# Patient Record
Sex: Male | Born: 1975 | Race: White | Hispanic: No | Marital: Married
Health system: Southern US, Academic
[De-identification: ages and names within clinical notes are randomized; demographics above are authoritative.]

## PROBLEM LIST (undated history)

## (undated) ENCOUNTER — Encounter

## (undated) ENCOUNTER — Ambulatory Visit

## (undated) ENCOUNTER — Telehealth

## (undated) ENCOUNTER — Encounter: Attending: Nephrology | Primary: Nephrology

## (undated) ENCOUNTER — Ambulatory Visit
Payer: PRIVATE HEALTH INSURANCE | Attending: Student in an Organized Health Care Education/Training Program | Primary: Student in an Organized Health Care Education/Training Program

## (undated) ENCOUNTER — Ambulatory Visit: Payer: PRIVATE HEALTH INSURANCE | Attending: Nephrology | Primary: Nephrology

## (undated) ENCOUNTER — Ambulatory Visit: Payer: PRIVATE HEALTH INSURANCE

## (undated) ENCOUNTER — Encounter: Payer: BLUE CROSS/BLUE SHIELD | Attending: Surgery | Primary: Surgery

## (undated) ENCOUNTER — Encounter: Attending: Surgery | Primary: Surgery

## (undated) ENCOUNTER — Encounter: Payer: BLUE CROSS/BLUE SHIELD | Attending: Nephrology | Primary: Nephrology

## (undated) ENCOUNTER — Ambulatory Visit: Payer: PRIVATE HEALTH INSURANCE | Attending: Dermatology | Primary: Dermatology

## (undated) ENCOUNTER — Ambulatory Visit: Attending: Nephrology | Primary: Nephrology

## (undated) ENCOUNTER — Ambulatory Visit
Payer: BLUE CROSS/BLUE SHIELD | Attending: Student in an Organized Health Care Education/Training Program | Primary: Student in an Organized Health Care Education/Training Program

## (undated) ENCOUNTER — Ambulatory Visit: Payer: BLUE CROSS/BLUE SHIELD

## (undated) ENCOUNTER — Ambulatory Visit: Attending: Surgery | Primary: Surgery

## (undated) ENCOUNTER — Ambulatory Visit: Payer: PRIVATE HEALTH INSURANCE | Attending: Physician Assistant | Primary: Physician Assistant

## (undated) ENCOUNTER — Ambulatory Visit
Attending: Student in an Organized Health Care Education/Training Program | Primary: Student in an Organized Health Care Education/Training Program

## (undated) ENCOUNTER — Non-Acute Institutional Stay: Payer: PRIVATE HEALTH INSURANCE

## (undated) ENCOUNTER — Encounter: Attending: Family | Primary: Family

## (undated) ENCOUNTER — Ambulatory Visit: Payer: BLUE CROSS/BLUE SHIELD | Attending: Nephrology | Primary: Nephrology

## (undated) ENCOUNTER — Encounter: Payer: BLUE CROSS/BLUE SHIELD | Attending: Urology | Primary: Urology

## (undated) ENCOUNTER — Ambulatory Visit: Payer: BLUE CROSS/BLUE SHIELD | Attending: Surgery | Primary: Surgery

## (undated) ENCOUNTER — Telehealth: Attending: Family Medicine | Primary: Family Medicine

## (undated) ENCOUNTER — Ambulatory Visit: Payer: BLUE CROSS/BLUE SHIELD | Attending: Dermatology | Primary: Dermatology

## (undated) ENCOUNTER — Encounter
Attending: Student in an Organized Health Care Education/Training Program | Primary: Student in an Organized Health Care Education/Training Program

## (undated) ENCOUNTER — Ambulatory Visit: Payer: BLUE CROSS/BLUE SHIELD | Attending: Urology | Primary: Urology

## (undated) DIAGNOSIS — Z72820 Sleep deprivation: Secondary | ICD-10-CM

## (undated) DIAGNOSIS — U071 COVID-19: Secondary | ICD-10-CM

## (undated) DIAGNOSIS — Q612 Polycystic kidney, adult type: Secondary | ICD-10-CM

## (undated) DIAGNOSIS — I10 Essential (primary) hypertension: Secondary | ICD-10-CM

## (undated) DIAGNOSIS — N529 Male erectile dysfunction, unspecified: Secondary | ICD-10-CM

## (undated) DIAGNOSIS — N185 Chronic kidney disease, stage 5: Secondary | ICD-10-CM

## (undated) DIAGNOSIS — D649 Anemia, unspecified: Secondary | ICD-10-CM

## (undated) DIAGNOSIS — F419 Anxiety disorder, unspecified: Secondary | ICD-10-CM

## (undated) DIAGNOSIS — Z87442 Personal history of urinary calculi: Secondary | ICD-10-CM

## (undated) DIAGNOSIS — Q613 Polycystic kidney, unspecified: Secondary | ICD-10-CM

## (undated) DIAGNOSIS — K219 Gastro-esophageal reflux disease without esophagitis: Secondary | ICD-10-CM

## (undated) DIAGNOSIS — D849 Immunodeficiency, unspecified: Secondary | ICD-10-CM

## (undated) HISTORY — PX: AV FISTULA PLACEMENT, BRACHIOCEPHALIC: SHX1207

## (undated) HISTORY — DX: Polycystic kidney, unspecified: Q61.3

## (undated) HISTORY — PX: NEPHRECTOMY LIVING DONOR: SUR877

## (undated) HISTORY — DX: Essential (primary) hypertension: I10

## (undated) HISTORY — PX: SMALL INTESTINE SURGERY: SHX150

## (undated) HISTORY — PX: APPENDECTOMY: SHX54

---
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
## 1996-01-24 HISTORY — PX: SMALL INTESTINE SURGERY: SHX150

## 2003-11-05 ENCOUNTER — Ambulatory Visit: Payer: Self-pay | Admitting: Family Medicine

## 2004-03-01 ENCOUNTER — Ambulatory Visit: Payer: Self-pay | Admitting: Urology

## 2004-03-07 ENCOUNTER — Ambulatory Visit: Payer: Self-pay | Admitting: Specialist

## 2006-11-14 ENCOUNTER — Ambulatory Visit: Payer: Self-pay | Admitting: Urology

## 2012-07-23 ENCOUNTER — Ambulatory Visit: Payer: Self-pay | Admitting: Nephrology

## 2012-11-12 ENCOUNTER — Ambulatory Visit: Payer: Self-pay | Admitting: Nephrology

## 2012-11-22 DIAGNOSIS — N200 Calculus of kidney: Secondary | ICD-10-CM | POA: Insufficient documentation

## 2012-11-28 ENCOUNTER — Ambulatory Visit: Payer: Self-pay | Admitting: Nephrology

## 2013-02-21 DIAGNOSIS — K4090 Unilateral inguinal hernia, without obstruction or gangrene, not specified as recurrent: Secondary | ICD-10-CM | POA: Insufficient documentation

## 2015-01-04 DIAGNOSIS — Q612 Polycystic kidney, adult type: Secondary | ICD-10-CM | POA: Insufficient documentation

## 2015-01-04 DIAGNOSIS — N184 Chronic kidney disease, stage 4 (severe): Secondary | ICD-10-CM

## 2015-01-04 DIAGNOSIS — I10 Essential (primary) hypertension: Secondary | ICD-10-CM

## 2015-01-04 DIAGNOSIS — I1311 Hypertensive heart and chronic kidney disease without heart failure, with stage 5 chronic kidney disease, or end stage renal disease: Secondary | ICD-10-CM | POA: Insufficient documentation

## 2015-01-04 DIAGNOSIS — Q613 Polycystic kidney, unspecified: Secondary | ICD-10-CM

## 2015-01-04 DIAGNOSIS — N185 Chronic kidney disease, stage 5: Secondary | ICD-10-CM | POA: Insufficient documentation

## 2015-01-04 DIAGNOSIS — N189 Chronic kidney disease, unspecified: Secondary | ICD-10-CM

## 2015-01-04 HISTORY — DX: Chronic kidney disease, unspecified: N18.9

## 2015-01-05 ENCOUNTER — Ambulatory Visit (INDEPENDENT_AMBULATORY_CARE_PROVIDER_SITE_OTHER): Payer: Managed Care, Other (non HMO) | Admitting: Unknown Physician Specialty

## 2015-01-05 ENCOUNTER — Encounter: Payer: Self-pay | Admitting: Unknown Physician Specialty

## 2015-01-05 VITALS — BP 123/83 | HR 65 | Temp 98.2°F | Ht 72.0 in | Wt 242.2 lb

## 2015-01-05 DIAGNOSIS — Q613 Polycystic kidney, unspecified: Secondary | ICD-10-CM

## 2015-01-05 DIAGNOSIS — Z0000 Encounter for general adult medical examination without abnormal findings: Secondary | ICD-10-CM | POA: Diagnosis not present

## 2015-01-05 DIAGNOSIS — I10 Essential (primary) hypertension: Secondary | ICD-10-CM | POA: Diagnosis not present

## 2015-01-05 DIAGNOSIS — N184 Chronic kidney disease, stage 4 (severe): Secondary | ICD-10-CM | POA: Diagnosis not present

## 2015-01-05 NOTE — Assessment & Plan Note (Signed)
Per nephrology 

## 2015-01-05 NOTE — Assessment & Plan Note (Signed)
Stable, continue present medications.   

## 2015-01-05 NOTE — Progress Notes (Signed)
   BP 123/83 mmHg  Pulse 65  Temp(Src) 98.2 F (36.8 C)  Ht 6' (1.829 m)  Wt 242 lb 3.2 oz (109.861 kg)  BMI 32.84 kg/m2  SpO2 98%   Subjective:    Patient ID: Scott Hickman, male    DOB: [birth year], 39 y.o.   MRN: [id number]  HPI: Scott Hickman is a 39 y.o. male  Chief Complaint  Patient presents with  . Annual Exam   Needs a mandatory physical for work.    Relevant past medical, surgical, family and social history reviewed and updated as indicated. Interim medical history since our last visit reviewed. Allergies and medications reviewed and updated.  Review of Systems  Constitutional: Negative.   HENT: Negative.   Eyes: Negative.   Respiratory: Negative.   Cardiovascular: Negative.   Gastrointestinal: Negative.   Endocrine: Negative.   Genitourinary: Negative.   Skin: Negative.   Allergic/Immunologic: Negative.   Neurological: Negative.   Hematological: Negative.   Psychiatric/Behavioral: Negative.     Per HPI unless specifically indicated above     Objective:    BP 123/83 mmHg  Pulse 65  Temp(Src) 98.2 F (36.8 C)  Ht 6' (1.829 m)  Wt 242 lb 3.2 oz (109.861 kg)  BMI 32.84 kg/m2  SpO2 98%  Wt Readings from Last 3 Encounters:  01/05/15 242 lb 3.2 oz (109.861 kg)  12/04/13 235 lb (106.595 kg)    Physical Exam  Constitutional: He is oriented to person, place, and time. He appears well-developed and well-nourished.  HENT:  Head: Normocephalic.  Right Ear: Tympanic membrane, external ear and ear canal normal.  Left Ear: Tympanic membrane, external ear and ear canal normal.  Mouth/Throat: Uvula is midline, oropharynx is clear and moist and mucous membranes are normal.  Eyes: Pupils are equal, round, and reactive to light.  Cardiovascular: Normal rate, regular rhythm and normal heart sounds.  Exam reveals no gallop and no friction rub.   No murmur heard. Pulmonary/Chest: Effort normal and breath sounds normal. No respiratory distress.  Abdominal: Soft.  Bowel sounds are normal. He exhibits no distension. There is no tenderness.  Musculoskeletal: Normal range of motion.  Neurological: He is alert and oriented to person, place, and time. He has normal reflexes.  Skin: Skin is warm and dry.  Psychiatric: He has a normal mood and affect. His behavior is normal. Judgment and thought content normal.    No results found for this or any previous visit.    Assessment & Plan:   Problem List Items Addressed This Visit      Unprioritized   Polycystic kidney    Per nephrology      Hypertension    Stable, continue present medications.        Chronic kidney disease (CKD), stage IV (severe) [facility])    Per nephrology       Other Visit Diagnoses    Annual physical exam    -  Primary    Relevant Orders    TSH    Lipid Panel w/o Chol/HDL Ratio    CBC       Nephrologist following BP and kidney function  Follow up plan: Return in about 1 year (around 01/05/2016).

## 2015-01-06 ENCOUNTER — Encounter: Payer: Self-pay | Admitting: Unknown Physician Specialty

## 2015-01-06 LAB — CBC
HEMATOCRIT: 40 % (ref 37.5–51.0)
Hemoglobin: 13.3 g/dL (ref 12.6–17.7)
MCH: 26.9 pg (ref 26.6–33.0)
MCHC: 33.3 g/dL (ref 31.5–35.7)
MCV: 81 fL (ref 79–97)
PLATELETS: 245 10*3/uL (ref 150–379)
RBC: 4.94 x10E6/uL (ref 4.14–5.80)
RDW: 14.2 % (ref 12.3–15.4)
WBC: 8.4 10*3/uL (ref 3.4–10.8)

## 2015-01-06 LAB — LIPID PANEL W/O CHOL/HDL RATIO
Cholesterol, Total: 156 mg/dL (ref 100–199)
HDL: 36 mg/dL — ABNORMAL LOW (ref >39)
LDL CALC: 93 mg/dL (ref 0–99)
Triglycerides: 136 mg/dL (ref 0–149)
VLDL Cholesterol Cal: 27 mg/dL (ref 5–40)

## 2015-01-06 LAB — TSH: TSH: 1.19 u[IU]/mL (ref 0.450–4.500)

## 2015-03-02 ENCOUNTER — Ambulatory Visit: Payer: Managed Care, Other (non HMO) | Admitting: Unknown Physician Specialty

## 2016-01-06 ENCOUNTER — Encounter: Payer: Self-pay | Admitting: Family Medicine

## 2016-01-06 ENCOUNTER — Ambulatory Visit (INDEPENDENT_AMBULATORY_CARE_PROVIDER_SITE_OTHER): Payer: Managed Care, Other (non HMO) | Admitting: Family Medicine

## 2016-01-06 VITALS — BP 145/91 | HR 85 | Temp 98.1°F | Ht 71.5 in | Wt 247.0 lb

## 2016-01-06 DIAGNOSIS — Z0000 Encounter for general adult medical examination without abnormal findings: Secondary | ICD-10-CM

## 2016-01-06 DIAGNOSIS — N184 Chronic kidney disease, stage 4 (severe): Secondary | ICD-10-CM | POA: Diagnosis not present

## 2016-01-06 DIAGNOSIS — I10 Essential (primary) hypertension: Secondary | ICD-10-CM | POA: Diagnosis not present

## 2016-01-06 DIAGNOSIS — Z0001 Encounter for general adult medical examination with abnormal findings: Secondary | ICD-10-CM | POA: Diagnosis not present

## 2016-01-06 NOTE — Assessment & Plan Note (Signed)
Followed by [facility]

## 2016-01-06 NOTE — Assessment & Plan Note (Signed)
Blood pressures elevated here again patient will check blood pressure at home as has appointments follow-up at [facility] in early next year.

## 2016-01-06 NOTE — Progress Notes (Addendum)
BP (!) 145/91 (BP Location: Left Arm, Patient Position: Sitting, Cuff Size: Normal)   Pulse 85   Temp 98.1 F (36.7 C)   Ht 5' 11.5" (1.816 m)   Wt 247 lb (112 kg)   SpO2 99%   BMI 33.97 kg/m    Subjective:    Patient ID: Scott Hickman, male    DOB: Feb 17, 1975, 40 y.o.   MRN: [id number]  HPI: Scott Hickman is a 40 y.o. male  Chief Complaint  Patient presents with  . Annual Exam  Patient follow-up [facility] probably will be on transplant list on blood pressure has been up and down was very low and stopped metoprolol earlier this summer blood pressures been okay on home monitoring but hasn't checked lately he has been good.  Relevant past medical, surgical, family and social history reviewed and updated as indicated. Interim medical history since our last visit reviewed. Allergies and medications reviewed and updated.  Review of Systems  Constitutional: Negative.   HENT: Negative.   Eyes: Negative.   Respiratory: Negative.   Cardiovascular: Negative.   Gastrointestinal: Negative.   Endocrine: Negative.   Genitourinary: Negative.   Musculoskeletal: Negative.   Skin: Negative.   Allergic/Immunologic: Negative.   Neurological: Negative.   Hematological: Negative.   Psychiatric/Behavioral: Negative.     Per HPI unless specifically indicated above     Objective:    BP (!) 145/91 (BP Location: Left Arm, Patient Position: Sitting, Cuff Size: Normal)   Pulse 85   Temp 98.1 F (36.7 C)   Ht 5' 11.5" (1.816 m)   Wt 247 lb (112 kg)   SpO2 99%   BMI 33.97 kg/m   Wt Readings from Last 3 Encounters:  01/06/16 247 lb (112 kg)  01/05/15 242 lb 3.2 oz (109.9 kg)  12/04/13 235 lb (106.6 kg)    Physical Exam  Constitutional: He is oriented to person, place, and time. He appears well-developed and well-nourished.  HENT:  Head: Normocephalic and atraumatic.  Right Ear: External ear normal.  Left Ear: External ear normal.  Eyes: Conjunctivae and EOM are normal. Pupils are  equal, round, and reactive to light.  Neck: Normal range of motion. Neck supple.  Cardiovascular: Normal rate, regular rhythm, normal heart sounds and intact distal pulses.   Pulmonary/Chest: Effort normal and breath sounds normal.  Abdominal: Soft. Bowel sounds are normal. There is no splenomegaly or hepatomegaly.  Genitourinary: Rectum normal, prostate normal and penis normal.  Musculoskeletal: Normal range of motion.  Neurological: He is alert and oriented to person, place, and time. He has normal reflexes.  Skin: No rash noted. No erythema.  Psychiatric: He has a normal mood and affect. His behavior is normal. Judgment and thought content normal.    Results for orders placed or performed in visit on 01/05/15  TSH  Result Value Ref Range   TSH 1.190 0.450 - 4.500 uIU/mL  Lipid Panel w/o Chol/HDL Ratio  Result Value Ref Range   Cholesterol, Total 156 100 - 199 mg/dL   Triglycerides 136 0 - 149 mg/dL   HDL 36 (L) >39 mg/dL   VLDL Cholesterol Cal 27 5 - 40 mg/dL   LDL Calculated 93 0 - 99 mg/dL  CBC  Result Value Ref Range   WBC 8.4 3.4 - 10.8 x10E3/uL   RBC 4.94 4.14 - 5.80 x10E6/uL   Hemoglobin 13.3 12.6 - 17.7 g/dL   Hematocrit 40.0 37.5 - 51.0 %   MCV 81 79 - 97 fL   MCH  26.9 26.6 - 33.0 pg   MCHC 33.3 31.5 - 35.7 g/dL   RDW 14.2 12.3 - 15.4 %   Platelets 245 150 - 379 x10E3/uL      Assessment & Plan:   Problem List Items Addressed This Visit      Cardiovascular and Mediastinum   Hypertension    Blood pressures elevated here again patient will check blood pressure at home as has appointments follow-up at [facility] in early next year.        Genitourinary   Chronic kidney disease (CKD), stage IV (severe) ([place])    Followed by [facility]       Other Visit Diagnoses    PE (physical exam), annual    -  Primary   Relevant Orders   Comprehensive metabolic panel   Lipid panel   CBC with Differential/Platelet   TSH       Follow up plan: Return in about 1 year (around  01/05/2017) for Physical Exam.

## 2016-01-07 LAB — COMPREHENSIVE METABOLIC PANEL
A/G RATIO: 1.6 (ref 1.2–2.2)
ALK PHOS: 86 IU/L (ref 39–117)
ALT: 23 IU/L (ref 0–44)
AST: 14 IU/L (ref 0–40)
Albumin: 4.5 g/dL (ref 3.5–5.5)
BILIRUBIN TOTAL: 0.2 mg/dL (ref 0.0–1.2)
BUN/Creatinine Ratio: 10 (ref 9–20)
BUN: 37 mg/dL — ABNORMAL HIGH (ref 6–24)
CALCIUM: 9 mg/dL (ref 8.7–10.2)
CHLORIDE: 107 mmol/L — AB (ref 96–106)
CO2: 20 mmol/L (ref 18–29)
Creatinine, Ser: 3.66 mg/dL — ABNORMAL HIGH (ref 0.76–1.27)
GFR calc Af Amer: 23 mL/min/{1.73_m2} — ABNORMAL LOW (ref >59)
GFR calc non Af Amer: 20 mL/min/{1.73_m2} — ABNORMAL LOW (ref >59)
Globulin, Total: 2.8 g/dL (ref 1.5–4.5)
Glucose: 101 mg/dL — ABNORMAL HIGH (ref 65–99)
POTASSIUM: 4.9 mmol/L (ref 3.5–5.2)
SODIUM: 143 mmol/L (ref 134–144)
Total Protein: 7.3 g/dL (ref 6.0–8.5)

## 2016-01-07 LAB — CBC WITH DIFFERENTIAL/PLATELET
Basophils Absolute: 0 10*3/uL (ref 0.0–0.2)
Basos: 0 %
EOS (ABSOLUTE): 0.2 10*3/uL (ref 0.0–0.4)
EOS: 3 %
HEMATOCRIT: 38.4 % (ref 37.5–51.0)
HEMOGLOBIN: 12.3 g/dL — AB (ref 13.0–17.7)
IMMATURE GRANS (ABS): 0 10*3/uL (ref 0.0–0.1)
Immature Granulocytes: 0 %
LYMPHS ABS: 2.2 10*3/uL (ref 0.7–3.1)
LYMPHS: 27 %
MCH: 26.7 pg (ref 26.6–33.0)
MCHC: 32 g/dL (ref 31.5–35.7)
MCV: 84 fL (ref 79–97)
MONOCYTES: 6 %
Monocytes Absolute: 0.5 10*3/uL (ref 0.1–0.9)
NEUTROS ABS: 5.1 10*3/uL (ref 1.4–7.0)
Neutrophils: 64 %
Platelets: 245 10*3/uL (ref 150–379)
RBC: 4.6 x10E6/uL (ref 4.14–5.80)
RDW: 14.3 % (ref 12.3–15.4)
WBC: 8 10*3/uL (ref 3.4–10.8)

## 2016-01-07 LAB — LIPID PANEL
CHOLESTEROL TOTAL: 168 mg/dL (ref 100–199)
Chol/HDL Ratio: 3.7 ratio units (ref 0.0–5.0)
HDL: 45 mg/dL (ref >39)
LDL Calculated: 103 mg/dL — ABNORMAL HIGH (ref 0–99)
TRIGLYCERIDES: 98 mg/dL (ref 0–149)
VLDL Cholesterol Cal: 20 mg/dL (ref 5–40)

## 2016-01-07 LAB — TSH: TSH: 2.27 u[IU]/mL (ref 0.450–4.500)

## 2016-01-10 ENCOUNTER — Encounter: Payer: Self-pay | Admitting: Family Medicine

## 2016-02-04 ENCOUNTER — Telehealth: Payer: Self-pay | Admitting: Family Medicine

## 2016-02-04 DIAGNOSIS — N185 Chronic kidney disease, stage 5: Secondary | ICD-10-CM

## 2016-02-04 NOTE — Telephone Encounter (Signed)
Patient's wife Claiborne Billings) states that her husband Scott Hickman needs a TB skin test in order to be added to a kidney transplant list. Patient's wife would like to know if this requires an office visit or if you can just put in an order for the patient to have the lab test?  Please call to advise.

## 2016-02-07 NOTE — Telephone Encounter (Signed)
Pt had annual exam done on 01/06/16. Please advise.

## 2016-02-07 NOTE — Telephone Encounter (Signed)
New appointment needed orders placed patient just needs to come in for labs.

## 2016-02-07 NOTE — Telephone Encounter (Signed)
Wife aware. Lab appointment made.

## 2016-02-08 ENCOUNTER — Other Ambulatory Visit: Payer: Managed Care, Other (non HMO)

## 2016-02-08 ENCOUNTER — Encounter: Payer: Self-pay | Admitting: Family Medicine

## 2016-02-08 DIAGNOSIS — N185 Chronic kidney disease, stage 5: Secondary | ICD-10-CM

## 2016-02-11 ENCOUNTER — Encounter: Payer: Self-pay | Admitting: Family Medicine

## 2016-02-11 LAB — QUANTIFERON IN TUBE
QFT TB AG MINUS NIL VALUE: <0 IU/mL
QUANTIFERON MITOGEN VALUE: 7.73 [IU]/mL
QUANTIFERON NIL VALUE: 0.04 [IU]/mL
QUANTIFERON TB AG VALUE: 0.03 [IU]/mL
QUANTIFERON TB GOLD: NEGATIVE

## 2016-02-11 LAB — QUANTIFERON TB GOLD ASSAY (BLOOD)

## 2016-07-24 ENCOUNTER — Ambulatory Visit
Admission: RE | Admit: 2016-07-24 | Discharge: 2016-07-24 | Disposition: A | Discharge status: Home / Self Care | Payer: PRIVATE HEALTH INSURANCE | Attending: Nephrology | Admitting: Nephrology

## 2016-07-24 DIAGNOSIS — E872 Acidosis: Secondary | ICD-10-CM

## 2016-07-24 DIAGNOSIS — N185 Chronic kidney disease, stage 5: Secondary | ICD-10-CM

## 2016-07-24 DIAGNOSIS — N2581 Secondary hyperparathyroidism of renal origin: Secondary | ICD-10-CM

## 2016-07-24 DIAGNOSIS — Q612 Polycystic kidney, adult type: Principal | ICD-10-CM

## 2016-07-24 DIAGNOSIS — I10 Essential (primary) hypertension: Secondary | ICD-10-CM

## 2016-09-21 ENCOUNTER — Ambulatory Visit
Admission: RE | Admit: 2016-09-21 | Discharge: 2016-09-21 | Disposition: A | Discharge status: Home / Self Care | Payer: Commercial Managed Care - PPO

## 2016-09-21 DIAGNOSIS — N2581 Secondary hyperparathyroidism of renal origin: Secondary | ICD-10-CM

## 2016-09-21 DIAGNOSIS — Z7682 Awaiting organ transplant status: Secondary | ICD-10-CM

## 2016-09-21 DIAGNOSIS — N185 Chronic kidney disease, stage 5: Principal | ICD-10-CM

## 2016-09-27 ENCOUNTER — Ambulatory Visit
Admission: RE | Admit: 2016-09-27 | Discharge: 2016-09-27 | Disposition: A | Discharge status: Home / Self Care | Payer: Commercial Managed Care - PPO

## 2016-09-27 DIAGNOSIS — N185 Chronic kidney disease, stage 5: Principal | ICD-10-CM

## 2016-11-17 ENCOUNTER — Ambulatory Visit
Admission: RE | Admit: 2016-11-17 | Discharge: 2016-11-17 | Disposition: A | Discharge status: Home / Self Care | Payer: Commercial Managed Care - PPO

## 2016-11-17 DIAGNOSIS — I10 Essential (primary) hypertension: Secondary | ICD-10-CM

## 2016-11-17 DIAGNOSIS — Q612 Polycystic kidney, adult type: Secondary | ICD-10-CM

## 2016-11-17 DIAGNOSIS — N185 Chronic kidney disease, stage 5: Principal | ICD-10-CM

## 2016-11-20 ENCOUNTER — Ambulatory Visit
Admission: RE | Admit: 2016-11-20 | Discharge: 2016-11-20 | Payer: Commercial Managed Care - PPO | Attending: Nephrology | Admitting: Nephrology

## 2016-11-20 DIAGNOSIS — I10 Essential (primary) hypertension: Principal | ICD-10-CM

## 2016-11-20 DIAGNOSIS — Q612 Polycystic kidney, adult type: Secondary | ICD-10-CM

## 2016-11-20 DIAGNOSIS — E872 Acidosis: Secondary | ICD-10-CM

## 2016-11-20 DIAGNOSIS — N185 Chronic kidney disease, stage 5: Secondary | ICD-10-CM

## 2016-11-20 DIAGNOSIS — N2581 Secondary hyperparathyroidism of renal origin: Secondary | ICD-10-CM

## 2016-11-20 MED ORDER — AMLODIPINE 10 MG TABLET
ORAL_TABLET | Freq: Every day | ORAL | 4 refills | 0.00000 days | Status: CP
Start: 2016-11-20 — End: 2018-02-12

## 2016-11-20 MED ORDER — CARVEDILOL 3.125 MG TABLET
ORAL_TABLET | Freq: Two times a day (BID) | ORAL | 3 refills | 0 days | Status: CP
Start: 2016-11-20 — End: 2017-06-14

## 2016-11-20 MED ORDER — CALCITRIOL 0.25 MCG CAPSULE
ORAL_CAPSULE | ORAL | 11 refills | 0 days | Status: CP
Start: 2016-11-20 — End: 2017-03-22

## 2016-11-20 MED ORDER — SODIUM BICARBONATE 650 MG TABLET
ORAL_TABLET | Freq: Two times a day (BID) | ORAL | 3 refills | 0.00000 days | Status: CP
Start: 2016-11-20 — End: 2017-12-05

## 2016-12-21 ENCOUNTER — Ambulatory Visit
Admission: RE | Admit: 2016-12-21 | Discharge: 2016-12-21 | Disposition: A | Discharge status: Home / Self Care | Payer: Commercial Managed Care - PPO

## 2016-12-21 DIAGNOSIS — N185 Chronic kidney disease, stage 5: Principal | ICD-10-CM

## 2016-12-28 ENCOUNTER — Ambulatory Visit
Admission: RE | Admit: 2016-12-28 | Discharge: 2016-12-28 | Payer: Commercial Managed Care - PPO | Attending: Nephrology | Admitting: Nephrology

## 2016-12-28 DIAGNOSIS — D631 Anemia in chronic kidney disease: Secondary | ICD-10-CM

## 2016-12-28 DIAGNOSIS — Q612 Polycystic kidney, adult type: Secondary | ICD-10-CM

## 2016-12-28 DIAGNOSIS — I10 Essential (primary) hypertension: Principal | ICD-10-CM

## 2016-12-28 DIAGNOSIS — E872 Acidosis: Secondary | ICD-10-CM

## 2016-12-28 DIAGNOSIS — N185 Chronic kidney disease, stage 5: Secondary | ICD-10-CM

## 2016-12-28 DIAGNOSIS — N189 Chronic kidney disease, unspecified: Secondary | ICD-10-CM

## 2017-01-11 ENCOUNTER — Encounter: Payer: Managed Care, Other (non HMO) | Admitting: Family Medicine

## 2017-02-09 ENCOUNTER — Encounter: Admit: 2017-02-09 | Discharge: 2017-02-10 | Payer: BLUE CROSS/BLUE SHIELD

## 2017-02-09 DIAGNOSIS — N185 Chronic kidney disease, stage 5: Principal | ICD-10-CM

## 2017-02-14 ENCOUNTER — Encounter: Admit: 2017-02-14 | Discharge: 2017-02-14 | Payer: BLUE CROSS/BLUE SHIELD | Attending: Nephrology | Primary: Nephrology

## 2017-02-14 DIAGNOSIS — Z01818 Encounter for other preprocedural examination: Principal | ICD-10-CM

## 2017-02-15 ENCOUNTER — Encounter: Admit: 2017-02-15 | Discharge: 2017-02-16 | Payer: BLUE CROSS/BLUE SHIELD | Attending: Nephrology | Primary: Nephrology

## 2017-02-15 DIAGNOSIS — N185 Chronic kidney disease, stage 5: Principal | ICD-10-CM

## 2017-03-02 MED ORDER — ISOSORBIDE MONONITRATE ER 30 MG TABLET,EXTENDED RELEASE 24 HR
ORAL_TABLET | Freq: Every day | ORAL | 11 refills | 0 days | Status: CP
Start: 2017-03-02 — End: 2017-03-22

## 2017-03-15 ENCOUNTER — Encounter: Admit: 2017-03-15 | Discharge: 2017-03-16 | Payer: BLUE CROSS/BLUE SHIELD

## 2017-03-15 DIAGNOSIS — N185 Chronic kidney disease, stage 5: Principal | ICD-10-CM

## 2017-03-22 ENCOUNTER — Encounter: Admit: 2017-03-22 | Discharge: 2017-03-23 | Payer: BLUE CROSS/BLUE SHIELD | Attending: Nephrology | Primary: Nephrology

## 2017-03-22 DIAGNOSIS — I150 Renovascular hypertension: Secondary | ICD-10-CM

## 2017-03-22 DIAGNOSIS — E872 Acidosis: Secondary | ICD-10-CM

## 2017-03-22 DIAGNOSIS — N185 Chronic kidney disease, stage 5: Secondary | ICD-10-CM

## 2017-03-22 DIAGNOSIS — N2581 Secondary hyperparathyroidism of renal origin: Principal | ICD-10-CM

## 2017-03-22 DIAGNOSIS — I10 Essential (primary) hypertension: Secondary | ICD-10-CM

## 2017-03-22 MED ORDER — HYDRALAZINE 25 MG TABLET
ORAL_TABLET | Freq: Three times a day (TID) | ORAL | 11 refills | 0 days | Status: CP
Start: 2017-03-22 — End: 2017-06-14

## 2017-03-22 MED ORDER — CALCITRIOL 0.25 MCG CAPSULE
ORAL_CAPSULE | Freq: Every day | ORAL | 11 refills | 0 days | Status: CP
Start: 2017-03-22 — End: 2017-10-18

## 2017-04-05 ENCOUNTER — Encounter: Admit: 2017-04-05 | Discharge: 2017-04-06 | Payer: BLUE CROSS/BLUE SHIELD | Attending: Surgery | Primary: Surgery

## 2017-04-05 DIAGNOSIS — N186 End stage renal disease: Secondary | ICD-10-CM

## 2017-04-05 DIAGNOSIS — Z7289 Other problems related to lifestyle: Secondary | ICD-10-CM

## 2017-04-05 DIAGNOSIS — Z125 Encounter for screening for malignant neoplasm of prostate: Secondary | ICD-10-CM

## 2017-04-05 DIAGNOSIS — Z01810 Encounter for preprocedural cardiovascular examination: Secondary | ICD-10-CM

## 2017-04-05 DIAGNOSIS — Z01818 Encounter for other preprocedural examination: Principal | ICD-10-CM

## 2017-04-16 ENCOUNTER — Ambulatory Visit (INDEPENDENT_AMBULATORY_CARE_PROVIDER_SITE_OTHER): Payer: PRIVATE HEALTH INSURANCE

## 2017-04-16 ENCOUNTER — Ambulatory Visit
Admission: EM | Admit: 2017-04-16 | Discharge: 2017-04-16 | Disposition: A | Discharge status: Home / Self Care | Payer: PRIVATE HEALTH INSURANCE | Attending: Family Medicine | Admitting: Family Medicine

## 2017-04-16 ENCOUNTER — Encounter: Payer: Self-pay | Admitting: Emergency Medicine

## 2017-04-16 ENCOUNTER — Other Ambulatory Visit: Payer: Self-pay

## 2017-04-16 DIAGNOSIS — M7021 Olecranon bursitis, right elbow: Secondary | ICD-10-CM

## 2017-04-16 MED ORDER — PREDNISONE 10 MG (21) PO TBPK: ORAL_TABLET | Freq: Every day | ORAL | 0 refills | Status: DC

## 2017-04-16 NOTE — Discharge Instructions (Signed)
Prednisone as prescribed. ° °Take care ° °Dr. Damel Querry  °

## 2017-04-16 NOTE — ED Triage Notes (Signed)
Patient c/o right elbow pain and swelling for the past 2 weeks.

## 2017-04-16 NOTE — ED Provider Notes (Signed)
[facility]    CSN: [id number] Arrival date & time: 04/16/17  4098  History   Chief Complaint Chief Complaint  Patient presents with  . Elbow Pain   HPI  42 year old male presents with right elbow pain and swelling.  Patient reports a 2-week history of right elbow pain and swelling.  Pain is minimal currently.  He is primarily bothered by the swelling.  She was doing some work recently and felt a pop.  He does not recall any trauma.  Continues to have swelling.  No redness.  No fever.  No other associated symptoms.  Patient has not taken any medication as he has polycystic kidney disease.  He cannot take anti-inflammatories.  No other associated symptoms.  No other complaints or concerns at this time.  Past Medical History:  Diagnosis Date  . Hypertension   . Polycystic kidney     Patient Active Problem List   Diagnosis Date Noted  . Polycystic kidney 01/04/2015  . Hypertension 01/04/2015  . Chronic kidney disease (CKD), stage IV (severe) ([place]) 01/04/2015    Past Surgical History:  Procedure Laterality Date  . APPENDECTOMY    . SMALL INTESTINE SURGERY      Home Medications    Prior to Admission medications   Medication Sig Start Date End Date Taking? Authorizing Provider  amLODipine (NORVASC) 10 MG tablet Take 10 mg by mouth daily.   Yes [provider]  carvedilol (COREG) 3.125 MG tablet Take 1 tablet by mouth 2 (two) times daily. 02/21/17  Yes [provider]  carvedilol (COREG) 3.125 MG tablet Take 3.125 mg by mouth. 11/20/16 11/20/17 Yes [provider]  citalopram (CELEXA) 20 MG tablet Take 20 mg by mouth daily. 05/15/16 05/15/17 Yes [provider]  doxycycline (VIBRAMYCIN) 100 MG capsule Take 1 capsule by mouth 2 (two) times daily with a meal. 02/01/17  Yes [provider]  hydrALAZINE (APRESOLINE) 25 MG tablet Take 1 tablet by mouth 3 (three) times daily. 03/22/17  Yes [provider]  predniSONE  (STERAPRED UNI-PAK 21 TAB) 10 MG (21) TBPK tablet Take by mouth daily. Take 6 tabs by mouth daily  for 2 days, then 5 tabs for 2 days, then 4 tabs for 2 days, then 3 tabs for 2 days, 2 tabs for 2 days, then 1 tab by mouth daily for 2 days 04/16/17   Thersa Salt G, DO  sodium bicarbonate 325 MG tablet Take 325 mg by mouth 2 (two) times daily.    [provider]    Family History Family History  Problem Relation Age of Onset  . Kidney disease Father     Social History Social History   Tobacco Use  . Smoking status: Former Research scientist (life sciences)  . Smokeless tobacco: Former Network engineer Use Topics  . Alcohol use: No    Alcohol/week: 0.0 oz  . Drug use: No     Allergies   Benazepril; Biaxin [clarithromycin]; and Penicillins   Review of Systems Review of Systems  Constitutional: Negative.   Musculoskeletal:       Right elbow swelling, pain.  Skin: Negative.    Physical Exam Triage Vital Signs ED Triage Vitals  Enc Vitals Group     BP 04/16/17 1017 (!) 145/100     Pulse Rate 04/16/17 1017 68     Resp 04/16/17 1017 16     Temp 04/16/17 1017 98.2 F (36.8 C)     Temp Source 04/16/17 1017 Oral     SpO2  04/16/17 1017 100 %     Weight 04/16/17 1012 240 lb (108.9 kg)     Height 04/16/17 1012 6' (1.829 m)     Head Circumference --      Peak Flow --      Pain Score 04/16/17 1012 0     Pain Loc --      Pain Edu? --      Excl. in [place]? --    Updated Vital Signs BP (!) 145/100 (BP Location: Left Arm)   Pulse 68   Temp 98.2 F (36.8 C) (Oral)   Resp 16   Ht 6' (1.829 m)   Wt 240 lb (108.9 kg)   SpO2 100%   BMI 32.55 kg/m   Physical Exam  Constitutional: He is oriented to person, place, and time. He appears well-developed. No distress.  Cardiovascular: Normal rate and regular rhythm.  Pulmonary/Chest: Effort normal and breath sounds normal.  Musculoskeletal:  Right elbow -  swelling noted at the olecranon.  No surrounding erythema.  Slightly tender to palpation.    Neurological: He is alert and oriented to person, place, and time.  Skin: Skin is warm. No rash noted. No erythema.  Psychiatric: He has a normal mood and affect. His behavior is normal.  Nursing note and vitals reviewed.  UC Treatments / Results  Labs (all labs ordered are listed, but only abnormal results are displayed) Labs Reviewed - No data to display  EKG None Radiology Dg Elbow Complete Right  Result Date: 04/16/2017 CLINICAL DATA:  Pain and swelling of the right elbow for 2 weeks. EXAM: RIGHT ELBOW - COMPLETE 3+ VIEW COMPARISON:  None. FINDINGS: The joint spaces are maintained. No degenerative changes or osteochondral abnormality. No elbow joint effusion. There is a small olecranon spur and some surrounding soft tissue swelling/edema. Possible olecranon bursitis. IMPRESSION: No acute bony findings, degenerative changes or joint effusion. Small olecranon spur and possible olecranon bursitis. Electronically Signed   By: Marijo Sanes M.D.   On: 04/16/2017 11:06    Procedures Join Aspiration/Injection Date/Time: 04/16/2017 11:26 AM Performed by: Coral Spikes, DO Authorized by: Coral Spikes, DO   Consent:    Consent obtained:  Verbal   Consent given by:  Patient Location:    Location:  Elbow Anesthesia (see MAR for exact dosages):    Anesthesia method:  Local infiltration   Local anesthetic:  Lidocaine 1% w/o epi Procedure details:    Preparation: Patient was prepped and draped in usual sterile fashion     Needle gauge:  18 G   Aspirate characteristics:  Bloody   Steroid injected: no   Post-procedure details:    Dressing: Pressure dressing.   Patient tolerance of procedure:  Tolerated well, no immediate complications   (including critical care time)  Medications Ordered in UC Medications - No data to display   Initial Impression / Assessment and Plan / UC Course  I have reviewed the triage vital signs and the nursing notes.  Pertinent labs & imaging results  that were available during my care of the patient were reviewed by me and considered in my medical decision making (see chart for details).     42 year old male presents with olecranon bursitis.  X-ray was obtained and revealed findings consistent with a ligament bursitis.  Aspiration performed today per patient request.  We did talk about recurrence.  Treating with prednisone.  Advised continued compression.  Final Clinical Impressions(s) / UC Diagnoses   Final diagnoses:  Olecranon bursitis of  right elbow    ED Discharge Orders        Ordered    predniSONE (STERAPRED UNI-PAK 21 TAB) 10 MG (21) TBPK tablet  Daily     04/16/17 1112     Controlled Substance Prescriptions [place] Controlled Substance Registry consulted? Not Applicable   Coral Spikes, DO 04/16/17 1128

## 2017-04-20 ENCOUNTER — Ambulatory Visit: Payer: PRIVATE HEALTH INSURANCE | Admitting: Unknown Physician Specialty

## 2017-04-20 ENCOUNTER — Encounter: Payer: Self-pay | Admitting: Unknown Physician Specialty

## 2017-04-20 VITALS — BP 144/87 | HR 65 | Temp 98.3°F | Ht 71.5 in | Wt 245.3 lb

## 2017-04-20 DIAGNOSIS — M7021 Olecranon bursitis, right elbow: Secondary | ICD-10-CM | POA: Diagnosis not present

## 2017-04-20 MED ORDER — TRIAMCINOLONE ACETONIDE 40 MG/ML IJ SUSP
40.0000 mg | Freq: Once | INTRAMUSCULAR | Status: AC
  Administered 2017-04-20: 40 mg via INTRAMUSCULAR

## 2017-04-20 NOTE — Patient Instructions (Signed)
Elbow Bursitis Elbow bursitis is inflammation of the fluid-filled sac (bursa) between the tip of your elbow bone (olecranon) and your skin. Elbow bursitis may also be called olecranon bursitis. Normally, the olecranon bursa has only a small amount of fluid in it to cushion and protect your elbow bone. Elbow bursitis causes fluid to build up inside the bursa. Over time, this swelling and inflammation can cause pain when you bend or lean on your elbow. What are the causes? Elbow bursitis may be caused by:  Elbow injury (acute trauma).  Leaning on hard surfaces for long periods of time.  Infection from an injury that breaks the skin near your elbow.  A bone growth (spur) that forms at the tip of your elbow.  A medical condition that causes inflammation in your body, such as gout or rheumatoid arthritis.  The cause may also be unknown. What are the signs or symptoms? The first sign of elbow bursitis is usually swelling over the tip of your elbow. This can grow to be the size of a golf ball. This may start suddenly or develop gradually. You may also have:  Pain when bending or leaning on your elbow.  Restricted movement of your elbow.  If your bursitis is caused by an infection, symptoms may also include:  Redness, warmth, and tenderness of the elbow.  Drainage of pus from the swollen area over your elbow, if the skin breaks open.  How is this diagnosed? Your health care provider may be able to diagnose elbow bursitis based on your signs and symptoms, especially if you have recently been injured. Your health care provider will also do a physical exam. This may include:  X-rays to look for a bone spur or a bone fracture.  Draining fluid from the bursa to test it for infection.  Blood tests to rule out gout or rheumatoid arthritis.  How is this treated? Treatment for elbow bursitis depends on the cause. Treatment may include:  Medicines. These may include: ? Over-the-counter  medicines to relieve pain and inflammation. ? Antibiotic medicines to fight infection. ? Injections of anti-inflammatory medicines (steroids).  Wrapping your elbow with a bandage.  Draining fluid from the bursa.  Wearing elbow pads.  If your bursitis does not get better with treatment, surgery may be needed to remove the bursa. Follow these instructions at home:  Take medicines only as directed by your health care provider.  If you were prescribed an antibiotic medicine, finish all of it even if you start to feel better.  If your bursitis is caused by an injury, rest your elbow and wear your bandage as directed by your health care provider. You may alsoapply ice to the injured area as directed by your health care provider: ? Put ice in a plastic bag. ? Place a towel between your skin and the bag. ? Leave the ice on for 20 minutes, 2-3 times per day.  Avoid any activities that cause elbow pain.  Use elbow pads or elbow wraps to cushion your elbow. Contact a health care provider if:  You have a fever.  Your symptoms do not get better with treatment.  Your pain or swelling gets worse.  Your elbow pain or swelling goes away and then returns.  You have drainage of pus from the swollen area over your elbow. This information is not intended to replace advice given to you by your health care provider. Make sure you discuss any questions you have with your health care provider. Document Released:   02/08/2006 Document Revised: 06/17/2015 Document Reviewed: 09/17/2013 Elsevier Interactive Patient Education  2018 [employer].  Elbow Bursitis Rehab Ask your health care provider which exercises are safe for you. Do exercises exactly as told by your health care provider and adjust them as directed. It is normal to feel mild stretching, pulling, tightness, or discomfort as you do these exercises, but you should stop right away if you feel sudden pain or your pain gets worse. Do not begin  these exercises until told by your health care provider. Stretching and range of motion exercises These exercises warm up your muscles and joints and improve the movement and flexibility of your elbow. These exercises also help to relieve pain and swelling. Exercise A: Passive elbow flexion  1. Lie on your back. 2. Extend your left / right arm up into the air, bracing it with your other hand. Allow your left / right arm to relax. 3. Let your left / right elbow bend, allowing your hand to fall slowly toward your chest. You should feel a gentle stretch along the back of your upper arm and elbow. 4. If told by your health care provider, hold a __________ hand weight to increase the intensity of this stretch. 5. Hold this position for __________ seconds. 6. Slowly return your left / right arm to the upright position. Repeat __________ times. Complete this exercise __________ times a day. Exercise B: Passive elbow extension  1. Lie on your back on a firm bed. Make sure that you are in a comfortable position that allows you relax your arm muscles. 2. Place a folded towel under your left / right upper arm so that your elbow and shoulder are at the same height. 3. Extend your left / right arm so your elbow and hand do not rest on the bed or towel. 4. Let the weight of your hand straighten your elbow. Keep your arm and chest muscles relaxed. You should feel a stretch on the inside of your elbow. 5. If told by your health care provider, increase the intensity of your stretch by adding a small wrist weight or hand weight. 6. Hold this position for __________ seconds. 7. Slowly return to the starting position. Repeat __________ times. Complete this exercise __________ times a day. Strengthening exercises These exercises build strength and endurance in your elbow. Endurance is the ability to use your muscles for a long time, even after they get tired. Exercise C: Elbow extensors, isometric  1. Stand or  sit upright on a firm surface. 2. Place your left / right arm so your palm faces your abdomen, at the height of your waist. 3. Place your other hand on the underside of your forearm. Slowly push your left / right arm down, like you were going to straighten your elbow. Resist with your other hand. Push as hard as you can without causing any pain or movement at your left / right elbow. 4. Hold this position for __________ seconds. 5. Slowly release the tension in both arms. 6. Let your muscles relax completely before repeating. Repeat __________ times. Complete this exercise __________ times a day. Exercise D: Elbow flexors, isometric 1. Stand or sit upright on a firm surface. 2. Place your left / right arm so your hand is palm-up, at the height of your waist. 3. Place your other hand on top of your forearm. Gently push up with your left / right arm, like you were going to bend your elbow. Resist this motion with your other hand.  Push as hard as you can without causing any pain or movement at your left / right elbow. 4. Hold this position for __________ seconds. 5. Slowly release the tension in both arms. 6. Let your muscles relax completely before repeating. Repeat __________ times. Complete this exercise __________ times a day. This information is not intended to replace advice given to you by your health care provider. Make sure you discuss any questions you have with your health care provider. Document Released: 01/09/2005 Document Revised: 09/14/2015 Document Reviewed: 10/08/2014 Elsevier Interactive Patient Education  [employer].

## 2017-04-20 NOTE — Progress Notes (Signed)
BP (!) 144/87   Pulse 65   Temp 98.3 F (36.8 C) (Oral)   Ht 5' 11.5" (1.816 m)   Wt 245 lb 4.8 oz (111.3 kg)   SpO2 98%   BMI 33.74 kg/m    Subjective:    Patient ID: Scott Hickman, male    DOB: Nov 24, 1975, 42 y.o.   MRN: [id number]  HPI: Scott Hickman is a 42 y.o. male  Chief Complaint  Patient presents with  . Cyst    pt states he has a cyst on his elbos that came up about 3 weeks ago, states he went to Urgent Care a week ago and had it drained but it has come back    Bursitis of right elbow Pt went to urgent care on 3/25.  Drained but recurred.  X-rays were done.  No pain.  Started after changing the water filter.  Polycystic kidney disease and takes no NSAIDs.  On kidney transplant list.  No fever or erythema.  Pt just wants it "gone."  Reviewed Urgent Care notes.  4 cc's drawn from elbow.  No steroid placed.  Compression bandage placed.    Relevant past medical, surgical, family and social history reviewed and updated as indicated. Interim medical history since our last visit reviewed. Allergies and medications reviewed and updated.  Review of Systems  Constitutional: Negative.   Respiratory: Negative.   Cardiovascular: Negative.   Gastrointestinal: Negative.   Psychiatric/Behavioral: Negative.     Per HPI unless specifically indicated above     Objective:    BP (!) 144/87   Pulse 65   Temp 98.3 F (36.8 C) (Oral)   Ht 5' 11.5" (1.816 m)   Wt 245 lb 4.8 oz (111.3 kg)   SpO2 98%   BMI 33.74 kg/m   Wt Readings from Last 3 Encounters:  04/20/17 245 lb 4.8 oz (111.3 kg)  04/16/17 240 lb (108.9 kg)  01/06/16 247 lb (112 kg)    Physical Exam  Constitutional: He is oriented to person, place, and time. He appears well-developed and well-nourished. No distress.  HENT:  Head: Normocephalic and atraumatic.  Eyes: Conjunctivae and lids are normal. Right eye exhibits no discharge. Left eye exhibits no discharge. No scleral icterus.  Neck: Normal range of motion.  Neck supple. No JVD present. Carotid bruit is not present.  Pulmonary/Chest: No respiratory distress.  Abdominal: Normal appearance. There is no splenomegaly or hepatomegaly.  Musculoskeletal: Normal range of motion.  Swelling back of right elbow.  No erythema  Neurological: He is alert and oriented to person, place, and time.  Skin: Skin is warm, dry and intact. No rash noted. No pallor.  Psychiatric: He has a normal mood and affect. His behavior is normal. Judgment and thought content normal.   Elbow bursitis Risks discussed.  Numbed with Lidocaine.  Using 18 gauge needle removed 9 cc's from right elbow.  Fluid serosanguinous.  Placed 1cc of K40 injected following drainage.    Results for orders placed or performed in visit on 02/08/16  Quantiferon tb gold assay (blood)  Result Value Ref Range   QUANTIFERON INCUBATION Comment   QuantiFERON In Tube  Result Value Ref Range   QUANTIFERON TB GOLD Negative Negative   QUANTIFERON CRITERIA Comment    QUANTIFERON TB AG VALUE 0.03 IU/mL   Quantiferon Nil Value 0.04 IU/mL   QUANTIFERON MITOGEN VALUE 7.73 IU/mL   QFT TB AG MINUS NIL VALUE <0.00 IU/mL   Interpretation: Comment       Assessment &  Plan:   Problem List Items Addressed This Visit    None    Visit Diagnoses    Olecranon bursitis of right elbow    -  Primary   Pt ed on care.  use compression bandage.  Send fluid for C&S.  Refer to Orthopedics in case of   Relevant Medications   triamcinolone acetonide (KENALOG-40) injection 40 mg (Completed)   Other Relevant Orders   Ambulatory referral to Orthopedic Surgery   Body Fluid Culture([facility])       Follow up plan: Return if symptoms worsen or fail to improve.

## 2017-04-23 LAB — BODY FLUID CULTURE

## 2017-05-01 ENCOUNTER — Institutional Professional Consult (permissible substitution): Admit: 2017-05-01 | Discharge: 2017-05-02 | Payer: BLUE CROSS/BLUE SHIELD

## 2017-05-01 DIAGNOSIS — Z7289 Other problems related to lifestyle: Secondary | ICD-10-CM

## 2017-05-01 DIAGNOSIS — N185 Chronic kidney disease, stage 5: Secondary | ICD-10-CM

## 2017-05-01 DIAGNOSIS — N186 End stage renal disease: Secondary | ICD-10-CM

## 2017-05-01 DIAGNOSIS — Z01810 Encounter for preprocedural cardiovascular examination: Secondary | ICD-10-CM

## 2017-05-01 DIAGNOSIS — Z01818 Encounter for other preprocedural examination: Principal | ICD-10-CM

## 2017-05-03 ENCOUNTER — Ambulatory Visit: Admit: 2017-05-03 | Discharge: 2017-05-04 | Payer: BLUE CROSS/BLUE SHIELD | Attending: Nephrology | Primary: Nephrology

## 2017-05-03 DIAGNOSIS — D631 Anemia in chronic kidney disease: Secondary | ICD-10-CM

## 2017-05-03 DIAGNOSIS — N185 Chronic kidney disease, stage 5: Principal | ICD-10-CM

## 2017-05-03 DIAGNOSIS — N2581 Secondary hyperparathyroidism of renal origin: Secondary | ICD-10-CM

## 2017-05-03 DIAGNOSIS — N189 Chronic kidney disease, unspecified: Secondary | ICD-10-CM

## 2017-05-03 DIAGNOSIS — E872 Acidosis: Secondary | ICD-10-CM

## 2017-05-07 ENCOUNTER — Ambulatory Visit: Admit: 2017-05-07 | Discharge: 2017-05-08 | Payer: BLUE CROSS/BLUE SHIELD

## 2017-05-07 ENCOUNTER — Encounter: Admit: 2017-05-07 | Discharge: 2017-05-08 | Payer: BLUE CROSS/BLUE SHIELD

## 2017-05-07 DIAGNOSIS — N189 Chronic kidney disease, unspecified: Secondary | ICD-10-CM

## 2017-05-07 DIAGNOSIS — Z01810 Encounter for preprocedural cardiovascular examination: Secondary | ICD-10-CM

## 2017-05-07 DIAGNOSIS — Z7289 Other problems related to lifestyle: Secondary | ICD-10-CM

## 2017-05-07 DIAGNOSIS — Z01818 Encounter for other preprocedural examination: Principal | ICD-10-CM

## 2017-05-07 DIAGNOSIS — N186 End stage renal disease: Secondary | ICD-10-CM

## 2017-05-07 DIAGNOSIS — D631 Anemia in chronic kidney disease: Secondary | ICD-10-CM

## 2017-05-07 DIAGNOSIS — Z7682 Awaiting organ transplant status: Secondary | ICD-10-CM

## 2017-05-07 DIAGNOSIS — E872 Acidosis: Secondary | ICD-10-CM

## 2017-05-07 DIAGNOSIS — N185 Chronic kidney disease, stage 5: Secondary | ICD-10-CM

## 2017-05-07 DIAGNOSIS — N2581 Secondary hyperparathyroidism of renal origin: Secondary | ICD-10-CM

## 2017-05-07 DIAGNOSIS — R319 Hematuria, unspecified: Secondary | ICD-10-CM

## 2017-05-07 DIAGNOSIS — Z125 Encounter for screening for malignant neoplasm of prostate: Secondary | ICD-10-CM

## 2017-06-12 ENCOUNTER — Encounter: Admit: 2017-06-12 | Discharge: 2017-06-13 | Payer: BLUE CROSS/BLUE SHIELD

## 2017-06-12 DIAGNOSIS — N185 Chronic kidney disease, stage 5: Principal | ICD-10-CM

## 2017-06-14 ENCOUNTER — Encounter: Admit: 2017-06-14 | Discharge: 2017-06-15 | Payer: BLUE CROSS/BLUE SHIELD | Attending: Nephrology | Primary: Nephrology

## 2017-06-14 DIAGNOSIS — N2581 Secondary hyperparathyroidism of renal origin: Secondary | ICD-10-CM

## 2017-06-14 DIAGNOSIS — I10 Essential (primary) hypertension: Secondary | ICD-10-CM

## 2017-06-14 DIAGNOSIS — F419 Anxiety disorder, unspecified: Secondary | ICD-10-CM

## 2017-06-14 DIAGNOSIS — L709 Acne, unspecified: Secondary | ICD-10-CM

## 2017-06-14 DIAGNOSIS — E872 Acidosis: Principal | ICD-10-CM

## 2017-06-14 DIAGNOSIS — N185 Chronic kidney disease, stage 5: Secondary | ICD-10-CM

## 2017-06-14 DIAGNOSIS — Z01818 Encounter for other preprocedural examination: Secondary | ICD-10-CM

## 2017-06-14 MED ORDER — ESCITALOPRAM 10 MG TABLET
ORAL_TABLET | Freq: Every day | ORAL | 11 refills | 0.00000 days | Status: CP
Start: 2017-06-14 — End: 2017-10-18

## 2017-06-14 MED ORDER — CARVEDILOL 6.25 MG TABLET
ORAL_TABLET | Freq: Two times a day (BID) | ORAL | 3 refills | 0.00000 days | Status: CP
Start: 2017-06-14 — End: 2018-07-16

## 2017-06-14 MED ORDER — DOXYCYCLINE HYCLATE 100 MG CAPSULE
ORAL_CAPSULE | Freq: Two times a day (BID) | ORAL | 0 refills | 0.00000 days | Status: SS
Start: 2017-06-14 — End: 2018-06-26

## 2017-06-15 ENCOUNTER — Encounter: Admit: 2017-06-15 | Discharge: 2017-06-15 | Payer: BLUE CROSS/BLUE SHIELD | Attending: Surgery | Primary: Surgery

## 2017-06-26 ENCOUNTER — Encounter: Admit: 2017-06-26 | Discharge: 2017-06-26 | Payer: BLUE CROSS/BLUE SHIELD | Attending: Nephrology | Primary: Nephrology

## 2017-06-26 DIAGNOSIS — Z005 Encounter for examination of potential donor of organ and tissue: Principal | ICD-10-CM

## 2017-08-07 ENCOUNTER — Encounter: Admit: 2017-08-07 | Discharge: 2017-08-08 | Payer: BLUE CROSS/BLUE SHIELD

## 2017-08-07 DIAGNOSIS — Z01810 Encounter for preprocedural cardiovascular examination: Secondary | ICD-10-CM

## 2017-08-07 DIAGNOSIS — N186 End stage renal disease: Secondary | ICD-10-CM

## 2017-08-07 DIAGNOSIS — Z7289 Other problems related to lifestyle: Secondary | ICD-10-CM

## 2017-08-07 DIAGNOSIS — Z01818 Encounter for other preprocedural examination: Principal | ICD-10-CM

## 2017-08-09 ENCOUNTER — Encounter: Admit: 2017-08-09 | Discharge: 2017-08-10 | Payer: BLUE CROSS/BLUE SHIELD

## 2017-08-09 DIAGNOSIS — I10 Essential (primary) hypertension: Secondary | ICD-10-CM

## 2017-08-09 DIAGNOSIS — E872 Acidosis: Secondary | ICD-10-CM

## 2017-08-09 DIAGNOSIS — N185 Chronic kidney disease, stage 5: Principal | ICD-10-CM

## 2017-08-09 DIAGNOSIS — N2581 Secondary hyperparathyroidism of renal origin: Secondary | ICD-10-CM

## 2017-08-30 ENCOUNTER — Encounter: Admit: 2017-08-30 | Discharge: 2017-08-31 | Payer: BLUE CROSS/BLUE SHIELD | Attending: Surgery | Primary: Surgery

## 2017-08-30 DIAGNOSIS — N186 End stage renal disease: Principal | ICD-10-CM

## 2017-09-18 ENCOUNTER — Encounter: Admit: 2017-09-18 | Discharge: 2017-09-18 | Payer: BLUE CROSS/BLUE SHIELD

## 2017-09-18 DIAGNOSIS — Q612 Polycystic kidney, adult type: Principal | ICD-10-CM

## 2017-09-20 ENCOUNTER — Ambulatory Visit: Admit: 2017-09-20 | Discharge: 2017-09-21 | Payer: BLUE CROSS/BLUE SHIELD

## 2017-09-20 DIAGNOSIS — N185 Chronic kidney disease, stage 5: Principal | ICD-10-CM

## 2017-09-20 DIAGNOSIS — E872 Acidosis: Secondary | ICD-10-CM

## 2017-09-20 DIAGNOSIS — Q612 Polycystic kidney, adult type: Secondary | ICD-10-CM

## 2017-09-20 DIAGNOSIS — I10 Essential (primary) hypertension: Secondary | ICD-10-CM

## 2017-09-20 DIAGNOSIS — N2581 Secondary hyperparathyroidism of renal origin: Secondary | ICD-10-CM

## 2017-10-02 ENCOUNTER — Institutional Professional Consult (permissible substitution): Admit: 2017-10-02 | Discharge: 2017-10-03 | Payer: BLUE CROSS/BLUE SHIELD

## 2017-10-02 DIAGNOSIS — N185 Chronic kidney disease, stage 5: Principal | ICD-10-CM

## 2017-10-16 ENCOUNTER — Encounter: Admit: 2017-10-16 | Discharge: 2017-10-17 | Payer: BLUE CROSS/BLUE SHIELD

## 2017-10-16 DIAGNOSIS — N185 Chronic kidney disease, stage 5: Principal | ICD-10-CM

## 2017-10-18 ENCOUNTER — Encounter: Admit: 2017-10-18 | Discharge: 2017-10-19 | Payer: BLUE CROSS/BLUE SHIELD

## 2017-10-18 DIAGNOSIS — E872 Acidosis: Secondary | ICD-10-CM

## 2017-10-18 DIAGNOSIS — F419 Anxiety disorder, unspecified: Secondary | ICD-10-CM

## 2017-10-18 DIAGNOSIS — N185 Chronic kidney disease, stage 5: Principal | ICD-10-CM

## 2017-10-18 DIAGNOSIS — I10 Essential (primary) hypertension: Secondary | ICD-10-CM

## 2017-10-18 DIAGNOSIS — N2581 Secondary hyperparathyroidism of renal origin: Secondary | ICD-10-CM

## 2017-10-18 MED ORDER — CALCITRIOL 0.25 MCG CAPSULE
ORAL_CAPSULE | Freq: Every day | ORAL | 3 refills | 0.00000 days | Status: CP
Start: 2017-10-18 — End: 2018-06-28

## 2017-10-18 MED ORDER — ESCITALOPRAM 10 MG TABLET
ORAL_TABLET | Freq: Every day | ORAL | 3 refills | 0.00000 days | Status: CP
Start: 2017-10-18 — End: 2018-08-28

## 2017-10-25 ENCOUNTER — Encounter: Admit: 2017-10-25 | Discharge: 2017-10-26 | Payer: BLUE CROSS/BLUE SHIELD

## 2017-10-25 DIAGNOSIS — N185 Chronic kidney disease, stage 5: Principal | ICD-10-CM

## 2017-11-14 ENCOUNTER — Encounter: Payer: Self-pay | Admitting: Nurse Practitioner

## 2017-11-14 DIAGNOSIS — N189 Chronic kidney disease, unspecified: Secondary | ICD-10-CM

## 2017-11-14 DIAGNOSIS — D631 Anemia in chronic kidney disease: Secondary | ICD-10-CM | POA: Insufficient documentation

## 2017-11-15 ENCOUNTER — Ambulatory Visit (INDEPENDENT_AMBULATORY_CARE_PROVIDER_SITE_OTHER): Payer: PRIVATE HEALTH INSURANCE | Admitting: Nurse Practitioner

## 2017-11-15 ENCOUNTER — Encounter: Payer: Self-pay | Admitting: Nurse Practitioner

## 2017-11-15 DIAGNOSIS — Q612 Polycystic kidney, adult type: Secondary | ICD-10-CM

## 2017-11-15 DIAGNOSIS — Z0000 Encounter for general adult medical examination without abnormal findings: Secondary | ICD-10-CM

## 2017-11-15 DIAGNOSIS — N185 Chronic kidney disease, stage 5: Secondary | ICD-10-CM | POA: Diagnosis not present

## 2017-11-15 DIAGNOSIS — I1311 Hypertensive heart and chronic kidney disease without heart failure, with stage 5 chronic kidney disease, or end stage renal disease: Secondary | ICD-10-CM

## 2017-11-15 NOTE — Assessment & Plan Note (Signed)
Chronic, ongoing.  Managed by [facility] providers.  Reports good management on current medications.  Amlodipine and Carvedilol presently.

## 2017-11-15 NOTE — Progress Notes (Signed)
BP (!) 138/98   Pulse 69   Ht 5' 11.65" (1.82 m)   Wt 248 lb (112.5 kg)   SpO2 98%   BMI 33.96 kg/m    Subjective:    Patient ID: Scott Hickman, male    DOB: [birth year], 42 y.o.   MRN: [id number]  HPI: Scott Hickman is a 42 y.o. male presents for annual physical.  No chief complaint on file.  ADPKD: He is followed by Dr. Rockwell Germany at [facility] for autosomal dominant polycystic kidney disease and is currently on transplant list, has been on list x 2 years.  All labs are obtained when at clinic visits. On review of records has one niece and a sister diagnosed with same disease process.  His sister was diagnosed during assessment to see if she could be donor to patient and subsequently her daughter was diagnosed.  Has AV fistula assessment 11/29/17 and discussion on dialysis, from this appointment they would schedule fistula placement and goal is to start in home dialysis. He has labs done monthly at [facility].  CKD V: As noted followed by [facility], Dr. Rockwell Germany and is currently working towards in home dialysis with assessment for AV fistula placement on 11/29/17.  He reports he continues to urinate well.  Does endorse some fatigue, which he has been told is to be expected.  Currently on sodium bicarbonate and calcitriol.    HTN: Chronic, ongoing secondary to ADPKD and CKD V.  Continues to be followed by [facility] and is currently on Amlodipine and Carvedilol.  Relevant past medical, surgical, family and social history reviewed and updated as indicated. Interim medical history since our last visit reviewed. Allergies and medications reviewed and updated.  Review of Systems  Constitutional: Negative for activity change, diaphoresis, fatigue and fever.  HENT: Negative for congestion, ear pain, rhinorrhea, sinus pressure, sinus pain and sore throat.   Eyes: Negative for pain, discharge and redness.  Respiratory: Negative for cough, chest tightness, shortness of breath and wheezing.   Cardiovascular: Negative for chest  pain, palpitations and leg swelling.  Gastrointestinal: Negative for abdominal distention and abdominal pain.  Endocrine: Negative for cold intolerance, heat intolerance, polydipsia, polyphagia and polyuria.  Genitourinary: Negative for decreased urine volume, difficulty urinating and dysuria.  Musculoskeletal: Negative for back pain, myalgias and neck pain.  Skin: Negative.   Allergic/Immunologic: Negative.   Neurological: Negative for dizziness, syncope, weakness, numbness and headaches.  Hematological: Negative.   Psychiatric/Behavioral: Negative for behavioral problems.    Per HPI unless specifically indicated above     Objective:    BP (!) 138/98   Pulse 69   Ht 5' 11.65" (1.82 m)   Wt 248 lb (112.5 kg)   SpO2 98%   BMI 33.96 kg/m   Wt Readings from Last 3 Encounters:  11/15/17 248 lb (112.5 kg)  04/20/17 245 lb 4.8 oz (111.3 kg)  04/16/17 240 lb (108.9 kg)    Physical Exam  Constitutional: He is oriented to person, place, and time. He appears well-developed and well-nourished.  HENT:  Head: Normocephalic and atraumatic.  Right Ear: External ear normal.  Left Ear: External ear normal.  Nose: Nose normal.  Mouth/Throat: Oropharynx is clear and moist.  Eyes: Pupils are equal, round, and reactive to light. Conjunctivae and EOM are normal.  Neck: Trachea normal and normal range of motion. Neck supple. No JVD present. Carotid bruit is not present.  Cardiovascular: Normal rate, regular rhythm, S1 normal, S2 normal, normal heart sounds and intact distal pulses.  Pulmonary/Chest:  Effort normal and breath sounds normal.  Abdominal: Soft. Bowel sounds are normal.  Genitourinary:  Genitourinary Comments: Deferred per patient request  Musculoskeletal: Normal range of motion.  Lymphadenopathy:    He has no cervical adenopathy.  Neurological: He is alert and oriented to person, place, and time. He has normal reflexes.  Reflex Scores:      Brachioradialis reflexes are 2+ on the  right side and 2+ on the left side.      Patellar reflexes are 2+ on the right side and 2+ on the left side. Skin: Skin is warm and dry.  Psychiatric: He has a normal mood and affect. His behavior is normal. Judgment and thought content normal.  Nursing note and vitals reviewed.     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  Managed by [facility] providers.  Reports good management on current medications.  Amlodipine and Carvedilol presently.        Genitourinary   Autosomal dominant polycystic kidney disease    Chronic, ongoing. Followed by [facility], Dr. Rockwell Germany.  On transplant list x 2 years. Current plan is to initiate dialysis.  He has labs drawn monthly at [facility].      Chronic kidney disease, stage V (HCC)    Chronic, ongoing.  Followed by [facility] with plan for dialysis.          Follow up plan: Return in about 1 year (around 11/16/2018), or if symptoms worsen or fail to improve, for annual physical.

## 2017-11-15 NOTE — Assessment & Plan Note (Signed)
Chronic, ongoing. Followed by [facility], Dr. Rockwell Germany.  On transplant list x 2 years. Current plan is to initiate dialysis.  He has labs drawn monthly at [facility].

## 2017-11-15 NOTE — Assessment & Plan Note (Signed)
Chronic, ongoing.  Followed by [facility] with plan for dialysis.

## 2017-11-26 ENCOUNTER — Encounter: Admit: 2017-11-26 | Discharge: 2017-11-27 | Payer: BLUE CROSS/BLUE SHIELD

## 2017-11-26 DIAGNOSIS — Z01818 Encounter for other preprocedural examination: Secondary | ICD-10-CM

## 2017-11-26 DIAGNOSIS — N186 End stage renal disease: Secondary | ICD-10-CM

## 2017-11-26 DIAGNOSIS — Z7289 Other problems related to lifestyle: Secondary | ICD-10-CM

## 2017-11-26 DIAGNOSIS — Z01810 Encounter for preprocedural cardiovascular examination: Secondary | ICD-10-CM

## 2017-11-28 ENCOUNTER — Encounter: Admit: 2017-11-28 | Discharge: 2017-11-28 | Payer: BLUE CROSS/BLUE SHIELD | Attending: Nephrology | Primary: Nephrology

## 2017-11-28 DIAGNOSIS — Z01818 Encounter for other preprocedural examination: Principal | ICD-10-CM

## 2017-12-04 ENCOUNTER — Encounter: Admit: 2017-12-04 | Discharge: 2017-12-05 | Payer: BLUE CROSS/BLUE SHIELD

## 2017-12-04 DIAGNOSIS — I120 Hypertensive chronic kidney disease with stage 5 chronic kidney disease or end stage renal disease: Principal | ICD-10-CM

## 2017-12-04 DIAGNOSIS — N185 Chronic kidney disease, stage 5: Principal | ICD-10-CM

## 2017-12-05 ENCOUNTER — Encounter: Admit: 2017-12-05 | Discharge: 2017-12-05 | Payer: BLUE CROSS/BLUE SHIELD

## 2017-12-05 ENCOUNTER — Ambulatory Visit: Admit: 2017-12-05 | Discharge: 2017-12-05 | Payer: BLUE CROSS/BLUE SHIELD

## 2017-12-05 DIAGNOSIS — I120 Hypertensive chronic kidney disease with stage 5 chronic kidney disease or end stage renal disease: Principal | ICD-10-CM

## 2017-12-05 MED ORDER — OXYCODONE 5 MG TABLET
ORAL_TABLET | ORAL | 0 refills | 0.00000 days | Status: CP | PRN
Start: 2017-12-05 — End: 2018-06-28

## 2017-12-06 ENCOUNTER — Encounter: Admit: 2017-12-06 | Discharge: 2017-12-07 | Payer: BLUE CROSS/BLUE SHIELD

## 2017-12-06 DIAGNOSIS — N185 Chronic kidney disease, stage 5: Principal | ICD-10-CM

## 2017-12-06 DIAGNOSIS — E872 Acidosis: Secondary | ICD-10-CM

## 2017-12-06 DIAGNOSIS — Q612 Polycystic kidney, adult type: Secondary | ICD-10-CM

## 2017-12-06 DIAGNOSIS — N2581 Secondary hyperparathyroidism of renal origin: Secondary | ICD-10-CM

## 2017-12-06 DIAGNOSIS — I10 Essential (primary) hypertension: Secondary | ICD-10-CM

## 2018-01-03 ENCOUNTER — Ambulatory Visit: Admit: 2018-01-03 | Discharge: 2018-01-04 | Payer: BLUE CROSS/BLUE SHIELD | Attending: Surgery | Primary: Surgery

## 2018-01-03 DIAGNOSIS — I770 Arteriovenous fistula, acquired: Secondary | ICD-10-CM

## 2018-01-03 DIAGNOSIS — N186 End stage renal disease: Principal | ICD-10-CM

## 2018-01-08 ENCOUNTER — Encounter: Admit: 2018-01-08 | Discharge: 2018-01-09 | Payer: BLUE CROSS/BLUE SHIELD

## 2018-01-08 DIAGNOSIS — N185 Chronic kidney disease, stage 5: Principal | ICD-10-CM

## 2018-01-10 ENCOUNTER — Encounter: Admit: 2018-01-10 | Discharge: 2018-01-11 | Payer: BLUE CROSS/BLUE SHIELD

## 2018-01-10 DIAGNOSIS — D631 Anemia in chronic kidney disease: Secondary | ICD-10-CM

## 2018-01-10 DIAGNOSIS — Q612 Polycystic kidney, adult type: Secondary | ICD-10-CM

## 2018-01-10 DIAGNOSIS — I10 Essential (primary) hypertension: Secondary | ICD-10-CM

## 2018-01-10 DIAGNOSIS — N185 Chronic kidney disease, stage 5: Principal | ICD-10-CM

## 2018-01-10 DIAGNOSIS — E872 Acidosis: Secondary | ICD-10-CM

## 2018-01-10 DIAGNOSIS — N189 Chronic kidney disease, unspecified: Secondary | ICD-10-CM

## 2018-01-14 ENCOUNTER — Telehealth: Payer: Self-pay | Admitting: Family Medicine

## 2018-01-14 NOTE — Telephone Encounter (Signed)
Copied from [place] [id number]. Topic: Quick Communication - See Telephone Encounter >> Jan 14, 2018 11:01 AM Ivar Drape wrote: CRM for notification. See Telephone encounter for: 01/14/18. Patient had a physical on 11/15/17 with Jolene but it was not coded as a physical.  It was coded as a regular office visit.  This has resulted in him getting a co-pay bill and also his employer is now charging him a high medical premium because it looks like he did not receive a physical this year.  This needs to be re-coded. Please have this straightened out ASAP.  He can be reached [phone].

## 2018-01-21 NOTE — Addendum Note (Signed)
Addended by: Marnee Guarneri T on: 01/21/2018 10:26 AM   Modules accepted: Level of Service

## 2018-01-21 NOTE — Telephone Encounter (Signed)
Henrine Screws,  Mr. Karger (patient) is requesting that the coding for DOS 11/15/17 be changed from an OV to a CPE.  Patient states that he came in for a CPE (as required by his employer) and will have to pay a higher insurance premium if the coding is not changed.  Please review and advise.  Thanks, Alwyn Ren

## 2018-01-21 NOTE — Telephone Encounter (Signed)
My first weeks here.  I fixed this charge in the chart.

## 2018-01-23 HISTORY — PX: AV FISTULA PLACEMENT: SHX1204

## 2018-02-04 ENCOUNTER — Ambulatory Visit: Payer: PRIVATE HEALTH INSURANCE | Admitting: Family Medicine

## 2018-02-04 ENCOUNTER — Encounter: Payer: Self-pay | Admitting: Family Medicine

## 2018-02-04 VITALS — BP 135/88 | HR 71 | Temp 98.6°F | Wt 272.0 lb

## 2018-02-04 DIAGNOSIS — M7021 Olecranon bursitis, right elbow: Secondary | ICD-10-CM

## 2018-02-04 MED ORDER — SODIUM BICARBONATE 650 MG TABLET
ORAL_TABLET | Freq: Two times a day (BID) | ORAL | 6 refills | 0.00000 days | Status: CP
Start: 2018-02-04 — End: 2018-03-21

## 2018-02-04 NOTE — Progress Notes (Signed)
BP 135/88   Pulse 71   Temp 98.6 F (37 C) (Oral)   Wt 272 lb (123.4 kg)   SpO2 98%   BMI 37.25 kg/m    Subjective:    Patient ID: Scott Hickman, male    DOB: 1975-05-30, 43 y.o.   MRN: [id number]  HPI: Scott Hickman is a 43 y.o. male  Chief Complaint  Patient presents with  . Cyst    pt states he has a knot on his elbow that has come back    LUMP Duration: 4-5 days Location: R elbow Onset: sudden Painful: yes Discomfort: yes Status:  bigger Trauma: no Redness: no Bruising: no Recent infection: no Swollen lymph nodes: no Requesting removal: yes History of cancer: no Family history of cancer: no History of the same: yes- had it drained about 9 months ago  Relevant past medical, surgical, family and social history reviewed and updated as indicated. Interim medical history since our last visit reviewed. Allergies and medications reviewed and updated.  Review of Systems  Constitutional: Negative.   Respiratory: Negative.   Cardiovascular: Negative.   Musculoskeletal: Positive for arthralgias and joint swelling. Negative for back pain, gait problem, myalgias, neck pain and neck stiffness.  Skin: Negative.   Neurological: Negative.   Psychiatric/Behavioral: Negative.     Per HPI unless specifically indicated above     Objective:    BP 135/88   Pulse 71   Temp 98.6 F (37 C) (Oral)   Wt 272 lb (123.4 kg)   SpO2 98%   BMI 37.25 kg/m   Wt Readings from Last 3 Encounters:  02/04/18 272 lb (123.4 kg)  11/15/17 248 lb (112.5 kg)  04/20/17 245 lb 4.8 oz (111.3 kg)    Physical Exam Vitals signs and nursing note reviewed.  Constitutional:      General: He is not in acute distress.    Appearance: Normal appearance. He is not ill-appearing, toxic-appearing or diaphoretic.  HENT:     Head: Normocephalic and atraumatic.     Right Ear: External ear normal.     Left Ear: External ear normal.     Nose: Nose normal.     Mouth/Throat:     Mouth: Mucous  membranes are moist.     Pharynx: Oropharynx is clear.  Eyes:     General: No scleral icterus.       Right eye: No discharge.        Left eye: No discharge.     Extraocular Movements: Extraocular movements intact.     Conjunctiva/sclera: Conjunctivae normal.     Pupils: Pupils are equal, round, and reactive to light.  Neck:     Musculoskeletal: Normal range of motion and neck supple.  Cardiovascular:     Rate and Rhythm: Normal rate and regular rhythm.     Pulses: Normal pulses.     Heart sounds: Normal heart sounds. No murmur. No friction rub. No gallop.   Pulmonary:     Effort: Pulmonary effort is normal. No respiratory distress.     Breath sounds: Normal breath sounds. No stridor. No wheezing, rhonchi or rales.  Chest:     Chest wall: No tenderness.  Musculoskeletal: Normal range of motion.        General: Swelling (swelling, non-tender, non-red olecronon bursa on R elbow) present.  Skin:    General: Skin is warm and dry.     Capillary Refill: Capillary refill takes less than 2 seconds.     Coloration: Skin is not  jaundiced or pale.     Findings: No bruising, erythema, lesion or rash.  Neurological:     General: No focal deficit present.     Mental Status: He is alert and oriented to person, place, and time. Mental status is at baseline.  Psychiatric:        Mood and Affect: Mood normal.        Behavior: Behavior normal.        Thought Content: Thought content normal.        Judgment: Judgment normal.     Results for orders placed or performed in visit on 04/20/17  Body Fluid Culture([facility])  Result Value Ref Range   Body Fluid Culture, Sterile Final report    Organism ID, Bacteria Comment       Assessment & Plan:   Problem List Items Addressed This Visit    None    Visit Diagnoses    Olecranon bursitis of right elbow    -  Primary   Attempted to drain bursa today- blood came out, but would not drain. Will get him into see ortho ASAP. Call with any  concerns.    Relevant Orders   Ambulatory referral to Orthopedic Surgery      Procedure: Right Olecranon bursitis drainage and steroid injection        Diagnosis:   ICD-10-CM   1. Olecranon bursitis of right elbow M70.21 Ambulatory referral to Orthopedic Surgery   Attempted to drain bursa today- blood came out, but would not drain. Will get him into see ortho ASAP. Call with any concerns.     Physician: MJ Consent:  Risks, benefits, and alternative treatments discussed and all questions were answered.  Patient elected to proceed and verbal consent obtained.  Description: Area prepped and draped using  semi-sterile technique.  18 gauge needle inserted into olecranon bursa after use of freezing spray, small amount of blood drawn back into the syringe with some tissue like material. Needle moved around in elbow with nothing coming out.  Complications: Unable to drain Post Procedure Instructions: Wound care instructions discussed and patient was instructed to keep area clean and dry.  Signs and symptoms of infection discussed, patient agrees to contact the office ASAP should they occur.   Follow up plan: Return if symptoms worsen or fail to improve.

## 2018-02-07 DIAGNOSIS — N186 End stage renal disease: Principal | ICD-10-CM

## 2018-02-08 ENCOUNTER — Encounter
Admit: 2018-02-08 | Discharge: 2018-02-08 | Payer: BLUE CROSS/BLUE SHIELD | Attending: Anesthesiology | Primary: Anesthesiology

## 2018-02-08 ENCOUNTER — Encounter: Admit: 2018-02-08 | Discharge: 2018-02-08 | Payer: BLUE CROSS/BLUE SHIELD

## 2018-02-08 DIAGNOSIS — N186 End stage renal disease: Principal | ICD-10-CM

## 2018-02-08 MED ORDER — OXYCODONE 5 MG TABLET
ORAL_TABLET | ORAL | 0 refills | 0.00000 days | Status: SS | PRN
Start: 2018-02-08 — End: 2018-06-26

## 2018-02-12 MED ORDER — AMLODIPINE 10 MG TABLET
ORAL_TABLET | 4 refills | 0 days | Status: SS
Start: 2018-02-12 — End: 2018-06-28

## 2018-03-07 ENCOUNTER — Ambulatory Visit: Admit: 2018-03-07 | Discharge: 2018-03-08 | Payer: BLUE CROSS/BLUE SHIELD

## 2018-03-07 DIAGNOSIS — I770 Arteriovenous fistula, acquired: Principal | ICD-10-CM

## 2018-03-07 DIAGNOSIS — N186 End stage renal disease: Secondary | ICD-10-CM

## 2018-03-07 DIAGNOSIS — Z4802 Encounter for removal of sutures: Secondary | ICD-10-CM

## 2018-03-07 DIAGNOSIS — Z9889 Other specified postprocedural states: Secondary | ICD-10-CM

## 2018-03-07 DIAGNOSIS — Q612 Polycystic kidney, adult type: Secondary | ICD-10-CM

## 2018-03-20 ENCOUNTER — Encounter: Admit: 2018-03-20 | Discharge: 2018-03-21 | Payer: BLUE CROSS/BLUE SHIELD

## 2018-03-20 DIAGNOSIS — N185 Chronic kidney disease, stage 5: Principal | ICD-10-CM

## 2018-03-20 DIAGNOSIS — N2581 Secondary hyperparathyroidism of renal origin: Secondary | ICD-10-CM

## 2018-03-21 ENCOUNTER — Ambulatory Visit: Admit: 2018-03-21 | Discharge: 2018-03-22 | Payer: BLUE CROSS/BLUE SHIELD

## 2018-03-21 DIAGNOSIS — N185 Chronic kidney disease, stage 5: Principal | ICD-10-CM

## 2018-03-21 DIAGNOSIS — N2581 Secondary hyperparathyroidism of renal origin: Secondary | ICD-10-CM

## 2018-03-21 MED ORDER — TRAZODONE 50 MG TABLET
ORAL_TABLET | Freq: Every evening | ORAL | 0 refills | 0 days | Status: CP
Start: 2018-03-21 — End: 2018-04-11

## 2018-03-26 ENCOUNTER — Other Ambulatory Visit: Payer: Self-pay | Admitting: Specialist

## 2018-03-28 ENCOUNTER — Other Ambulatory Visit: Payer: Self-pay

## 2018-03-28 ENCOUNTER — Encounter
Admission: RE | Admit: 2018-03-28 | Discharge: 2018-03-28 | Disposition: A | Discharge status: Home / Self Care | Payer: PRIVATE HEALTH INSURANCE | Source: Ambulatory Visit | Attending: Specialist | Admitting: Specialist

## 2018-03-28 HISTORY — DX: Gastro-esophageal reflux disease without esophagitis: K21.9

## 2018-03-28 NOTE — Patient Instructions (Signed)
Your procedure is scheduled on: 04-01-18 MONDAY Report to Same Day Surgery 2nd floor medical mall [facility] Entrance-take elevator on left to 2nd floor.  Check in with surgery information desk.) To find out your arrival time please call [phone] between 1PM - 3PM on 03-29-18 FRIDAY  Remember: Instructions that are not followed completely may result in serious medical risk, up to and including death, or upon the discretion of your surgeon and anesthesiologist your surgery may need to be rescheduled.    _x___ 1. Do not eat food after midnight the night before your procedure. NO GUM OR CANDY AFTER MIDNIGHT. You may drink clear liquids up to 2 hours before you are scheduled to arrive at the hospital for your procedure.  Do not drink clear liquids within 2 hours of your scheduled arrival to the hospital.  Clear liquids include  --Water or Apple juice without pulp  --Clear carbohydrate beverage such as ClearFast or Gatorade  --Black Coffee or Clear Tea (No milk, no creamers, do not add anything to the coffee or Tea Type 1 and type 2 diabetics should only drink water.   ____Ensure clear carbohydrate drink on the way to the hospital for bariatric patients  ____Ensure clear carbohydrate drink 3 hours before surgery for Dr Dwyane Luo patients if physician instructed.    __x__ 2. No Alcohol for 24 hours before or after surgery.   __x__3. No Smoking or e-cigarettes for 24 prior to surgery.  Do not use any chewable tobacco products for at least 6 hour prior to surgery   ____  4. Bring all medications with you on the day of surgery if instructed.    __x__ 5. Notify your doctor if there is any change in your medical condition     (cold, fever, infections).    x___6. On the morning of surgery brush your teeth with toothpaste and water.  You may rinse your mouth with mouth wash if you wish.  Do not swallow any toothpaste or mouthwash.   Do not wear jewelry, make-up, hairpins, clips or nail  polish.  Do not wear lotions, powders, or perfumes. You may wear deodorant.  Do not shave 48 hours prior to surgery. Men may shave face and neck.  Do not bring valuables to the hospital.    [facility] is not responsible for any belongings or valuables.               Contacts, dentures or bridgework may not be worn into surgery.  Leave your suitcase in the car. After surgery it may be brought to your room.  For patients admitted to the hospital, discharge time is determined by your treatment team.  _  Patients discharged the day of surgery will not be allowed to drive home.  You will need someone to drive you home and stay with you the night of your procedure.    Please read over the following fact sheets that you were given:   [facility] Preparing for Surgery   _x___ TAKE THE FOLLOWING MEDICATION THE MORNING OF SURGERY WITH A SMALL SIP OF WATER. These include:  1. AMLODIPINE (NORVASC)  2. COREG (CARVEDILOL)  3. LEXAPRO (ESCITALPRAM)  4.  5.  6.  ____Fleets enema or Magnesium Citrate as directed.   _x___ Use CHG Soap or sage wipes as directed on instruction sheet   ____ Use inhalers on the day of surgery and bring to hospital day of surgery  ____ Stop Metformin and Janumet 2 days prior to surgery.  ____ Take 1/2 of usual insulin dose the night before surgery and none on the morning  surgery.   ____ Follow recommendations from Cardiologist, Pulmonologist or PCP regarding stopping Aspirin, Coumadin, Plavix ,Eliquis, Effient, or Pradaxa, and Pletal.  X____Stop Anti-inflammatories such as Advil, Aleve, Ibuprofen, Motrin, Naproxen, Naprosyn, Goodies powders or aspirin products  NOW-OK to take Tylenol   ____ Stop supplements until after surgery.   ____ Bring C-Pap to the hospital.

## 2018-03-28 NOTE — Pre-Procedure Instructions (Signed)
Progress Notes - documented in this encounter Scott Bien, MD - 03/21/2018 2:30 PM EST Formatting of this note might be different from the original. 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 is a 43 y.o. 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   He is doing well today. He underwent transposition of the basilic vein AVF on 1/61/0960. He reports a small area of soreness near his elbow as well as an area of numbness on his forearm. No issues with numbness or tingling in his hand.   He has not experienced and decreased appetite, abdominal pain or nausea/vomiting. He does report insomnia. He has not tried any medications lately however did try Ambien in the past.   No chest pain, shortness of breath, lower extremity edema or fatigue. No changes with urine output. He has gained 4 lbs since his last week. No obvious signs of hypervolemia.    ROS: 10 system ROS negative except per HPI listed above  PAST MEDICAL HISTORY: Past Medical History:  Diagnosis Date  . ADPKD (autosomal dominant polycystic kidney disease)  . Hypertension  . Kidney stone  . Polycystic kidney disease   SOCIAL HISTORY: From [place], [place]. Wife from [employer]. Works at [employer] in [place], wife works for [employer]. 2 kids. No tobacco, EtOH, drugs.   FAMILY HISTORY: Dad has ADPKD, 2 sisters - thought neither had but sister dx as pre donor work up at [employer]. Also has niece that's been dx too.   ALLERGIES Benazepril; Clarithromycin; and Penicillins  MEDICATIONS: Current Outpatient Medications  Medication Sig Dispense Refill  . amLODIPine (NORVASC) 10 MG tablet TAKE 1 TABLET BY MOUTH EVERY DAY 90 tablet 4  . calcitriol (ROCALTROL) 0.25 MCG capsule Take 1 capsule (0.25 mcg total) by mouth daily. (Patient taking differently: Take 0.25 mcg by mouth every morning. ) 90 capsule 3  . carvedilol (COREG) 6.25 MG tablet  Take 1 tablet (6.25 mg total) by mouth Two (2) times a day. 180 tablet 3  . doxycycline (VIBRAMYCIN) 100 MG capsule Take 1 capsule (100 mg total) by mouth Two (2) times a day. 30 capsule 0  . escitalopram oxalate (LEXAPRO) 10 MG tablet Take 1 tablet (10 mg total) by mouth daily. (Patient taking differently: Take 10 mg by mouth every morning. ) 90 tablet 3  . oxyCODONE (ROXICODONE) 5 MG immediate release tablet Take 1 tablet (5 mg total) by mouth every four (4) hours as needed for pain. for up to 7 doses 7 tablet 0  . oxyCODONE (ROXICODONE) 5 MG immediate release tablet Take 1 tablet (5 mg total) by mouth every four (4) hours as needed for pain. 10 tablet 0  . sodium bicarbonate 650 mg tablet Take 3 tablets (1,950 mg total) by mouth Two (2) times a day. 180 tablet 6  . furosemide (LASIX) 40 MG tablet Take 1 tablet (40 mg total) by mouth daily as needed. 60 tablet 11  . traZODone (DESYREL) 50 MG tablet Take 1 tablet (50 mg total) by mouth nightly. 30 tablet 0   No current facility-administered medications for this visit.   PHYSICAL EXAM: Vitals:  03/21/18 1411  BP: 130/91  Pulse: 76  General: alert, in no acute distress  ENT: Moist mucous membranes, oropharynx clear without erythema or exudate EYES: Extra ocular movements intact. Pupils reactive, sclerae anicteric. CARDIOVASCULAR: Regular, normal S1/S2 heart sounds.  PULM: Clear to  auscultation bilaterally GASTROINTESTINAL: Soft, active bowel sounds, nontender MUSCULOSKELETAL: No lower extremity edema bilaterally.  NEUROLOGIC: No focal motor or sensory deficits ACCESS: left brachial-basilic AVF; +bruit/thrill;   MEDICAL DECISION MAKING  Lab Results  Component Value Date  NA 141 03/20/2018  K 3.9 03/20/2018  CL 105 03/20/2018  CO2 19.0 (L) 03/20/2018  BUN 52 (H) 03/20/2018  CREATININE 8.89 (H) 03/20/2018  GLU 157 03/20/2018  CALCIUM 8.6 03/20/2018  ALBUMIN 4.3 12/04/2017  PHOS 5.5 (H) 12/04/2017   eGFR by CKD-EPI : 7  ml/min  ASSESSMENT/PLAN: [name] is a 43 y.o. year old patient with a past medical history significant for AKPKD and HTN who is being seen for follow up visit.   **CKD 5: Secondary to ADPKD. eGFR stable at 7 ml/min No uremic symptoms. Initially, was planning to do PD however due to reasons above, patient has decided to now pursue home hemodialysis.  --has left BB AVF (placed 12/05/2017 by Dr. Alice Reichert, transposed 02/08/2018); good bruit and thrill. Will be ready for cannulation on 03/24/2018 per Dr. Ricky Stabs last note. Access feels great today.  --no indications for dialysis. Will monitor symptoms  --has experienced some weight gain; unsure if this is due to volume or lean weight gain. Patient to monitor weights at home. If continue to gain weight, will start Lasix to see if this helps.   **BMM: Ca/Phos acceptable. Continue calcitriol 0.25 daily.   **Metabolic acidosis: Bicarbonate levels are stable. He's currently taking 9 pills per day.   **HTN: Continue amlodipine 10 daily and coreg 6.25 BID   **Anemia of CKD: Very mild. ESA when indicated. Continue ferrous sulfate 325 BID. Will recheck CBC next visit.   **Access: left BB AVF  -- transposition scheduled next month   **Insomnia: will try Trazodone 21m at night.   Mr.Rendell BLatourwill follow up in 6-8 weeks or sooner if needed.     Electronically signed by KFanny Bien MD at 03/21/2018 4:42 PM EST   Plan of Treatment - documented as of this encounter Upcoming Encounters Upcoming Encounters  Date Type Specialty Care Team Description  05/09/2018 Office Visit Nephrology [place] CB# [id number]  [place] [address] [phone](Fax)     Lab Results - documented in this encounter  PTH (03/20/2018 12:38 PM EST) PTH (03/20/2018 12:38 PM EST)  Component Value Ref Range Performed At Pathologist Signature  PTH 744.0 (H) 12.0 - 72.0 pg/mL  [facility]   Calcium 8.7 8.5 - 10.2 mg/dL [facility]    PTH (03/20/2018 12:38 PM EST)  Specimen  Blood   PTH (03/20/2018 12:38 PM EST)  Performing Organization Address City/State/Zipcode Phone Number  [place] [address] [place] [id number] [phone]   Visit Diagnoses - documented in this encounter Diagnosis  Chronic kidney disease, stage V (CMS-HCC)  Chronic kidney disease, Stage V   Secondary hyperparathyroidism (CMS-HCC)  Secondary hyperparathyroidism (of renal origin)   Images  Patient Contacts  Contact Name Contact Address Communication Relationship to Patient  [address]le [address] [place] [phone]([place]) Spouse, Emergency [place]) Relative, Emergency Contact  Document Information  Primary Care Provider Other Service Providers Document Coverage Dates  MRexford Maus MD (Oct. 31, 2014October 31, 2014 - Present) [phone](Work) [phone](Fax) [place] [phone] [place], [place] [address] Richarda Blade, MD (Referring Physician) [phone] (Work) [phone] (Fax) [place], [place] [address] Nephrology [facility] [place], [place] [address] Sandrea Hughs, MD [phone] (Work) [phone] (Fax) [address] [place], [place] [address] General Surgery [facility] [address] [place], [place] [address] Vassie Loll, MSW (Case Manager/Social Worker) Kidney Pre Transplant Program [facility]  Social Work Angelina Ok, [place] (Transplant Coordinator)  Transplant [facility] [address] [place], [place] [address] Adelene Amas, RN [facility])  Vascular Surgery [facility] [address] [place], [place] [address] Benna Dunks, [place] (Nurse Practitioner) [phone] (Work) [phone] (Fax) [address] [facility], [place], [place] [address] [place] [address] [place], [place] [address] Feb. 27, 2020February 27, 2020   [place] [address] [place], [place] [address]   Encounter Providers Encounter Date  Scott Bien, MD (Attending) [phone] (Work) [phone] (Fax) [phone] [place], [place] [address] Nephrology Feb. 27, 2020February 27, 2020

## 2018-03-28 NOTE — Pre-Procedure Instructions (Signed)
ECG 12 Lead4/16/2019 [place] Component Name Value Ref Range  EKG Systolic BP  mmHg  EKG Diastolic BP  mmHg  EKG Ventricular Rate 57 BPM  EKG Atrial Rate 57 BPM  EKG P-R Interval 190 ms  EKG QRS Duration 96 ms  EKG Q-T Interval 478 ms  EKG QTC Calculation 465 ms  EKG Calculated P Axis 65 degrees  EKG Calculated R Axis 13 degrees  EKG Calculated T Axis 35 degrees  QTC Fredericia 470 ms  Result Narrative  SINUS BRADYCARDIA INFERIOR INFARCT (CITED ON OR BEFORE 26-Jan-2016) ABNORMAL ECG WHEN COMPARED WITH ECG OF 26-Jan-2016 12:34, NO SIGNIFICANT CHANGE WAS FOUND Confirmed by Valorie Roosevelt ([id number]) on 05/08/2017 6:40:02 AM  Other Result Information  Interface, Rad Results In - 05/08/2017  6:40 AM EDT SINUS BRADYCARDIA INFERIOR INFARCT (CITED ON OR BEFORE 26-Jan-2016) ABNORMAL ECG WHEN COMPARED WITH ECG OF 26-Jan-2016 12:34, NO SIGNIFICANT CHANGE WAS FOUND Confirmed by Valorie Roosevelt ([id number]) on 05/08/2017 6:40:02 AM  Status Results Details   Unavailable

## 2018-03-29 ENCOUNTER — Encounter
Admission: RE | Admit: 2018-03-29 | Discharge: 2018-03-29 | Disposition: A | Discharge status: Home / Self Care | Payer: PRIVATE HEALTH INSURANCE | Source: Ambulatory Visit | Attending: Specialist | Admitting: Specialist

## 2018-03-29 DIAGNOSIS — Z01818 Encounter for other preprocedural examination: Secondary | ICD-10-CM

## 2018-03-29 DIAGNOSIS — Q613 Polycystic kidney, unspecified: Secondary | ICD-10-CM | POA: Diagnosis not present

## 2018-03-29 DIAGNOSIS — I10 Essential (primary) hypertension: Secondary | ICD-10-CM | POA: Insufficient documentation

## 2018-03-29 DIAGNOSIS — Z880 Allergy status to penicillin: Secondary | ICD-10-CM | POA: Diagnosis not present

## 2018-03-29 DIAGNOSIS — Z87891 Personal history of nicotine dependence: Secondary | ICD-10-CM | POA: Diagnosis not present

## 2018-03-29 DIAGNOSIS — M7021 Olecranon bursitis, right elbow: Secondary | ICD-10-CM | POA: Diagnosis present

## 2018-03-29 LAB — CBC
HCT: 33.1 % — ABNORMAL LOW (ref 39.0–52.0)
Hemoglobin: 10.7 g/dL — ABNORMAL LOW (ref 13.0–17.0)
MCH: 26.3 pg (ref 26.0–34.0)
MCHC: 32.3 g/dL (ref 30.0–36.0)
MCV: 81.3 fL (ref 80.0–100.0)
PLATELETS: 233 10*3/uL (ref 150–400)
RBC: 4.07 MIL/uL — AB (ref 4.22–5.81)
RDW: 13.9 % (ref 11.5–15.5)
WBC: 7.6 10*3/uL (ref 4.0–10.5)
nRBC: 0 % (ref 0.0–0.2)

## 2018-03-29 NOTE — Pre-Procedure Instructions (Addendum)
Messaged Dr Amie Critchley on 03-28-18 regarding kidney failure and pt not starting dialysis yet.  Dr Amie Critchley wanting nephrology clearance and wants a K+ checked day of surgery.  Called Sherry at [facility] and left her a detailed message regarding clearance. Faxed this clearance to Dr Sanjuan Dame office and Dr Durward Mallard office with fax confirmation confirmed on both faxes

## 2018-04-01 ENCOUNTER — Other Ambulatory Visit: Payer: Self-pay

## 2018-04-01 ENCOUNTER — Ambulatory Visit: Payer: PRIVATE HEALTH INSURANCE | Admitting: Anesthesiology

## 2018-04-01 ENCOUNTER — Ambulatory Visit
Admission: RE | Admit: 2018-04-01 | Discharge: 2018-04-01 | Disposition: A | Discharge status: Home / Self Care | Payer: PRIVATE HEALTH INSURANCE | Source: Ambulatory Visit | Attending: Specialist | Admitting: Specialist

## 2018-04-01 ENCOUNTER — Encounter: Payer: Self-pay | Admitting: *Deleted

## 2018-04-01 ENCOUNTER — Encounter
Admission: RE | Disposition: A | Discharge status: Home / Self Care | Payer: Self-pay | Source: Ambulatory Visit | Attending: Specialist

## 2018-04-01 DIAGNOSIS — M7021 Olecranon bursitis, right elbow: Secondary | ICD-10-CM | POA: Insufficient documentation

## 2018-04-01 DIAGNOSIS — Z880 Allergy status to penicillin: Secondary | ICD-10-CM | POA: Insufficient documentation

## 2018-04-01 DIAGNOSIS — Z87891 Personal history of nicotine dependence: Secondary | ICD-10-CM | POA: Insufficient documentation

## 2018-04-01 DIAGNOSIS — Q613 Polycystic kidney, unspecified: Secondary | ICD-10-CM | POA: Insufficient documentation

## 2018-04-01 HISTORY — PX: OLECRANON BURSECTOMY: SHX2097

## 2018-04-01 LAB — POCT I-STAT 4, (NA,K, GLUC, HGB,HCT)
Glucose, Bld: 108 mg/dL — ABNORMAL HIGH (ref 70–99)
HCT: 33 % — ABNORMAL LOW (ref 39.0–52.0)
Hemoglobin: 11.2 g/dL — ABNORMAL LOW (ref 13.0–17.0)
Potassium: 3.4 mmol/L — ABNORMAL LOW (ref 3.5–5.1)
Sodium: 141 mmol/L (ref 135–145)

## 2018-04-01 SURGERY — BURSECTOMY, ELBOW
Anesthesia: General | Site: Elbow | Laterality: Right

## 2018-04-01 MED ORDER — BUPIVACAINE HCL (PF) 0.5 % IJ SOLN
INTRAMUSCULAR | Status: DC | PRN
  Administered 2018-04-01: 20 mL

## 2018-04-01 MED ORDER — ONDANSETRON HCL 4 MG/2ML IJ SOLN
INTRAMUSCULAR | Status: DC | PRN
  Administered 2018-04-01: 4 mg via INTRAVENOUS

## 2018-04-01 MED ORDER — CLINDAMYCIN PHOSPHATE 600 MG/50ML IV SOLN
600.0000 mg | INTRAVENOUS | Status: AC
  Administered 2018-04-01: 600 mg via INTRAVENOUS

## 2018-04-01 MED ORDER — GABAPENTIN 400 MG PO CAPS: 400.0000 mg | ORAL_CAPSULE | Freq: Two times a day (BID) | ORAL | 3 refills | Status: DC

## 2018-04-01 MED ORDER — SODIUM CHLORIDE 0.9 % IV SOLN
INTRAVENOUS | Status: DC
  Administered 2018-04-01: 08:00:00 via INTRAVENOUS

## 2018-04-01 MED ORDER — FAMOTIDINE 20 MG PO TABS
ORAL_TABLET | ORAL | Status: AC
  Administered 2018-04-01: 20 mg via ORAL
  Filled 2018-04-01: qty 1

## 2018-04-01 MED ORDER — PROMETHAZINE HCL 25 MG/ML IJ SOLN: 6.2500 mg | INTRAMUSCULAR | Status: DC | PRN

## 2018-04-01 MED ORDER — CEFAZOLIN SODIUM-DEXTROSE 2-4 GM/100ML-% IV SOLN
2.0000 g | INTRAVENOUS | Status: AC
  Administered 2018-04-01: 2 g via INTRAVENOUS

## 2018-04-01 MED ORDER — GABAPENTIN 300 MG PO CAPS
300.0000 mg | ORAL_CAPSULE | ORAL | Status: AC
  Administered 2018-04-01: 300 mg via ORAL

## 2018-04-01 MED ORDER — CEFAZOLIN SODIUM-DEXTROSE 2-4 GM/100ML-% IV SOLN
INTRAVENOUS | Status: AC
  Filled 2018-04-01: qty 100

## 2018-04-01 MED ORDER — ROCURONIUM BROMIDE 50 MG/5ML IV SOLN
INTRAVENOUS | Status: AC
  Filled 2018-04-01: qty 1

## 2018-04-01 MED ORDER — FENTANYL CITRATE (PF) 100 MCG/2ML IJ SOLN
INTRAMUSCULAR | Status: DC | PRN
  Administered 2018-04-01 (×2): 50 ug via INTRAVENOUS

## 2018-04-01 MED ORDER — HYDROCODONE-ACETAMINOPHEN 5-325 MG PO TABS: 1.0000 | ORAL_TABLET | Freq: Four times a day (QID) | ORAL | 0 refills | Status: DC | PRN

## 2018-04-01 MED ORDER — CHLORHEXIDINE GLUCONATE CLOTH 2 % EX PADS: 6.0000 | MEDICATED_PAD | Freq: Once | CUTANEOUS | Status: DC

## 2018-04-01 MED ORDER — MIDAZOLAM HCL 2 MG/2ML IJ SOLN
INTRAMUSCULAR | Status: DC | PRN
  Administered 2018-04-01 (×2): 1 mg via INTRAVENOUS

## 2018-04-01 MED ORDER — GABAPENTIN 300 MG PO CAPS
ORAL_CAPSULE | ORAL | Status: AC
  Administered 2018-04-01: 300 mg via ORAL
  Filled 2018-04-01: qty 1

## 2018-04-01 MED ORDER — CLINDAMYCIN PHOSPHATE 600 MG/50ML IV SOLN
INTRAVENOUS | Status: AC
  Filled 2018-04-01: qty 50

## 2018-04-01 MED ORDER — PROPOFOL 10 MG/ML IV BOLUS
INTRAVENOUS | Status: AC
  Filled 2018-04-01: qty 20

## 2018-04-01 MED ORDER — PROPOFOL 10 MG/ML IV BOLUS
INTRAVENOUS | Status: DC | PRN
  Administered 2018-04-01: 200 mg via INTRAVENOUS
  Administered 2018-04-01: 50 mg via INTRAVENOUS
  Administered 2018-04-01: 40 mg via INTRAVENOUS

## 2018-04-01 MED ORDER — FAMOTIDINE 20 MG PO TABS
20.0000 mg | ORAL_TABLET | Freq: Once | ORAL | Status: AC
  Administered 2018-04-01: 20 mg via ORAL

## 2018-04-01 MED ORDER — EPHEDRINE SULFATE 50 MG/ML IJ SOLN
INTRAMUSCULAR | Status: AC
  Filled 2018-04-01: qty 1

## 2018-04-01 MED ORDER — FENTANYL CITRATE (PF) 100 MCG/2ML IJ SOLN
INTRAMUSCULAR | Status: AC
  Filled 2018-04-01: qty 2

## 2018-04-01 MED ORDER — ONDANSETRON HCL 4 MG/2ML IJ SOLN
INTRAMUSCULAR | Status: AC
  Filled 2018-04-01: qty 2

## 2018-04-01 MED ORDER — GENTAMICIN SULFATE 40 MG/ML IJ SOLN
INTRAMUSCULAR | Status: AC
  Filled 2018-04-01: qty 2

## 2018-04-01 MED ORDER — SODIUM CHLORIDE 0.9 % IV SOLN
INTRAVENOUS | Status: DC | PRN
  Administered 2018-04-01: 1000 mL

## 2018-04-01 MED ORDER — BUPIVACAINE HCL (PF) 0.5 % IJ SOLN
INTRAMUSCULAR | Status: AC
  Filled 2018-04-01: qty 30

## 2018-04-01 MED ORDER — MIDAZOLAM HCL 2 MG/2ML IJ SOLN
INTRAMUSCULAR | Status: AC
  Filled 2018-04-01: qty 2

## 2018-04-01 MED ORDER — LIDOCAINE HCL (PF) 2 % IJ SOLN
INTRAMUSCULAR | Status: AC
  Filled 2018-04-01: qty 10

## 2018-04-01 MED ORDER — FENTANYL CITRATE (PF) 100 MCG/2ML IJ SOLN: 25.0000 ug | INTRAMUSCULAR | Status: DC | PRN

## 2018-04-01 MED ORDER — LIDOCAINE HCL (PF) 1 % IJ SOLN
INTRAMUSCULAR | Status: AC
  Filled 2018-04-01: qty 30

## 2018-04-01 SURGICAL SUPPLY — 40 items
BLADE SURG SZ10 CARB STEEL (BLADE) ×2 IMPLANT
BNDG COHESIVE 4X5 TAN STRL (GAUZE/BANDAGES/DRESSINGS) ×2 IMPLANT
BNDG ESMARK 4X12 TAN STRL LF (GAUZE/BANDAGES/DRESSINGS) ×2 IMPLANT
CANISTER SUCT 1200ML W/VALVE (MISCELLANEOUS) ×2 IMPLANT
CHLORAPREP W/TINT 26 (MISCELLANEOUS) ×2 IMPLANT
COVER WAND RF STERILE (DRAPES) ×2 IMPLANT
CUFF TOURN SGL QUICK 18X4 (TOURNIQUET CUFF) IMPLANT
CUFF TOURN SGL QUICK 24 (TOURNIQUET CUFF) ×1
CUFF TRNQT CYL 24X4X16.5-23 (TOURNIQUET CUFF) ×1 IMPLANT
ELECT REM PT RETURN 9FT ADLT (ELECTROSURGICAL) ×2
ELECTRODE REM PT RTRN 9FT ADLT (ELECTROSURGICAL) ×1 IMPLANT
GLOVE BIO SURGEON STRL SZ8 (GLOVE) ×2 IMPLANT
GLOVE SURG ORTHO 8.5 STRL (GLOVE) ×2 IMPLANT
GOWN STRL REUS W/ TWL LRG LVL3 (GOWN DISPOSABLE) ×1 IMPLANT
GOWN STRL REUS W/TWL LRG LVL3 (GOWN DISPOSABLE) ×1
GOWN STRL REUS W/TWL LRG LVL4 (GOWN DISPOSABLE) ×2 IMPLANT
KIT TURNOVER KIT A (KITS) ×2 IMPLANT
LABEL OR SOLS (LABEL) ×2 IMPLANT
NDL SAFETY ECLIPSE 18X1.5 (NEEDLE) ×1 IMPLANT
NEEDLE FILTER BLUNT 18X 1/2SAF (NEEDLE) ×1
NEEDLE FILTER BLUNT 18X1 1/2 (NEEDLE) ×1 IMPLANT
NEEDLE HYPO 18GX1.5 SHARP (NEEDLE) ×1
NS IRRIG 1000ML POUR BTL (IV SOLUTION) ×2 IMPLANT
PACK EXTREMITY ARMC (MISCELLANEOUS) ×2 IMPLANT
PADDING CAST 4IN STRL (MISCELLANEOUS) ×2
PADDING CAST BLEND 4X4 STRL (MISCELLANEOUS) ×2 IMPLANT
SLING ARM LRG DEEP (SOFTGOODS) ×2 IMPLANT
SPLINT CAST 1 STEP 3X12 (MISCELLANEOUS) ×2 IMPLANT
SPONGE LAP 18X18 RF (DISPOSABLE) ×2 IMPLANT
STAPLER SKIN PROX 35W (STAPLE) ×2 IMPLANT
STOCKINETTE BIAS CUT 4 980044 (GAUZE/BANDAGES/DRESSINGS) ×2 IMPLANT
STOCKINETTE IMPERVIOUS 9X36 MD (GAUZE/BANDAGES/DRESSINGS) ×2 IMPLANT
SUT ETHILON 4-0 (SUTURE) ×1
SUT ETHILON 4-0 FS2 18XMFL BLK (SUTURE) ×1
SUT VIC AB 2-0 CT1 36 (SUTURE) ×2 IMPLANT
SUT VIC AB 4-0 SH 27 (SUTURE) ×1
SUT VIC AB 4-0 SH 27XANBCTRL (SUTURE) ×1 IMPLANT
SUT VICRYL 3-0 27IN (SUTURE) ×2 IMPLANT
SUTURE ETHLN 4-0 FS2 18XMF BLK (SUTURE) ×1 IMPLANT
SYR 10ML LL (SYRINGE) ×2 IMPLANT

## 2018-04-01 NOTE — Anesthesia Post-op Follow-up Note (Signed)
Anesthesia QCDR form completed.        

## 2018-04-01 NOTE — Discharge Instructions (Signed)

## 2018-04-01 NOTE — Op Note (Signed)
04/01/2018  8:51 AM  PATIENT:  Claude Manges  43 y.o. male  PRE-OPERATIVE DIAGNOSIS:  BURSITIS OF OLECRANON OF RIGHT ELBOW  POST-OPERATIVE DIAGNOSIS:  Same  PROCEDURE:  EXCISION, OLECRANON BURSA RIGHT  SURGEON:  Kinslee Dalpe E Yanel Dombrosky MD  ASST:    ANESTHESIA:   General  EBL: None  TOURNIQUET TIME: 21 min  OPERATIVE FINDINGS: Large thickened olecranon bursa filled with blood  OPERATIVE PROCEDURE:  The patient was brought to the operating room and underwent satisfactory general LMA anesthesia in the supine position.  The operative arm was prepped and draped in a  sterile fashion and an Esmarch bandage applied.  Tourniquet was inflated to 250 mmHg.  A posterior midline incision was made over the proximal ulna over the bursa itself. The bursal sac was carefully debrided away from the underlying skin with  which it was intimately adherent.  Bloody fluid was drained from the sac.  The bursal sac was very extensive and was removed in its entirety.  Granulation tissue over the tip of the olecranon and over the fascia was debrided.  The wound was irrigated.  Subcutaneous tissue was closed with 3-0 Vicryl over a vessel loop drain.  Skin was closed with staples. Half percent Marcaine was infiltrated into the wound.  Dry sterile dressing  was applied.  Tourniquet was deflated with good return of blood flow to the hand.  Patient was awakened and taken recovery in good condition.  Park Breed, MD

## 2018-04-01 NOTE — Anesthesia Preprocedure Evaluation (Signed)
Anesthesia Evaluation  Patient identified by MRN, date of birth, ID band Patient awake    Reviewed: Allergy & Precautions, H&P , NPO status , Patient's Chart, lab work & pertinent test results, reviewed documented beta blocker date and time   History of Anesthesia Complications Negative for: history of anesthetic complications  Airway Mallampati: I  TM Distance: >3 FB Neck ROM: full    Dental  (+) Caps, Dental Advidsory Given, Teeth Intact   Pulmonary neg pulmonary ROS, former smoker,           Cardiovascular Exercise Tolerance: Good hypertension, (-) angina(-) CAD, (-) Past MI and (-) Cardiac Stents (-) dysrhythmias (-) Valvular Problems/Murmurs     Neuro/Psych negative neurological ROS  negative psych ROS   GI/Hepatic Neg liver ROS, GERD  ,  Endo/Other  negative endocrine ROS  Renal/GU Renal disease  negative genitourinary   Musculoskeletal   Abdominal   Peds  Hematology  (+) Blood dyscrasia, anemia ,   Anesthesia Other Findings Past Medical History: No date: GERD (gastroesophageal reflux disease)     Comment:  RARE-NO MEDS No date: Hypertension No date: Polycystic kidney     Comment:  hereditary-sees Dr Rockwell [place] [facility]   Reproductive/Obstetrics negative OB ROS                             Anesthesia Physical Anesthesia Plan  ASA: II  Anesthesia Plan: General   Post-op Pain Management:    Induction: Intravenous  PONV Risk Score and Plan: 2 and Ondansetron, Dexamethasone, Midazolam, Promethazine and Treatment may vary due to age or medical condition  Airway Management Planned: LMA  Additional Equipment:   Intra-op Plan:   Post-operative Plan: Extubation in OR  Informed Consent: I have reviewed the patients History and Physical, chart, labs and discussed the procedure including the risks, benefits and alternatives for the proposed anesthesia with the patient or authorized  representative who has indicated his/her understanding and acceptance.     Dental Advisory Given  Plan Discussed with: Anesthesiologist, CRNA and Surgeon  Anesthesia Plan Comments:         Anesthesia Quick Evaluation

## 2018-04-01 NOTE — Transfer of Care (Signed)
Immediate Anesthesia Transfer of Care Note  Patient: Scott Hickman  Procedure(s) Performed: EXCISION, OLECRANON BURSA RIGHT (Right Elbow)  Patient Location: PACU  Anesthesia Type:General  Level of Consciousness: sedated  Airway & Oxygen Therapy: Patient Spontanous Breathing and Patient connected to face mask oxygen  Post-op Assessment: Report given to RN and Post -op Vital signs reviewed and stable  Post vital signs: Reviewed and stable  Last Vitals:  Vitals Value Taken Time  BP 115/69 04/01/2018  9:00 AM  Temp    Pulse 74 04/01/2018  9:01 AM  Resp 17 04/01/2018  9:01 AM  SpO2 100 % 04/01/2018  9:01 AM  Vitals shown include unvalidated device data.  Last Pain:  Vitals:   04/01/18 0859  TempSrc:   PainSc: (P) Asleep         Complications: No apparent anesthesia complications

## 2018-04-01 NOTE — H&P (Signed)
THE PATIENT WAS SEEN PRIOR TO SURGERY TODAY.  HISTORY, ALLERGIES, HOME MEDICATIONS AND OPERATIVE PROCEDURE WERE REVIEWED. RISKS AND BENEFITS OF SURGERY DISCUSSED WITH PATIENT AGAIN.  NO CHANGES FROM INITIAL HISTORY AND PHYSICAL NOTED.    

## 2018-04-01 NOTE — Progress Notes (Signed)
Ch visited pt briefly in pre-op. Pt shared that he was having surgery on his R elbow. Pt also mentioned that he doses not recall injuring his elbow but was glad that he was able to have the surgery to correct it. Ch provided words of encouragement while the care team completed prepping him for the surgery.    04/01/18 0700  Clinical Encounter Type  Visited With Patient  Visit Type Psychological support;Spiritual support;Social support;Pre-op  Spiritual Encounters  Spiritual Needs Emotional;Grief support  Stress Factors  Patient Stress Factors Health changes  Family Stress Factors None identified

## 2018-04-01 NOTE — Anesthesia Postprocedure Evaluation (Signed)
Anesthesia Post Note  Patient: Scott Hickman  Procedure(s) Performed: EXCISION, OLECRANON BURSA RIGHT (Right Elbow)  Patient location during evaluation: PACU Anesthesia Type: General Level of consciousness: awake and alert Pain management: pain level controlled Vital Signs Assessment: post-procedure vital signs reviewed and stable Respiratory status: spontaneous breathing, nonlabored ventilation, respiratory function stable and patient connected to nasal cannula oxygen Cardiovascular status: blood pressure returned to baseline and stable Postop Assessment: no apparent nausea or vomiting Anesthetic complications: no     Last Vitals:  Vitals:   04/01/18 1005 04/01/18 1014  BP: (!) 173/96 (!) 173/92  Pulse: 73 76  Resp: 18 18  Temp:    SpO2: 99% 99%    Last Pain:  Vitals:   04/01/18 1014  TempSrc:   PainSc: 2                  Martha Clan

## 2018-04-02 LAB — SURGICAL PATHOLOGY

## 2018-04-11 MED ORDER — TRAZODONE 50 MG TABLET
ORAL_TABLET | Freq: Every evening | ORAL | 3 refills | 0 days | Status: CP
Start: 2018-04-11 — End: 2018-04-23

## 2018-04-15 DIAGNOSIS — I120 Hypertensive chronic kidney disease with stage 5 chronic kidney disease or end stage renal disease: Principal | ICD-10-CM

## 2018-04-15 DIAGNOSIS — Z01818 Encounter for other preprocedural examination: Principal | ICD-10-CM

## 2018-04-15 DIAGNOSIS — Z125 Encounter for screening for malignant neoplasm of prostate: Principal | ICD-10-CM

## 2018-04-15 DIAGNOSIS — Q613 Polycystic kidney, unspecified: Principal | ICD-10-CM

## 2018-04-15 DIAGNOSIS — Z01810 Encounter for preprocedural cardiovascular examination: Principal | ICD-10-CM

## 2018-04-15 DIAGNOSIS — N186 End stage renal disease: Principal | ICD-10-CM

## 2018-04-15 DIAGNOSIS — Z7682 Awaiting organ transplant status: Principal | ICD-10-CM

## 2018-04-23 MED ORDER — TRAZODONE 50 MG TABLET
ORAL_TABLET | Freq: Every evening | ORAL | 3 refills | 0 days | Status: SS
Start: 2018-04-23 — End: 2018-06-26

## 2018-05-08 ENCOUNTER — Institutional Professional Consult (permissible substitution): Admit: 2018-05-08 | Discharge: 2018-05-09 | Payer: BLUE CROSS/BLUE SHIELD

## 2018-05-08 DIAGNOSIS — N186 End stage renal disease: Secondary | ICD-10-CM

## 2018-05-08 DIAGNOSIS — I120 Hypertensive chronic kidney disease with stage 5 chronic kidney disease or end stage renal disease: Secondary | ICD-10-CM

## 2018-05-08 DIAGNOSIS — N185 Chronic kidney disease, stage 5: Principal | ICD-10-CM

## 2018-05-08 DIAGNOSIS — Z01818 Encounter for other preprocedural examination: Secondary | ICD-10-CM

## 2018-05-08 DIAGNOSIS — Z7682 Awaiting organ transplant status: Secondary | ICD-10-CM

## 2018-05-08 DIAGNOSIS — Q613 Polycystic kidney, unspecified: Secondary | ICD-10-CM

## 2018-05-08 DIAGNOSIS — Z125 Encounter for screening for malignant neoplasm of prostate: Secondary | ICD-10-CM

## 2018-05-08 DIAGNOSIS — Z01810 Encounter for preprocedural cardiovascular examination: Secondary | ICD-10-CM

## 2018-05-09 ENCOUNTER — Institutional Professional Consult (permissible substitution): Admit: 2018-05-09 | Discharge: 2018-05-10 | Payer: BLUE CROSS/BLUE SHIELD

## 2018-05-09 DIAGNOSIS — E872 Acidosis: Secondary | ICD-10-CM

## 2018-05-09 DIAGNOSIS — Q612 Polycystic kidney, adult type: Secondary | ICD-10-CM

## 2018-05-09 DIAGNOSIS — I10 Essential (primary) hypertension: Secondary | ICD-10-CM

## 2018-05-09 DIAGNOSIS — N185 Chronic kidney disease, stage 5: Principal | ICD-10-CM

## 2018-06-13 ENCOUNTER — Encounter: Admit: 2018-06-13 | Discharge: 2018-06-13 | Payer: BLUE CROSS/BLUE SHIELD

## 2018-06-13 DIAGNOSIS — Z01810 Encounter for preprocedural cardiovascular examination: Secondary | ICD-10-CM

## 2018-06-13 DIAGNOSIS — Z7289 Other problems related to lifestyle: Secondary | ICD-10-CM

## 2018-06-13 DIAGNOSIS — Z7682 Awaiting organ transplant status: Secondary | ICD-10-CM

## 2018-06-13 DIAGNOSIS — Z01818 Encounter for other preprocedural examination: Principal | ICD-10-CM

## 2018-06-13 DIAGNOSIS — N186 End stage renal disease: Secondary | ICD-10-CM

## 2018-06-13 DIAGNOSIS — Z125 Encounter for screening for malignant neoplasm of prostate: Secondary | ICD-10-CM

## 2018-06-13 DIAGNOSIS — I120 Hypertensive chronic kidney disease with stage 5 chronic kidney disease or end stage renal disease: Secondary | ICD-10-CM

## 2018-06-13 DIAGNOSIS — Q613 Polycystic kidney, unspecified: Secondary | ICD-10-CM

## 2018-06-13 MED ORDER — PEG 3350-ELECTROLYTES 236 GRAM-22.74 GRAM-6.74 GRAM-5.86 GRAM SOLUTION
Freq: Once | ORAL | 0 refills | 0 days | Status: SS
Start: 2018-06-13 — End: 2018-06-26

## 2018-06-22 ENCOUNTER — Encounter
Admit: 2018-06-22 | Discharge: 2018-06-23 | Payer: BLUE CROSS/BLUE SHIELD | Attending: Internal Medicine | Primary: Internal Medicine

## 2018-06-22 DIAGNOSIS — Z1159 Encounter for screening for other viral diseases: Principal | ICD-10-CM

## 2018-06-24 DIAGNOSIS — I120 Hypertensive chronic kidney disease with stage 5 chronic kidney disease or end stage renal disease: Principal | ICD-10-CM

## 2018-06-25 ENCOUNTER — Ambulatory Visit
Admit: 2018-06-25 | Discharge: 2018-06-28 | Disposition: A | Discharge status: Home / Self Care | Payer: BLUE CROSS/BLUE SHIELD | Admitting: Surgery

## 2018-06-25 ENCOUNTER — Encounter
Admit: 2018-06-25 | Discharge: 2018-06-28 | Disposition: A | Discharge status: Home / Self Care | Payer: BLUE CROSS/BLUE SHIELD | Admitting: Surgery

## 2018-06-25 DIAGNOSIS — I120 Hypertensive chronic kidney disease with stage 5 chronic kidney disease or end stage renal disease: Principal | ICD-10-CM

## 2018-06-25 DIAGNOSIS — Z940 Kidney transplant status: Secondary | ICD-10-CM

## 2018-06-25 HISTORY — PX: KIDNEY TRANSPLANT: SHX239

## 2018-06-25 HISTORY — DX: Kidney transplant status: Z94.0

## 2018-06-26 MED ORDER — PROGRAF 1 MG CAPSULE
ORAL_CAPSULE | Freq: Two times a day (BID) | ORAL | 11 refills | 0.00000 days | Status: CP
Start: 2018-06-26 — End: 2018-06-28

## 2018-06-26 MED ORDER — VALGANCICLOVIR 450 MG TABLET
ORAL_TABLET | Freq: Every day | ORAL | 2 refills | 30 days | Status: CP
Start: 2018-06-26 — End: 2018-09-16
  Filled 2018-06-28: qty 30, 30d supply, fill #0

## 2018-06-26 MED ORDER — MYFORTIC 180 MG TABLET,DELAYED RELEASE
ORAL_TABLET | Freq: Two times a day (BID) | ORAL | 11 refills | 0 days | Status: CP
Start: 2018-06-26 — End: 2018-06-28

## 2018-06-26 NOTE — Unmapped (Addendum)
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 He was taken to the OR on 6/2 for kidney transplantation. He tolerated the procedure well, was extubated in the OR, and was taken to the PACU where he received routine postoperative care. He was then transferred to the Surgical Stepdown unit for close observation and cardiorespiratory monitoring. The allograft was evaluated with ultrasonography and found to have patent renal vasculature. He was initially placed on 1:1 UOP/IVF replacement and then transitioned to normal saline maintenance fluids. He was clinically stable postoperatively, maintaining adequate urine output, and was transferred to the floor on POD#1.   ??  He did well thereafter and  diet was slowly advanced and at the time of discharge he was tolerating a regular diet. The patient was able to void spontaneously following discontinuation of Foley catheter POD#3, have his pain controlled with PO pain medication, and return to the preoperative ambulatory status. Anti-rejection medication levels were monitored and dosages adjusted to maintain a therapeutic regimen. The patient was seen and assessed by Physical Therapy and deemed suitable for discharge. He will be discharged home on POD#3 in stable condition without home healthcare. Both JP drains will remain in place until clinic follow-up.

## 2018-06-27 MED ORDER — GABAPENTIN 100 MG CAPSULE
ORAL_CAPSULE | Freq: Every evening | ORAL | 0 refills | 0.00000 days | Status: CP
Start: 2018-06-27 — End: 2018-07-22
  Filled 2018-06-28: qty 11, 11d supply, fill #0

## 2018-06-27 MED ORDER — MG-PLUS-PROTEIN 133 MG TABLET
ORAL_TABLET | Freq: Two times a day (BID) | ORAL | 11 refills | 0.00000 days | Status: CP
Start: 2018-06-27 — End: 2018-08-07
  Filled 2018-06-28: qty 100, 50d supply, fill #0

## 2018-06-27 MED ORDER — SODIUM BICARBONATE 650 MG TABLET
ORAL_TABLET | Freq: Three times a day (TID) | ORAL | 11 refills | 0 days | Status: CP
Start: 2018-06-27 — End: 2018-08-07
  Filled 2018-06-28: qty 100, 34d supply, fill #0
  Filled 2018-06-28: qty 30, 30d supply, fill #0

## 2018-06-27 MED ORDER — TRAMADOL 50 MG TABLET
ORAL_TABLET | Freq: Two times a day (BID) | ORAL | 0 refills | 0 days | Status: CP | PRN
Start: 2018-06-27 — End: 2018-07-22
  Filled 2018-06-28: qty 30, 7d supply, fill #0

## 2018-06-27 MED ORDER — DOCUSATE SODIUM 100 MG CAPSULE
ORAL_CAPSULE | Freq: Two times a day (BID) | ORAL | 0 refills | 0 days | Status: CP | PRN
Start: 2018-06-27 — End: 2018-07-22
  Filled 2018-06-28: qty 60, 30d supply, fill #0

## 2018-06-27 MED ORDER — ACETAMINOPHEN 500 MG TABLET
ORAL_TABLET | Freq: Four times a day (QID) | ORAL | 0 refills | 0 days | Status: CP | PRN
Start: 2018-06-27 — End: ?

## 2018-06-27 MED ORDER — POLYETHYLENE GLYCOL 3350 17 GRAM ORAL POWDER PACKET
Freq: Every day | ORAL | 0 refills | 0 days | Status: CP | PRN
Start: 2018-06-27 — End: 2018-07-22

## 2018-06-27 MED ORDER — ASPIRIN 81 MG TABLET,DELAYED RELEASE
ORAL_TABLET | Freq: Every day | ORAL | 11 refills | 30.00000 days | Status: CP
Start: 2018-06-27 — End: 2019-06-27
  Filled 2018-06-28: qty 30, 30d supply, fill #0
  Filled 2018-06-28: qty 100, 13d supply, fill #0

## 2018-06-27 NOTE — Unmapped (Signed)
TRANSPLANT SURGERY PROGRESS NOTES  ??  ??  Assessment/Plan:     Ethan West is a 43 y.o. male with history of ESRD 2/2  ADPKD, HTN, status post living donor kidney transplant on 6/2. Doing well post-operatively, renal US unremarkable.  ??  - Regular diet  - OOB today  - Bowel regimen  - Foley out POD 3 vs 5  - Start baby aspirin  - Potential discharge home tomorrow  ??  Subjective / Interval Events:  No acute events overnight. Hypertensive to SBP 160's overnight, resolved with labetalol 10mg . Holding tacrolimus today for elevated level 32.8. Pain well controlled. Voiding appropriately 25mL/hr. BM x 2. Cr down to 5.81 from baseline of 10.    Objective:    Vital signs in last 24 hours:  Temp:  [36.4 ??C (97.5 ??F)-36.8 ??C (98.2 ??F)] 36.8 ??C (98.2 ??F)  Heart Rate:  [77-86] 86  Resp:  [18-21] 18  BP: (125-165)/(74-101) 135/84  MAP (mmHg):  [89-100] 98  SpO2:  [97 %-99 %] 98 %    Intake/Output last 24 hours:  I/O last 3 completed shifts:  In: 11979.2 [P.O.:2060; I.V.:9919.2]  Out: 9515 [Urine:9115; Drains:200; Blood:200]    Physical Exam:  General Appearance:   No acute distress  Lungs:                          Clear to auscultation bilaterally  Heart:                           Regular rate and rhythm  Abdomen:                     Soft, non-tender, non-distended, incision c/d/i. Serosanguineous output from L subcutaneous and L perinephric drain.   Extremities:                  Warm and well perfused      Lawana Chambers, MS4    I attest that I have reviewed the student note and that the components of the history of the present illness, the physical exam, and the assessment and plan documented were performed by me or were performed in my presence by the student where I verified the documentation and performed (or re-performed) the exam and medical decision making.

## 2018-06-27 NOTE — Unmapped (Signed)
Pharmacist Discharge Note for Kidney Transplant Recipient  Date of admission to [facility]: 06/25/2018  Reason for writing this note: new diagnosis with new medication    Reason for Admission: Living kidney transplant recipient on 06/25/2018 s/t polycystic kidney disease    Discharge Date: 06/28/18    Past Medical History:   Diagnosis Date   ??? ADPKD (autosomal dominant polycystic kidney disease)    ??? Hypertension    ??? Kidney stone    ??? Polycystic kidney disease        Immunosuppression regimen:  Prograf 1 mg BID; goal 8-10 ng/mL  Myfortic 540 mg BID    Antimicrobials during admission:   CMV: D+/R + (moderate risk)> Valcyte 450 mg daily  PJP: Bactrim SS MWF x 6 months  Nystatin while inpatient  Peri-op abx: Cefazolin 2g IVx2    Medication changes to be instituted upon discharge:  New medications-   - Bowel regimen: Docusate 100 mg BID PRN and Miralax 17 gm daily PRN  - Pain regimen:  APAP PRN, tramadol PRN, gabapentin x 14 days  - CV/heme: aspirin 81 mg daily    Continued home medications:  -Carvedilol 6.25 mg BID   -Amlodipine 5 mg QD (Note: lower dose than previous home dose)  - Escitalopram 10 mg QD   -Sodium Bicarbonate 650 mg TID     Home medications stopped:   -Calcitriol 0.25 mcg QD    Potential adverse effects during admission  Since last visit, does patient have YES NO Tac Dose Modification NOTES      Increase Decrease No Change    1 Neurotoxicity (tremor, paresthesias, tingling, seizures, or headache) []  [x]  []  []  []     2 Nephrotoxicity related to tacrolimus []  [x]  []  []  []     3 Diarrhea, constipation []  [x]  []  []  []     4 Peripheral edema []  [x]  []  []  []     5 Hypertension [x]  [x]  []  []  []     6 Hyperglycemia [x]  []  []  []  []     7 Other adverse events []  [x]  []  []  []         Suggested monitoring for outpatient follow-up:   -Follow up tacrolimus levels; elevated inpatient; supratherapeutic level post-transplant requiring multiple doses to be held. Restarting tac at 1 mg BID this evening.  -Monitor BG per outpatient labs; instructed patient to fast for first few labs due to need for insulin with high dose steroids    -Monitor BP (elevated inpt); discharged on reduced home dose of amlodipine 5 mg daily and home Coreg 6.25 mg BID    Charlott Rakes,  PharmD Candidate   PY4, [facility] [employer]

## 2018-06-28 LAB — URINALYSIS
BILIRUBIN UA: NEGATIVE
GLUCOSE UA: 150 — AB
PH UA: 7 (ref 5.0–9.0)
PROTEIN UA: 30 — AB
RBC UA: >182 /HPF — ABNORMAL HIGH (ref <=3)
SPECIFIC GRAVITY UA: 1.009 (ref 1.003–1.030)
SQUAMOUS EPITHELIAL: <1 /HPF (ref 0–5)
UROBILINOGEN UA: 0.2
WBC UA: 3 /HPF — ABNORMAL HIGH (ref <=2)

## 2018-06-28 LAB — CBC
HEMATOCRIT: 26.8 % — ABNORMAL LOW (ref 41.0–53.0)
HEMOGLOBIN: 8.6 g/dL — ABNORMAL LOW (ref 13.5–17.5)
MEAN CORPUSCULAR HEMOGLOBIN: 25.5 pg — ABNORMAL LOW (ref 26.0–34.0)
MEAN CORPUSCULAR VOLUME: 80 fL (ref 80.0–100.0)
PLATELET COUNT: 161 10*9/L (ref 150–440)
RED BLOOD CELL COUNT: 3.35 10*12/L — ABNORMAL LOW (ref 4.50–5.90)
WBC ADJUSTED: 9.8 10*9/L (ref 4.5–11.0)

## 2018-06-28 LAB — TACROLIMUS, TROUGH: Lab: 7.6

## 2018-06-28 LAB — BASIC METABOLIC PANEL
ANION GAP: 12 mmol/L (ref 7–15)
BLOOD UREA NITROGEN: 49 mg/dL — ABNORMAL HIGH (ref 7–21)
BUN / CREAT RATIO: 12
CALCIUM: 8.4 mg/dL — ABNORMAL LOW (ref 8.5–10.2)
CHLORIDE: 109 mmol/L — ABNORMAL HIGH (ref 98–107)
CO2: 21 mmol/L — ABNORMAL LOW (ref 22.0–30.0)
CREATININE: 4.18 mg/dL — ABNORMAL HIGH (ref 0.70–1.30)
EGFR CKD-EPI AA MALE: 19 mL/min/{1.73_m2} — ABNORMAL LOW (ref >=60)
EGFR CKD-EPI NON-AA MALE: 16 mL/min/{1.73_m2} — ABNORMAL LOW (ref >=60)
GLUCOSE RANDOM: 145 mg/dL (ref 70–179)
SODIUM: 142 mmol/L (ref 135–145)

## 2018-06-28 LAB — PROTEIN/CREAT RATIO, URINE: Protein/Creatinine:MRto:Pt:Urine:Qn:: 1.004

## 2018-06-28 LAB — BLOOD UREA NITROGEN: Urea nitrogen:MCnc:Pt:Ser/Plas:Qn:: 49 — ABNORMAL HIGH

## 2018-06-28 LAB — MAGNESIUM: Magnesium:MCnc:Pt:Ser/Plas:Qn:: 2.1

## 2018-06-28 LAB — MEAN CORPUSCULAR VOLUME: Lab: 80

## 2018-06-28 LAB — PROTEIN / CREATININE RATIO, URINE: PROTEIN URINE: 51.7 mg/dL

## 2018-06-28 LAB — UROBILINOGEN UA: Lab: 0.2

## 2018-06-28 LAB — PHOSPHORUS: Phosphate:MCnc:Pt:Ser/Plas:Qn:: 4.3

## 2018-06-28 MED ORDER — EQUATE NICOTINE 4 MG MT GUM
4.00 | CHEWING_GUM | OROMUCOSAL | Status: DC
Start: ? — End: 2018-06-28

## 2018-06-28 MED ORDER — ASPIRIN 81 MG PO CHEW
81.00 | CHEWABLE_TABLET | ORAL | Status: DC
Start: 2018-06-29 — End: 2018-06-28

## 2018-06-28 MED ORDER — GLUCOSAMINE-CHONDROIT-COLLAGEN PO
100.00 | ORAL | Status: DC
Start: 2018-06-28 — End: 2018-06-28

## 2018-06-28 MED ORDER — SODIUM BICARBONATE 650 MG PO TABS
650.00 | ORAL_TABLET | ORAL | Status: DC
Start: 2018-06-28 — End: 2018-06-28

## 2018-06-28 MED ORDER — BENICAR 20 MG PO TABS
12.50 | ORAL_TABLET | ORAL | Status: DC
Start: ? — End: 2018-06-28

## 2018-06-28 MED ORDER — CHLOROPHYLL EX
5.00 | CUTANEOUS | Status: DC
Start: ? — End: 2018-06-28

## 2018-06-28 MED ORDER — ACETAMINOPHEN 500 MG PO TABS
1000.00 | ORAL_TABLET | ORAL | Status: DC
Start: 2018-06-28 — End: 2018-06-28

## 2018-06-28 MED ORDER — ESCITALOPRAM OXALATE 10 MG PO TABS
10.00 | ORAL_TABLET | ORAL | Status: DC
Start: 2018-06-29 — End: 2018-06-28

## 2018-06-28 MED ORDER — NYSTATIN 100000 UNIT/ML MT SUSP
10.00 | OROMUCOSAL | Status: DC
Start: 2018-06-28 — End: 2018-06-28

## 2018-06-28 MED ORDER — POLYETHYLENE GLYCOL 3350 17 G PO PACK
17.00 | PACK | ORAL | Status: DC
Start: 2018-06-29 — End: 2018-06-28

## 2018-06-28 MED ORDER — FAMOTIDINE 20 MG PO TABS
20.00 | ORAL_TABLET | ORAL | Status: DC
Start: 2018-06-29 — End: 2018-06-28

## 2018-06-28 MED ORDER — GENERIC EXTERNAL MEDICATION
Status: DC
Start: ? — End: 2018-06-28

## 2018-06-28 MED ORDER — AMLODIPINE BESYLATE 5 MG PO TABS
5.00 | ORAL_TABLET | ORAL | Status: DC
Start: 2018-06-29 — End: 2018-06-28

## 2018-06-28 MED ORDER — PRONUTRA PO POWD
450.00 | ORAL | Status: DC
Start: ? — End: 2018-06-28

## 2018-06-28 MED ORDER — BROMHIST-NR PO
0.00 | ORAL | Status: DC
Start: 2018-06-28 — End: 2018-06-28

## 2018-06-28 MED ORDER — BANATROL PO
6.25 | ORAL | Status: DC
Start: 2018-06-28 — End: 2018-06-28

## 2018-06-28 MED ORDER — SULFAMETHOXAZOLE-TRIMETHOPRIM 400-80 MG PO TABS
1.00 | ORAL_TABLET | ORAL | Status: DC
Start: 2018-07-01 — End: 2018-06-28

## 2018-06-28 MED ORDER — GABAPENTIN 100 MG PO CAPS
100.00 | ORAL_CAPSULE | ORAL | Status: DC
Start: 2018-06-28 — End: 2018-06-28

## 2018-06-28 MED ORDER — HYDRALAZINE HCL 20 MG/ML IJ SOLN
10.00 | INTRAMUSCULAR | Status: DC
Start: ? — End: 2018-06-28

## 2018-06-28 MED ORDER — MYCOPHENOLATE MOFETIL 250 MG PO CAPS
750.00 | ORAL_CAPSULE | ORAL | Status: DC
Start: 2018-06-28 — End: 2018-06-28

## 2018-06-28 MED ORDER — ANTI-OXIDANT TAKE ONE PO
7500.00 | ORAL | Status: DC
Start: 2018-06-28 — End: 2018-06-28

## 2018-06-28 MED ORDER — TACROLIMUS 1 MG CAPSULE
ORAL_CAPSULE | Freq: Two times a day (BID) | ORAL | 3 refills | 0.00000 days | Status: CP
Start: 2018-06-28 — End: 2018-06-28

## 2018-06-28 MED ORDER — PROGRAF 1 MG CAPSULE
ORAL_CAPSULE | Freq: Two times a day (BID) | ORAL | 11 refills | 0.00000 days | Status: CP
Start: 2018-06-28 — End: 2018-06-28
  Filled 2018-06-28: qty 30, 3d supply, fill #0

## 2018-06-28 MED ORDER — TACROLIMUS 1 MG CAPSULE: 5 mg | capsule | Freq: Two times a day (BID) | 3 refills | 0 days | Status: AC

## 2018-06-28 MED ORDER — MYCOPHENOLATE SODIUM 180 MG TABLET,DELAYED RELEASE: 540 mg | tablet | Freq: Two times a day (BID) | 11 refills | 0 days | Status: AC

## 2018-06-28 MED ORDER — TACROLIMUS 1 MG CAPSULE: 5 mg | capsule | Freq: Two times a day (BID) | 11 refills | 0 days | Status: AC

## 2018-06-28 MED ORDER — SULFAMETHOXAZOLE 400 MG-TRIMETHOPRIM 80 MG TABLET
ORAL_TABLET | ORAL | 5 refills | 28.00000 days | Status: CP
Start: 2018-06-28 — End: 2018-12-25
  Filled 2018-06-28: qty 12, 28d supply, fill #0

## 2018-06-28 MED ORDER — AMLODIPINE 10 MG TABLET
ORAL_TABLET | Freq: Every day | ORAL | 11 refills | 0 days | Status: CP
Start: 2018-06-28 — End: 2018-07-05
  Filled 2018-06-28: qty 15, 30d supply, fill #0

## 2018-06-28 MED ORDER — MYCOPHENOLATE SODIUM 180 MG TABLET,DELAYED RELEASE
ORAL_TABLET | Freq: Two times a day (BID) | ORAL | 3 refills | 0.00000 days | Status: CP
Start: 2018-06-28 — End: 2018-07-22
  Filled 2018-06-28: qty 180, 30d supply, fill #0

## 2018-06-28 MED FILL — MYFORTIC 180 MG TABLET,DELAYED RELEASE: 30 days supply | Qty: 180 | Fill #0 | Status: AC

## 2018-06-28 MED FILL — SULFAMETHOXAZOLE 400 MG-TRIMETHOPRIM 80 MG TABLET: 28 days supply | Qty: 12 | Fill #0 | Status: AC

## 2018-06-28 MED FILL — SODIUM BICARBONATE 650 MG TABLET: 34 days supply | Qty: 100 | Fill #0 | Status: AC

## 2018-06-28 MED FILL — PROGRAF 1 MG CAPSULE: 3 days supply | Qty: 30 | Fill #0 | Status: AC

## 2018-06-28 MED FILL — DOK 100 MG CAPSULE: 30 days supply | Qty: 60 | Fill #0 | Status: AC

## 2018-06-28 MED FILL — ASPIRIN 81 MG TABLET,DELAYED RELEASE: 30 days supply | Qty: 30 | Fill #0 | Status: AC

## 2018-06-28 MED FILL — GABAPENTIN 100 MG CAPSULE: 11 days supply | Qty: 11 | Fill #0 | Status: AC

## 2018-06-28 MED FILL — VALGANCICLOVIR 450 MG TABLET: 30 days supply | Qty: 30 | Fill #0 | Status: AC

## 2018-06-28 MED FILL — AMLODIPINE 10 MG TABLET: 30 days supply | Qty: 15 | Fill #0 | Status: AC

## 2018-06-28 MED FILL — POLYETHYLENE GLYCOL 3350 17 GRAM ORAL POWDER PACKET: 30 days supply | Qty: 30 | Fill #0 | Status: AC

## 2018-06-28 MED FILL — TRAMADOL 50 MG TABLET: 7 days supply | Qty: 30 | Fill #0 | Status: AC

## 2018-06-28 MED FILL — ACETAMINOPHEN 500 MG TABLET: 13 days supply | Qty: 100 | Fill #0 | Status: AC

## 2018-06-28 MED FILL — MG-PLUS-PROTEIN 133 MG TABLET: 50 days supply | Qty: 100 | Fill #0 | Status: AC

## 2018-06-28 NOTE — Unmapped (Signed)
POC reviewed with pt. VSS. UOP adequate, TOV completed.??No reports of pain.??Tolerating diet without N/V. Transplant education completed. Discharge instructions completed. Pt had no further questions. Continue to monitor.         Problem: Wound  Goal: Optimal Wound Healing  Outcome: Progressing     Problem: Adult Inpatient Plan of Care  Goal: Plan of Care Review  Outcome: Progressing  Goal: Patient-Specific Goal (Individualization)  Outcome: Progressing  Goal: Absence of Hospital-Acquired Illness or Injury  Outcome: Progressing  Goal: Optimal Comfort and Wellbeing  Outcome: Progressing  Goal: Readiness for Transition of Care  Outcome: Progressing  Goal: Rounds/Family Conference  Outcome: Progressing     Problem: Infection  Goal: Infection Symptom Resolution  Outcome: Progressing     Problem: Skin Injury Risk Increased  Goal: Skin Health and Integrity  Outcome: Progressing     Problem: Pain Acute  Goal: Optimal Pain Control  Outcome: Progressing     Problem: Hypertension Comorbidity  Goal: Blood Pressure in Desired Range  Outcome: Progressing     Problem: Obstructive Sleep Apnea Risk or Actual (Comorbidity Management)  Goal: Unobstructed Breathing During Sleep  Outcome: Progressing

## 2018-06-28 NOTE — Unmapped (Signed)
Education Follow up Assessment (Patient may utilize resources: family, booklet, med list.)   Re-educate on all incorrect responses and reassess those at conclusion of session.       1) Who do you call on nights, weekends, and holidays if you are having a transplant issue? On call coord     2) Name 2 reasons to call the on-call coordinator. Fever and pain    3) When do you take your morning medicines on lab draw days? after     4) Can you name one your antirejection medicines? yes    5) Can you name one your antiinfectious medicines? yes    6) What are you responsible for monitoring when you are at home? Bp, wt, t, I/Os     7) How do you prevent rejection? take my medicines on time or equivalent    8) Have you spoken with a dietician?  yes     9) Has your diet changed from before transplant?  yes    10) Have you spoken with a pharmacist?  yes    11) Do you feel comfortable going home?  yes    Loma Boston, RN Inpatient Abdominal Transplant Nurse Coordinator 06/28/2018 2:29 PM

## 2018-06-28 NOTE — Unmapped (Signed)
[facility] Pharmacist Intervention    Type of intervention: patient enrollment    Medication: all medications    Problem: received notification that patient will fill refills at [facility] once discharged    Intervention: enrolled patient in specialty calls and set onboarding call for next week once patient has been discharged    Follow up needed: will contact patient post discharge    Approximate time spent: 10 minutes    Thad Ranger   [facility] Specialty Pharmacist

## 2018-06-28 NOTE — Unmapped (Signed)
Discharge Summary    Admit date: 06/25/2018    Discharge date and time: 06/28/2018    Discharge to:  Home    Discharge Service: Surg Transplant [facility])    Discharge Attending Physician: Particia Nearing, MD    Discharge  Diagnoses: ESRD    Secondary Diagnosis: Active Problems:    Kidney transplant status, living related donor POA: Not Applicable  Resolved Problems:    * No resolved hospital problems. *      OR Procedures:    Priority RENAL ALLOTRANSPLANTATION, IMPLANTATION OF GRAFT; WITHOUT RECIPIENT NEPHRECTOMY  BACKBNCH STD PREP CAD DONR RENAL ALLOGFT PRIOR TO TRNSPLNT, INCL DISSEC/REM PERINEPH FAT, DIAPH/RTPER ATTAC  Date  06/25/2018  -------------------     Ancillary Procedures: None    Discharge Day Services: The patient was seen and examined by the Surgery team on the day of discharge. Vital signs and laboratory values were stable and within normal limits. Wounds were examined. Discharge plan was discussed, instructions were given and all questions answered.    Subjective   No acute events overnight. Pain Controlled. No fever or chills. Tolerating a diet. .    Objective   Patient Vitals for the past 8 hrs:   BP Temp Temp src Pulse Resp SpO2   06/28/18 1217 155/91 36.7 ??C Oral 68 18 100 %   06/28/18 0841 165/100 37 ??C Oral 69 18 100 %     I/O this shift:  In: 240 [P.O.:240]  Out: 2050 [Urine:1995; Drains:55]    General:  Pleasant, cooperative, appears stated age, in NAD  Resp:  Appears comfortable on room air. No increased work of breathing.   Abdomen:  Soft, appropriately tender. Non-distended. Incisions c/d/i with staples. Surgical drains x 2 in place with SS output.   Extremities: Warm, well-perfused. No edema.   Neuro:   No focal deficits. AAOx3.       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 He was taken to the OR on 6/2 for kidney transplantation. He tolerated the procedure well, was extubated in the OR, and was taken to the PACU where he received routine postoperative care. He was then transferred to the Surgical Stepdown unit for close observation and cardiorespiratory monitoring. The allograft was evaluated with ultrasonography and found to have patent renal vasculature. He was initially placed on 1:1 UOP/IVF replacement and then transitioned to normal saline maintenance fluids. He was clinically stable postoperatively, maintaining adequate urine output, and was transferred to the floor on POD#1.   ??  He did well thereafter and  diet was slowly advanced and at the time of discharge he was tolerating a regular diet. The patient was able to void spontaneously following discontinuation of Foley catheter POD#3, have his pain controlled with PO pain medication, and return to the preoperative ambulatory status. Anti-rejection medication levels were monitored and dosages adjusted to maintain a therapeutic regimen. The patient was seen and assessed by Physical Therapy and deemed suitable for discharge. He will be discharged home on POD#3 in stable condition without home healthcare. Both JP drains will remain in place until clinic follow-up.       Condition at Discharge: Improved  Discharge Medications:      Medication List      START taking these medications    ??? acetaminophen 500 MG tablet; Commonly known as: TYLENOL; Take 2 tablets   (1,000 mg total) by mouth every six (6) hours  as needed for pain.  ??? aspirin 81 MG tablet; Commonly known as: ECOTRIN; Take 1 tablet (81 mg   total) by mouth daily.  ??? DOK 100 MG capsule; Generic drug: docusate sodium; Take 1 capsule (100   mg total) by mouth two (2) times a day as needed for constipation.  ??? gabapentin 100 MG capsule; Commonly known as: NEURONTIN; Take 1 capsule   (100 mg total) by mouth nightly for 11 days.  ??? magnesium (amino acid chelate) 133 mg Tab; Generic drug: magnesium   oxide-Mg AA chelate; Take 1 tablet by mouth Two (2) times a day. HOLD   until directed to start by your coordinator.  ??? mycophenolate 180 MG EC tablet; Commonly known as: MYFORTIC; Take 3   tablets (540 mg total) by mouth Two (2) times a day.  ??? polyethylene glycol 17 gram packet; Commonly known as: MIRALAX; Take 17   g (1 packet mixed in 4-8 oz of liquid)  by mouth daily as needed.  ??? sulfamethoxazole-trimethoprim 400-80 mg per tablet; Commonly known as:   BACTRIM; Take 1 tablet (80 mg of trimethoprim total) by mouth Every   Monday, Wednesday, and Friday.  ??? tacrolimus 1 MG capsule; Commonly known as: PROGRAF; Take 1 capsule (1   mg total) by mouth two (2) times a day.  ??? traMADoL 50 mg tablet; Commonly known as: ULTRAM; Take 1-2 tablets   (50-100 mg total) by mouth every twelve (12) hours as needed.  ??? valGANciclovir 450 mg tablet; Commonly known as: VALCYTE; Take 1 tablet   (450 mg total) by mouth daily.     CHANGE how you take these medications    ??? amLODIPine 10 MG tablet; Commonly known as: NORVASC; Take 0.5 tablets (5   mg total) by mouth daily.; What changed: how much to take  ??? escitalopram oxalate 10 MG tablet; Commonly known as: LEXAPRO; Take 1   tablet (10 mg total) by mouth daily.; What changed: when to take this  ??? sodium bicarbonate 650 mg tablet; Take 1 tablet (650 mg total) by mouth   Three (3) times a day.; What changed: how much to take, when to take this     CONTINUE taking these medications    ??? carvediloL 6.25 MG tablet; Commonly known as: COREG; Take 1 tablet (6.25   mg total) by mouth Two (2) times a day.     STOP taking these medications    ??? calcitrioL 0.25 MCG capsule; Commonly known as: ROCALTROL  ??? oxyCODONE 5 MG immediate release tablet; Commonly known as: ROXICODONE       Pending Test Results: None    Discharge Instructions:    Other Instructions     Discharge instructions      Activity: Do not lift > 10-15 lbs for 1st 6 weeks, then gradually increase to 25 lbs over the following 6 weeks. Resume heavy lifting only after being cleared to do so at follow-up appointment in Transplant Surgery clinic.    Diet: Regular    Other Instructions: Continue to monitor and record JP drain output daily or as needed to empty.     Your Post-Transplant Coordinator is Sinda Du. Contact your transplant coordinator or the Transplant Surgery Office [phone]) during business hours or page the transplant coordinator on call [phone]) after business hours for:    - fever >100.5 degrees F by mouth, any fever with shaking chills, or other signs or symptoms of infection   - uncontrolled nausea, vomiting, or diarrhea; inability to  have a bowel movement for > 3 days.   - any problem that prevents taking medications as scheduled.   - pain uncontrolled with prescribed medication or new pain or tenderness at the surgical site   - sudden weight gain or increase in blood pressure (greater than 140/85)   - shortness of breath, chest pain / discomfort   - new or increasing jaundice   - urinary symptoms including pain / difficulty / burning or tea-colored urine   - any other new or concerning symptoms   - questions regarding your medications or continuing care      Patient may shower, but should not immerse wounds in bath or pool for 2-3 weeks. Wash the surgical site with mild soap and water, but do not scrub vigorously.    You may dress wounds with dry gauze and tape to avoid soilage.    Do not drive or operate heavy machinery prior to MD clearance, or at any time while taking narcotics.    Inspect surgical sites at least twice daily, contact Transplant Coordinator for spreading redness, purulent discharge, or increasing bleeding or drainage, or for separation of wounds.     Maintain a written record of daily vital signs, per Handbook instructions.     Maintain a written record of medications taken and review against the discharge medications sheet (orange paper). Periodically review your Transplant Handbook for important information regarding postoperative care and required precautions.      Labs and Other Follow-ups after Discharge:   Kidney  Labs 3x week: CBC, BMP, Mg, Phos and Tacrolimus trough  Labs every 3 months: Hepatic function panel    Transplant Coordinator:  Sinda Du- phone: [phone] fax: [phone]             Labs and Other Follow-ups after Discharge:  Follow Up instructions and Outpatient Referrals     Discharge instructions            Future Appointments:  Appointments which have been scheduled for you    Jul 08, 2018  9:00 AM EDT  (Arrive by 8:30 AM)  LAB ONLY with SURTRA LAB ONLY  [facility] TRANSPLANT SURGERY [facility] REGION) [address]  [place] [address]  [phone]      Jul 08, 2018  9:20 AM EDT  (Arrive by 8:50 AM)  RETURN 20 with Particia Nearing, MD  [facility] TRANSPLANT SURGERY [facility] REGION) [address]  [place] [address]  [phone]      Jul 08, 2018  9:40 AM EDT  (Arrive by 9:10 AM)  Nila Nephew W MD with Wallace Cullens Mincemoyer, CPP  [facility] TRANSPLANT SURGERY  [facility] REGION) [address]  [place] [address]  [phone]

## 2018-06-28 NOTE — Unmapped (Signed)
Pt recently transplanted    ESRD:Polycystic Kidneys   HX/ Comorbidities: HTN, chronic headaches, past appy, partial colectomy, diverticulitis  Diagnostic testing needing follow up: routine  HM: routine  SW/Psychosocial concerns: routine  See FYI's     Attempted to get colectomy records from [facility] per request at pre op visit with transplant surgeon for years 2000-2002 they have no records per their review.  Of. note pt thought his surgery was 2001, but no record of any procedure that year

## 2018-06-28 NOTE — Unmapped (Signed)
Plan of care reviewed with pt. VSS. Urine output remains adequate through foley catheter. No reports of pain this shift. Pt continues tolerating diet without report of N/V this shift. Potential for discharge 6/5. No pt questions/concerns at this time.        Problem: Wound  Goal: Optimal Wound Healing  Outcome: Progressing     Problem: Adult Inpatient Plan of Care  Goal: Plan of Care Review  Outcome: Progressing  Goal: Patient-Specific Goal (Individualization)  Outcome: Progressing  Goal: Absence of Hospital-Acquired Illness or Injury  Outcome: Progressing  Goal: Optimal Comfort and Wellbeing  Outcome: Progressing  Goal: Readiness for Transition of Care  Outcome: Progressing  Goal: Rounds/Family Conference  Outcome: Progressing     Problem: Infection  Goal: Infection Symptom Resolution  Outcome: Progressing     Problem: Skin Injury Risk Increased  Goal: Skin Health and Integrity  Outcome: Progressing     Problem: Pain Acute  Goal: Optimal Pain Control  Outcome: Progressing     Problem: Hypertension Comorbidity  Goal: Blood Pressure in Desired Range  Outcome: Progressing     Problem: Obstructive Sleep Apnea Risk or Actual (Comorbidity Management)  Goal: Unobstructed Breathing During Sleep  Outcome: Progressing

## 2018-06-28 NOTE — Unmapped (Signed)
Tacrolimus Therapeutic Monitoring Pharmacy Note    Ethan West is a 43 y.o. male continuing tacrolimus.     Indication: Kidney transplant     Date of Transplant: 06/25/2018      Prior Dosing Information: prior regimen of 5 mg BID held for three doses due to elevated tac levels     Goals:  Therapeutic Drug Levels  Tacrolimus trough goal: 8-10 ng/mL    Additional Clinical Monitoring/Outcomes  ?? Monitor renal function (SCr and urine output) and liver function (LFTs)  ?? Monitor for signs/symptoms of adverse events (e.g., hyperglycemia, hyperkalemia, hypomagnesemia, hypertension, headache, tremor)    Results:   Tacrolimus level: 7.6 ng/mL, drawn appropriately    Pharmacokinetic Considerations and Significant Drug Interactions:  ? Concurrent hepatotoxic medications: None identified  ? Concurrent CYP3A4 substrates/inhibitors: None identified  ? Concurrent nephrotoxic medications: Bactrim    Assessment/Plan:  Recommendedation(s)  ? Start 1 mg BID    Follow-up  ? Follow up with AM tac levels if still inpt. If discharged, pt will follow up outpatient with labs next week..   ? A pharmacist will continue to monitor and recommend levels as appropriate    Please page service pharmacist with questions/clarifications.    Charlott Rakes, PharmD Candidate   PY4, [employer]

## 2018-06-29 NOTE — Unmapped (Signed)
POST DISCHARGE FOLLOW UP CALL  Call to patient today to check on status since discharge. The following questions were asked:     How are you feeling right now? Is your pain controlled? yes    Are you taking your pain medication? no    Speaking of medicine, do you have all of your medications? yes Are there medications on your medication list that are missing? yes, tacrolimus from [facility]    Do you have a BP cuff, scale and thermometer? yes    How is your BP? (Unless instructed otherwise, patient is supposed to call if > 160/100 or < 100/60)lst night 156/ 98 or 142/91    Do you have any new swelling? no, not really paying attention, maybe in his belly  Is your swelling the same?...worse?Marland Kitchen...or better? Then when you left the hospital.the same    Are you eating and drinking Ok? yes    Any nausea, vomiting or diarrhea? no    Are you passing your urine OK? yes    Did you go home with your bladder urinary (foley) catheter ? no  Or JP drain? yes  How is the output from your urinary catheter or JP? No change and manageable    Any issues with your wound?Marland Kitchen..drainage?...have you looked at your incision? clean, dry, intact    Any other questions or concerns? For labs on Monday, he will go to [facility] since I do not know if the orders are visible at [facility]. I will call [facility] on Monday to confirm he can go there on Wednesday.

## 2018-06-30 NOTE — Unmapped (Signed)
Pt called on call TNC re: recent feeling of urgency with urination and two episodes of incontinence over the weekend. He reports he is afebrile (98.7), BP 133/96 this morning. Has taken 1 dose tramadol for incisional pain. He's drinking 7-8 16oz bottles of water per day. UOP approx per episode with no pain or burning, but feels urgency about 30 mins after urination. He states he's trying to hold it as long as possible, but goes again shortly after. He reports he's never had any issues with his prostate and was not anuric prior to transplant. I reassured him that his output is good and the urgency is new, but should even out. I advised him that I will add urine orders to his labs for tomorrow at [facility] and make his Primary TNC and txp team aware of the urgency and incontinence. He was appreciative of the support and will ensure labs/urine are complete Monday morning.

## 2018-07-01 ENCOUNTER — Encounter: Admit: 2018-07-01 | Discharge: 2018-07-02 | Payer: BLUE CROSS/BLUE SHIELD

## 2018-07-01 DIAGNOSIS — Z79899 Other long term (current) drug therapy: Secondary | ICD-10-CM

## 2018-07-01 DIAGNOSIS — Z940 Kidney transplant status: Principal | ICD-10-CM

## 2018-07-01 DIAGNOSIS — N3941 Urge incontinence: Secondary | ICD-10-CM

## 2018-07-01 LAB — URINALYSIS
BACTERIA: NONE SEEN /HPF
BILIRUBIN UA: NEGATIVE
GLUCOSE UA: NEGATIVE
KETONES UA: NEGATIVE
NITRITE UA: NEGATIVE
PH UA: 6 (ref 5.0–9.0)
PROTEIN UA: 30 — AB
RBC UA: >100 /HPF — ABNORMAL HIGH (ref <3)
SPECIFIC GRAVITY UA: 1.02 (ref 1.005–1.040)
SQUAMOUS EPITHELIAL: 3 /HPF (ref 0–5)
UROBILINOGEN UA: 0.2
WBC UA: >100 /HPF — ABNORMAL HIGH (ref <2)

## 2018-07-01 LAB — CBC W/ AUTO DIFF
BASOPHILS ABSOLUTE COUNT: 0 10*9/L (ref 0.0–0.1)
BASOPHILS RELATIVE PERCENT: 0.1 %
EOSINOPHILS ABSOLUTE COUNT: 0 10*9/L (ref 0.0–0.4)
EOSINOPHILS RELATIVE PERCENT: 0.6 %
HEMATOCRIT: 31 % — ABNORMAL LOW (ref 41.0–53.0)
HEMOGLOBIN: 10.2 g/dL — ABNORMAL LOW (ref 13.5–17.5)
LARGE UNSTAINED CELLS: 1 % (ref 0–4)
LYMPHOCYTES ABSOLUTE COUNT: 0 10*9/L — ABNORMAL LOW (ref 1.5–5.0)
MEAN CORPUSCULAR HEMOGLOBIN CONC: 32.9 g/dL (ref 31.0–37.0)
MEAN CORPUSCULAR HEMOGLOBIN: 26.3 pg (ref 26.0–34.0)
MEAN CORPUSCULAR VOLUME: 80 fL (ref 80.0–100.0)
MEAN PLATELET VOLUME: 7.2 fL (ref 7.0–10.0)
MONOCYTES ABSOLUTE COUNT: 0.3 10*9/L (ref 0.2–0.8)
MONOCYTES RELATIVE PERCENT: 4.3 %
NEUTROPHILS ABSOLUTE COUNT: 5.6 10*9/L (ref 2.0–7.5)
NEUTROPHILS RELATIVE PERCENT: 93.7 %
PLATELET COUNT: 164 10*9/L (ref 150–440)
RED CELL DISTRIBUTION WIDTH: 14.7 % (ref 12.0–15.0)
WBC ADJUSTED: 6 10*9/L (ref 4.5–11.0)

## 2018-07-01 LAB — PROTEIN / CREATININE RATIO, URINE: CREATININE, URINE: 111.2 mg/dL

## 2018-07-01 LAB — RENAL FUNCTION PANEL
ALBUMIN: 3.8 g/dL (ref 3.5–5.0)
ANION GAP: 12 mmol/L (ref 7–15)
BLOOD UREA NITROGEN: 56 mg/dL — ABNORMAL HIGH (ref 7–21)
BUN / CREAT RATIO: 19
CHLORIDE: 108 mmol/L — ABNORMAL HIGH (ref 98–107)
CO2: 22 mmol/L (ref 22.0–30.0)
CREATININE: 2.9 mg/dL — ABNORMAL HIGH (ref 0.70–1.30)
EGFR CKD-EPI AA MALE: 29 mL/min/{1.73_m2} — ABNORMAL LOW (ref >=60)
EGFR CKD-EPI NON-AA MALE: 26 mL/min/{1.73_m2} — ABNORMAL LOW (ref >=60)
GLUCOSE RANDOM: 101 mg/dL (ref 70–179)
PHOSPHORUS: 3.6 mg/dL (ref 2.9–4.7)
POTASSIUM: 4 mmol/L (ref 3.5–5.0)
SODIUM: 142 mmol/L (ref 135–145)

## 2018-07-01 LAB — PROTEIN UA: Protein:MCnc:Pt:Urine:Qn:Test strip: 30 — AB

## 2018-07-01 LAB — CREATININE, URINE: Lab: 111.2

## 2018-07-01 LAB — BASOPHILS ABSOLUTE COUNT: Lab: 0

## 2018-07-01 LAB — TACROLIMUS BLOOD: Lab: 2.1

## 2018-07-01 LAB — BLOOD UREA NITROGEN: Urea nitrogen:MCnc:Pt:Ser/Plas:Qn:: 56 — ABNORMAL HIGH

## 2018-07-01 LAB — MAGNESIUM: Magnesium:MCnc:Pt:Ser/Plas:Qn:: 2.2

## 2018-07-01 MED ORDER — CEFPODOXIME 200 MG TABLET
ORAL_TABLET | Freq: Two times a day (BID) | ORAL | 0 refills | 0 days | Status: CP
Start: 2018-07-01 — End: 2018-07-22

## 2018-07-01 MED ORDER — TACROLIMUS 1 MG CAPSULE
ORAL_CAPSULE | 3 refills | 0 days | Status: CP
Start: 2018-07-01 — End: 2018-07-03

## 2018-07-01 NOTE — Unmapped (Signed)
Contacted [facility] to determine status of prescriptions. They were on hold to find out if he had Medicare. I confirmed that the patient did not have Medicare. They said that they will be able to set up delivery overnight as soon as this has processed on their side. I will let patient know he can call them tonight or tomorrow morning to set up delivery. Loma Boston, RN Inpatient Transplant Nurse Coordinator 07/01/2018 1:11 PM

## 2018-07-01 NOTE — Unmapped (Addendum)
6/10 update: prograf and myfortic rx sent to [facility] per epic med list. Patient will be onboarded for valgan at [facility] this week-ef    [facility] Pharmacist Intervention    Type of intervention: insurance     Medication: prograf    Problem: patient set up for onboarding welcome call to [facility] this week. Note from triage that prograf requires [facility] specialty, and that this information was given to clinic. However patient also has valcyte on active med list    Intervention: will leave patient enrolled at this time with onboarding set up for this week to see if he wants to fill the allowed per insurance medications at [facility]    Follow up needed: will onboard/welcome this week    Approximate time spent: 10 minutes    Thad Ranger   [facility] Specialty Pharmacist

## 2018-07-01 NOTE — Unmapped (Signed)
Called patient to let him know that he can get his labs drawn locally at [facility] [place]. He is schedule don Wednesday at 8am with Aggie Cosier, RN. He understands that if there is a delay in lab results, he will be asked to go to [facility].   I asked him to call [facility] to set up delivery sometime today after 4pm. I instructed him that he needs to continue to call them until he gets his first delivery. Once they have successfully delivered once, they usually are set. He confirmed that he would contact them this afternoon and tomorrow. Loma Boston, RN Inpatient Transplant Nurse Coordinator 07/01/2018 1:28 PM

## 2018-07-01 NOTE — Unmapped (Signed)
U/A reviewed with Dr. Farrel Gobble, ordered cefpodoxime 200mg  bid x 10 days.  Pt was told if he developed a rash to stop the medication and let me know.  We will watch for the urine culture as well.

## 2018-07-02 NOTE — Unmapped (Signed)
Tac level reviewed with Dr. Farrel Gobble, will increase dose to 3mg  in the am and 2mg  in the pm.

## 2018-07-03 ENCOUNTER — Encounter: Admit: 2018-07-03 | Discharge: 2018-07-04 | Payer: BLUE CROSS/BLUE SHIELD

## 2018-07-03 DIAGNOSIS — Z940 Kidney transplant status: Principal | ICD-10-CM

## 2018-07-03 DIAGNOSIS — Z79899 Other long term (current) drug therapy: Secondary | ICD-10-CM

## 2018-07-03 LAB — CBC W/ AUTO DIFF
BASOPHILS RELATIVE PERCENT: 0 %
EOSINOPHILS ABSOLUTE COUNT: 0 10*9/L (ref 0.0–0.4)
EOSINOPHILS RELATIVE PERCENT: 0.5 %
HEMATOCRIT: 29.4 % — ABNORMAL LOW (ref 41.0–53.0)
LARGE UNSTAINED CELLS: 1 % (ref 0–4)
LYMPHOCYTES ABSOLUTE COUNT: 0 10*9/L — ABNORMAL LOW (ref 1.5–5.0)
LYMPHOCYTES RELATIVE PERCENT: 0.5 %
MEAN CORPUSCULAR HEMOGLOBIN CONC: 32.9 g/dL (ref 31.0–37.0)
MEAN CORPUSCULAR HEMOGLOBIN: 26.2 pg (ref 26.0–34.0)
MEAN CORPUSCULAR VOLUME: 79.8 fL — ABNORMAL LOW (ref 80.0–100.0)
MEAN PLATELET VOLUME: 7.9 fL (ref 7.0–10.0)
MONOCYTES ABSOLUTE COUNT: 0.2 10*9/L (ref 0.2–0.8)
MONOCYTES RELATIVE PERCENT: 2.9 %
NEUTROPHILS ABSOLUTE COUNT: 6.4 10*9/L (ref 2.0–7.5)
NEUTROPHILS RELATIVE PERCENT: 95.6 %
PLATELET COUNT: 195 10*9/L (ref 150–440)
RED CELL DISTRIBUTION WIDTH: 15.3 % — ABNORMAL HIGH (ref 12.0–15.0)
WBC ADJUSTED: 6.6 10*9/L (ref 4.5–11.0)

## 2018-07-03 LAB — URINALYSIS
BILIRUBIN UA: NEGATIVE
GLUCOSE UA: NEGATIVE
GRANULAR CASTS: 1 /LPF — ABNORMAL HIGH
KETONES UA: NEGATIVE
NITRITE UA: NEGATIVE
PH UA: 6 (ref 5.0–9.0)
PROTEIN UA: 30 — AB
RBC CASTS: 3 /LPF
SPECIFIC GRAVITY UA: 1.02 (ref 1.005–1.040)
UROBILINOGEN UA: 0.2
WAXY CASTS: <1 /LPF — ABNORMAL HIGH
WBC UA: 6 /HPF — ABNORMAL HIGH (ref <2)

## 2018-07-03 LAB — RENAL FUNCTION PANEL
ALBUMIN: 3.9 g/dL (ref 3.5–5.0)
ANION GAP: 11 mmol/L (ref 7–15)
BLOOD UREA NITROGEN: 52 mg/dL — ABNORMAL HIGH (ref 7–21)
BUN / CREAT RATIO: 21
CHLORIDE: 106 mmol/L (ref 98–107)
CREATININE: 2.52 mg/dL — ABNORMAL HIGH (ref 0.70–1.30)
EGFR CKD-EPI AA MALE: 35 mL/min/{1.73_m2} — ABNORMAL LOW (ref >=60)
EGFR CKD-EPI NON-AA MALE: 30 mL/min/{1.73_m2} — ABNORMAL LOW (ref >=60)
GLUCOSE RANDOM: 104 mg/dL (ref 70–179)
PHOSPHORUS: 3.7 mg/dL (ref 2.9–4.7)
POTASSIUM: 4 mmol/L (ref 3.5–5.0)
SODIUM: 140 mmol/L (ref 135–145)

## 2018-07-03 LAB — PHOSPHORUS: Phosphate:MCnc:Pt:Ser/Plas:Qn:: 3.7

## 2018-07-03 LAB — BILIRUBIN UA: Lab: NEGATIVE

## 2018-07-03 LAB — MAGNESIUM: Magnesium:MCnc:Pt:Ser/Plas:Qn:: 2.3 — ABNORMAL HIGH

## 2018-07-03 LAB — TACROLIMUS LEVEL, TROUGH: TACROLIMUS, TROUGH: 3.5 ng/mL — ABNORMAL LOW (ref 5.0–15.0)

## 2018-07-03 LAB — EOSINOPHILS ABSOLUTE COUNT: Lab: 0

## 2018-07-03 LAB — PARATHYROID HOMONE (PTH): CALCIUM: 8.8 mg/dL (ref 8.5–10.2)

## 2018-07-03 LAB — TACROLIMUS, TROUGH: Lab: 3.5 — ABNORMAL LOW

## 2018-07-03 LAB — PARATHYROID HORMONE INTACT: Parathyrin.intact:MCnc:Pt:Ser/Plas:Qn:: 464.1 — ABNORMAL HIGH

## 2018-07-03 LAB — PROTEIN/CREAT RATIO, URINE: Protein/Creatinine:MRto:Pt:Urine:Qn:: 0.344

## 2018-07-03 MED ORDER — TACROLIMUS 1 MG CAPSULE
ORAL_CAPSULE | Freq: Two times a day (BID) | ORAL | 11 refills | 0.00000 days
Start: 2018-07-03 — End: 2018-07-08

## 2018-07-03 NOTE — Unmapped (Signed)
Per Vernona Rieger PharmD increase Prograf to 4mg  BID    Called and went over labs with patient and new Prograf dose.  Patient verbalized that he will increase his Prograf to 4mg  BID, starting with tonight's dose.  He will have labs again Friday. States he just ordered a refill on his Prograf and I let him know that I would send in an updated prescription    Reports he is feeling good otherwise.

## 2018-07-04 LAB — VITAMIN D, TOTAL (25OH): Lab: 24

## 2018-07-04 NOTE — Unmapped (Signed)
6/10 update: patient verified that insurance requires him to use [facility] for prograf- he has a shipment coming out from them today - he will not be getting prograf from [facility]    [facility]   Patient Onboarding/Medication Counseling    Ethan West is a 43 y.o. male with kidney transplant who I am counseling today on continuation of therapy.  I am speaking to the patient.    Verified patient's date of birth / HIPAA.    Specialty medication(s) to be sent: none      Non-specialty medications/supplies to be sent: none      Medications not needed at this time: n/a           Prograf (tacrolimus)    Medication & Administration     Dosage: take 4 capsules (4mg ) by mouth twice daily     Administration:   ? May take with or without food  ? Take 12 hours apart    Adherence/Missed dose instructions:  ? Take a missed dose as soon as you think about it.  ? If it is close to the time for your next dose, skip the missed dose and go back to your normal time.  ? Do not take 2 doses at the same time or extra doses.    Goals of Therapy     ? To prevent organ rejection    Side Effects & Monitoring Parameters     ? Common side effects  ? Dizziness  ? Fatigue  ? Headache  ? Stuffy nose or sore throat  ? Nausea, vomiting, stomach pain, diarrhea, constipation  ? Heartburn  ? Back or joint pain  ? Increased risk of infection    ? The following side effects should be reported to the provider:  ? Allergic reaction  ? Kidney issues (change in quantity or urine passed, blood in urine, or weight gain)  ? High blood pressure (dizziness, change in eyesight, headache)  ? Electrolyte issues (change in mood, confusion, muscle pain, or weakness)  ? Abnormal breathing  ? Shakiness  ? Unexplained bleeding or bruising (gums bleeding, blood in urine, nosebleeds, any abnormal bleeding)  ? Signs of infection  ? Skin changes (sores, paleness, new or changed bumps or moles)    ? Monitoring Parameters  ? Renal function  ? Liver function  ? Glucose levels  ? Blood pressure  ? Tacrolimus trough levels  ? Cardiac monitoring (for QT prolongation)      Contraindications, Warnings, & Precautions     ? Black Box Warning: Infections - immunosuppressant agents increase the risk of infection that may lead to hospitalization or death  ? Black Box Warning: Malignancy - immunosuppressant agents may be associated with the development of malignancies that may lead to hospitalization or death  ? Limit or avoid sun and ultraviolet light exposure, use appropriate sun protection  ? Myocardial hypertrophy -avoid use in patients with congenital long QT syndrome  ? Diabetes mellitus - the risk for new-onset diabetes and insulin-dependent post-transplant diabetes mellitus is increased with tacrolimus use after transplantation  ? GI perforation  ? Hyperkalemia  ? Hypertension  ? Nephrotoxicity  ? Neurotoxicity  ? This is a narrow therapeutic index drug. Do not switch manufacturers without first talking to the provider.    Drug/Food Interactions     ? Medication list reviewed in Epic. The patient was instructed to inform the care team before taking any new medications or supplements. no interactions noted that clinic is not  already monitoring.   ? Avoid alcohol  ? Avoid grapefruit or grapefruit juice  ? Avoid live vaccines    Storage, Handling Precautions, & Disposal     ? Store at room temperature  ? Keep away from children and pets      Current Medications (including OTC/herbals), Comorbidities and Allergies     Current Outpatient Medications   Medication Sig Dispense Refill   ??? acetaminophen (TYLENOL) 500 MG tablet Take 2 tablets (1,000 mg total) by mouth every six (6) hours as needed for pain. 100 tablet 0   ??? amLODIPine (NORVASC) 10 MG tablet Take 0.5 tablets (5 mg total) by mouth daily. 15 tablet 11   ??? aspirin (ECOTRIN) 81 MG tablet Take 1 tablet (81 mg total) by mouth daily. 30 tablet 11   ??? carvedilol (COREG) 6.25 MG tablet Take 1 tablet (6.25 mg total) by mouth Two (2) times a day. 180 tablet 3   ??? cefpodoxime (VANTIN) 200 MG tablet Take 1 tablet (200 mg total) by mouth every twelve (12) hours for 10 days. 20 tablet 0   ??? docusate sodium (COLACE) 100 MG capsule Take 1 capsule (100 mg total) by mouth two (2) times a day as needed for constipation. 60 capsule 0   ??? escitalopram oxalate (LEXAPRO) 10 MG tablet Take 1 tablet (10 mg total) by mouth daily. (Patient taking differently: Take 10 mg by mouth every morning. ) 90 tablet 3   ??? gabapentin (NEURONTIN) 100 MG capsule Take 1 capsule (100 mg total) by mouth nightly for 11 days. 11 capsule 0   ??? magnesium oxide-Mg AA chelate (MAGNESIUM, AMINO ACID CHELATE,) 133 mg Tab Take 1 tablet by mouth Two (2) times a day. HOLD until directed to start by your coordinator. 60 tablet 11   ??? mycophenolate (MYFORTIC) 180 MG EC tablet Take 3 tablets (540 mg total) by mouth Two (2) times a day. 540 tablet 3   ??? polyethylene glycol (MIRALAX) 17 gram packet Take 17 g (1 packet mixed in 4-8 oz of liquid)  by mouth daily as needed. 30 each 0   ??? sodium bicarbonate 650 mg tablet Take 1 tablet (650 mg total) by mouth Three (3) times a day. 90 tablet 11   ??? sulfamethoxazole-trimethoprim (BACTRIM) 400-80 mg per tablet Take 1 tablet (80 mg of trimethoprim total) by mouth Every Monday, Wednesday, and Friday. 12 tablet 5   ??? tacrolimus (PROGRAF) 1 MG capsule Take 4 capsules (4 mg total) by mouth two (2) times a day. z94.0 240 capsule 11   ??? traMADoL (ULTRAM) 50 mg tablet Take 1-2 tablets (50-100 mg total) by mouth every twelve (12) hours as needed. 30 tablet 0   ??? valGANciclovir (VALCYTE) 450 mg tablet Take 1 tablet (450 mg total) by mouth daily. 30 tablet 2     No current facility-administered medications for this visit.        Allergies   Allergen Reactions   ??? Benazepril Rash   ??? Clarithromycin Rash   ??? Penicillins Rash       Patient Active Problem List   Diagnosis   ??? Autosomal dominant polycystic kidney disease   ??? Urinary frequency   ??? Kidney stone   ??? Urinary tract infection, site not specified   ??? Chronic kidney disease (CKD), stage V (CMS-HCC)   ??? Bruising   ??? Inguinal hernia, right   ??? Essential hypertension   ??? ADPKD (autosomal dominant polycystic kidney disease)   ??? Metabolic acidosis   ???  Secondary hyperparathyroidism (CMS-HCC)   ??? Kidney transplant status, living related donor   ??? Anemia in CKD (chronic kidney disease)   ??? 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 Olecranon bursitis of right elbow       Reviewed and up to date in Epic.    Appropriateness of Therapy     Is medication and dose appropriate based on diagnosis? Yes    Baseline Quality of Life Assessment      How many days over the past month did your transplant keep you from your normal activities? new transplant so no days normal but patient is doing well he reports    Financial Information     Medication Assistance provided: None Required    Anticipated copay of $pending reviewed with patient. Verified delivery address.    Delivery Information     Scheduled delivery date: n/a    Expected start date: patient is currently already taking    Medication will be delivered via n/a to the n/a address in [place].  This shipment will not require a signature.      Explained the services we provide at [facility] and that each month we would call to set up refills.  Stressed importance of returning phone calls so that we could ensure they receive their medications in time each month.  Informed patient that we should be setting up refills 7-10 days prior to when they will run out of medication.  A pharmacist will reach out to perform a clinical assessment periodically.  Informed patient that a welcome packet and a drug information handout will be sent.      Patient verbalized understanding of the above information as well as how to contact the pharmacy at [phone] option 4 with any questions/concerns.  The pharmacy is open Monday through Friday 8:30am-4:30pm.  A pharmacist is available 24/7 via pager to answer any clinical questions they may have.    Patient Specific Needs     ? Does the patient have any physical, cognitive, or cultural barriers? No    ? Patient prefers to have medications discussed with  Patient     ? Is the patient able to read and understand education materials at a high school level or above? Yes    ? Patient's primary language is  English     ? Is the patient high risk? Yes, patient taking a REMS drug     ? Does the patient require a Care Management Plan? No     ? Does the patient require physician intervention or other additional services (i.e. nutrition, smoking cessation, social work)? No      Thad Ranger  [facility] Specialty Pharmacist

## 2018-07-04 NOTE — Unmapped (Signed)
6/10 update: patient verified that myfortic is required to be filled at [facility] per insurance. He has already talked to them and will not get [facility]fortic from [facility]    [facility]   Patient Onboarding/Medication Counseling    Mr.Ethan West is a 43 y.o. male with kidney transplant who I am counseling today on continuation of therapy.  I am speaking to the patient.    Verified patient's date of birth / HIPAA.    Specialty medication(s) to be sent: none      Non-specialty medications/supplies to be sent: none      Medications not needed at this time: none         Myfortic (mycophenolic acid)    Medication & Administration     Dosage:   ? Take 3 tablets (540mg  total) by mouth twice daily    Administration:   ? Take with or without food, although taking with food helps minimize GI side effects.  ? Swallow the pills whole, do not chew or crush    Adherence/Missed dose instructions:  ? Take a missed dose as soon as you think about it.  ? If it is less than 2 hours until your next dose, skip the missed dose and go back to your normal time.  ? Do not take 2 doses at the same time or extra doses.    Goals of Therapy     ? To prevent organ rejection    Side Effects & Monitoring Parameters     ? Common side effects  ? Back or joint pain  ? Constipation  ? Headache/dizziness  ? Not hungry  ? Stomach pain, diarrhea, constipation, gas, upset stomach, vomiting, nausea  ? Feeling tired or weak  ? Shakiness  ? Trouble sleeping  ? Increased risk of infection    ? The following side effects should be reported to the provider:  ? Allergic reaction  ? High blood sugar (confusion, feeling sleepy, more thirst, more hungry, passing urine more often, flushing, fast breathing, or breath that smells like fruit)  ? Electrolyte issues (mood changes, confusion, muscle pain or weakness, a heartbeat that does not feel normal, seizures, not hungry, or very bad upset stomach or throwing up)  ? High or low blood pressure (bad headache or dizziness, passing out, or change in eyesight)  ? Kidney issues (unable to pass urine, change in how much urine is passed, blood in the urine, or a big weight gain)  ? Skin (oozing, heat, swelling, redness, or pain), UTI and other infections   ? Chest pain or pressure  ? Abnormal heartbeat  ? Unexplained bleeding or bruising  ? Abnormal burning, numbness, or tingling  ? Muscle cramps,  ? Yellowing of skin or eyes    ? Monitoring parameters  ? Pregnancy test initially prior to treatment and 8-10 days later then as needed)  ? CBC weekly for first month then twice monthly for next 2 months, then monthly)  ? Monitor Renal and liver functions  ? Signs of organ rejection    Contraindications, Warnings, & Precautions     ? *This is a REMS drug and an FDA-approved patient medication guide will be printed with each dispensation  ? Black Box Warning: Infections   ? Black Box Warning: Lymphoproliferative disorders - risk of development of lymphoma and skin malignancy is increased  ? Black Box Warning: Use during pregnancy is associated with increased risks of first trimester pregnancy loss and congenital malformations.   ?  Black Box Warning: Females of reproductive potential should use contraception during treatment and for 6 weeks after therapy is discontinued  ? CNS depression  ? New or reactivated viral infections  ? Neutropenia  ? Male patients and/or their male partners should use effective contraception during treatment of the male patient and for at least 3 months after last dose.  ? Breastfeeding is not recommended during therapy and for 6 weeks after last dose    Drug/Food Interactions     ? Medication list reviewed in Epic. The patient was instructed to inform the care team before taking any new medications or supplements. no interactions noted that clinic is not already monitoring.   ? Do not take Echinacea while on this medication  ? Check with your doctor before getting any vaccinations (live or inactivated)    Storage, Handling Precautions, & Disposal     ? Store at room temperature  ? Keep away from children and pets  ? This drug is considered hazardous and should be handled as little as possible.  Wash hands before and after touching pills. If someone else helps with medication administration, they should wear gloves.          Current Medications (including OTC/herbals), Comorbidities and Allergies     Current Outpatient Medications   Medication Sig Dispense Refill   ??? acetaminophen (TYLENOL) 500 MG tablet Take 2 tablets (1,000 mg total) by mouth every six (6) hours as needed for pain. 100 tablet 0   ??? amLODIPine (NORVASC) 10 MG tablet Take 0.5 tablets (5 mg total) by mouth daily. 15 tablet 11   ??? aspirin (ECOTRIN) 81 MG tablet Take 1 tablet (81 mg total) by mouth daily. 30 tablet 11   ??? carvedilol (COREG) 6.25 MG tablet Take 1 tablet (6.25 mg total) by mouth Two (2) times a day. 180 tablet 3   ??? cefpodoxime (VANTIN) 200 MG tablet Take 1 tablet (200 mg total) by mouth every twelve (12) hours for 10 days. 20 tablet 0   ??? docusate sodium (COLACE) 100 MG capsule Take 1 capsule (100 mg total) by mouth two (2) times a day as needed for constipation. 60 capsule 0   ??? escitalopram oxalate (LEXAPRO) 10 MG tablet Take 1 tablet (10 mg total) by mouth daily. (Patient taking differently: Take 10 mg by mouth every morning. ) 90 tablet 3   ??? gabapentin (NEURONTIN) 100 MG capsule Take 1 capsule (100 mg total) by mouth nightly for 11 days. 11 capsule 0   ??? magnesium oxide-Mg AA chelate (MAGNESIUM, AMINO ACID CHELATE,) 133 mg Tab Take 1 tablet by mouth Two (2) times a day. HOLD until directed to start by your coordinator. 60 tablet 11   ??? mycophenolate (MYFORTIC) 180 MG EC tablet Take 3 tablets (540 mg total) by mouth Two (2) times a day. 540 tablet 3   ??? polyethylene glycol (MIRALAX) 17 gram packet Take 17 g (1 packet mixed in 4-8 oz of liquid)  by mouth daily as needed. 30 each 0   ??? sodium bicarbonate 650 mg tablet Take 1 tablet (650 mg total) by mouth Three (3) times a day. 90 tablet 11   ??? sulfamethoxazole-trimethoprim (BACTRIM) 400-80 mg per tablet Take 1 tablet (80 mg of trimethoprim total) by mouth Every Monday, Wednesday, and Friday. 12 tablet 5   ??? tacrolimus (PROGRAF) 1 MG capsule Take 4 capsules (4 mg total) by mouth two (2) times a day. z94.0 240 capsule 11   ??? traMADoL (ULTRAM) 50  mg tablet Take 1-2 tablets (50-100 mg total) by mouth every twelve (12) hours as needed. 30 tablet 0   ??? valGANciclovir (VALCYTE) 450 mg tablet Take 1 tablet (450 mg total) by mouth daily. 30 tablet 2     No current facility-administered medications for this visit.        Allergies   Allergen Reactions   ??? Benazepril Rash   ??? Clarithromycin Rash   ??? Penicillins Rash       Patient Active Problem List   Diagnosis   ??? Autosomal dominant polycystic kidney disease   ??? Urinary frequency   ??? Kidney stone   ??? Urinary tract infection, site not specified   ??? Chronic kidney disease (CKD), stage V (CMS-HCC)   ??? Bruising   ??? Inguinal hernia, right   ??? Essential hypertension   ??? ADPKD (autosomal dominant polycystic kidney disease)   ??? Metabolic acidosis   ??? Secondary hyperparathyroidism (CMS-HCC)   ??? Kidney transplant status, living related donor   ??? Anemia in CKD (chronic kidney disease)   ??? 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 Olecranon bursitis of right elbow       Reviewed and up to date in Epic.    Appropriateness of Therapy     Is medication and dose appropriate based on diagnosis? Yes    Baseline Quality of Life Assessment      How many days over the past month did your transplant keep you from your normal activities? new transplant so no days are normal however patient states he is doing well    Financial Information     Medication Assistance provided: None Required    Anticipated copay of $pending reviewed with patient. Verified delivery address.    Delivery Information     Scheduled delivery date: n/a    Expected start date: patient is currently already taking    Medication will be delivered via n/a to the na address in [place].  This shipment will not require a signature.      Explained the services we provide at [facility] and that each month we would call to set up refills.  Stressed importance of returning phone calls so that we could ensure they receive their medications in time each month.  Informed patient that we should be setting up refills 7-10 days prior to when they will run out of medication.  A pharmacist will reach out to perform a clinical assessment periodically.  Informed patient that a welcome packet and a drug information handout will be sent.      Patient verbalized understanding of the above information as well as how to contact the pharmacy at [phone] option 4 with any questions/concerns.  The pharmacy is open Monday through Friday 8:30am-4:30pm.  A pharmacist is available 24/7 via pager to answer any clinical questions they may have.    Patient Specific Needs     ? Does the patient have any physical, cognitive, or cultural barriers? No    ? Patient prefers to have medications discussed with  Patient     ? Is the patient able to read and understand education materials at a high school level or above? Yes    ? Patient's primary language is  English     ? Is the patient high risk? Yes, patient taking a REMS drug     ? Does the patient require a Care Management Plan? No     ?  Does the patient require physician intervention or other additional services (i.e. nutrition, smoking cessation, social work)? No      Thad Ranger  [facility] Specialty Pharmacist

## 2018-07-04 NOTE — Unmapped (Signed)
[facility]   Patient Onboarding/Medication Counseling    Ethan West is a 43 y.o. male with kidney transplant who I am counseling today on continuation of therapy.  I am speaking to the patient.    Verified patient's date of birth / HIPAA.    Specialty medication(s) to be sent: none      Non-specialty medications/supplies to be sent: none      Medications not needed at this time: none needed at this time       Valcyte (valganciclovir)    Medication & Administration     Dosage:   ? Take 1 tablet (450mg  total) by mouth once daily    Administration:   ? Take with food  ? Swallow the pills whole, do not break, crush, or chew    Adherence/Missed dose instructions:  ? Take a missed dose as soon as you think about it with food  ? If it is close to your next dose, skip the missed dose and go back to your normal time.  ? Do not take 2 doses at the same time or extra doses.  ? Report any missed doses to coordinator    Goals of Therapy     ? To prevent or treat CMV infection in setting of solid organ transplant    Side Effects & Monitoring Parameters     ? Common side effects  ? Headache  ? Diarrhea or constipation  ? Appetite or sleep disturbances  ? Back, muscle, joint, or belly pain  ? Weight loss  ? Dizziness  ? Muscle spasm  ? Upset stomach or vomiting    ? The following side effects should be reported to the provider:  ? Allergic reaction  (rash, hives, swelling, blistered or peeling skin, shortness of breath)  ? Infection (fever, chills, sore throat, ear/sinus pain, cough, sputum change, urinary pain, mouth sores, non-healing wounds)  ? Bleeding (cough ground vomit, blood in urine, black/red/tarry stools, unexplained bruising or bleeding)  ? Electrolyte problems (mood changes, confusion, weakness, abnormal heartbeat, seizures)  ? Kidney problems (urine changes, weight gain)  ? Yellowing skin or eyes  ? Swelling in arms, legs, stomach  ? Severe dizziness or passing out  ? Eye issues (eyesight changes, pain, or irritation)  ? Night sweats    ? Monitoring parameters  ? Have eye exam as directed by doctor  ? CMV counts  ? CBC  ? Renal function  ? Pregnancy test prior to initiation    Contraindications, Warnings, & Precautions     ? BBW: severe leukopenia, neutropenia, anemia, thrombocytopenia, pancytopenia, and bone marrow failure, including aplastic anemia have been reported  ? BBW: may cause temporary or permanent inhibition of spermatogenesis and suppression of fertilty; has the potential to cause birth defects and cancers in humans  ? Male patients should have pregnancy test prior to initiation and use birth control for at least 30 days after discontinuation  ? Male patients should use a barrier contraceptive while on therapy and for 90 days after discontinuation  ? Acute renal failure  ? Not indicated for use in liver transplant recipients  ? Breastfeeding is not recommended    Drug/Food Interactions     ? Medication list reviewed in Epic. The patient was instructed to inform the care team before taking any new medications or supplements. no interactions noted that clinic is not already monitoring.   ? Check with your doctor before getting any vaccinations (live or inactivated)    Storage,  Handling Precautions, & Disposal     ? Store at room temperature  ? Keep away from children and pets      Current Medications (including OTC/herbals), Comorbidities and Allergies     Current Outpatient Medications   Medication Sig Dispense Refill   ??? acetaminophen (TYLENOL) 500 MG tablet Take 2 tablets (1,000 mg total) by mouth every six (6) hours as needed for pain. 100 tablet 0   ??? amLODIPine (NORVASC) 10 MG tablet Take 0.5 tablets (5 mg total) by mouth daily. 15 tablet 11   ??? aspirin (ECOTRIN) 81 MG tablet Take 1 tablet (81 mg total) by mouth daily. 30 tablet 11   ??? carvedilol (COREG) 6.25 MG tablet Take 1 tablet (6.25 mg total) by mouth Two (2) times a day. 180 tablet 3   ??? cefpodoxime (VANTIN) 200 MG tablet Take 1 tablet (200 mg total) by mouth every twelve (12) hours for 10 days. 20 tablet 0   ??? docusate sodium (COLACE) 100 MG capsule Take 1 capsule (100 mg total) by mouth two (2) times a day as needed for constipation. 60 capsule 0   ??? escitalopram oxalate (LEXAPRO) 10 MG tablet Take 1 tablet (10 mg total) by mouth daily. (Patient taking differently: Take 10 mg by mouth every morning. ) 90 tablet 3   ??? gabapentin (NEURONTIN) 100 MG capsule Take 1 capsule (100 mg total) by mouth nightly for 11 days. 11 capsule 0   ??? magnesium oxide-Mg AA chelate (MAGNESIUM, AMINO ACID CHELATE,) 133 mg Tab Take 1 tablet by mouth Two (2) times a day. HOLD until directed to start by your coordinator. 60 tablet 11   ??? mycophenolate (MYFORTIC) 180 MG EC tablet Take 3 tablets (540 mg total) by mouth Two (2) times a day. 540 tablet 3   ??? polyethylene glycol (MIRALAX) 17 gram packet Take 17 g (1 packet mixed in 4-8 oz of liquid)  by mouth daily as needed. 30 each 0   ??? sodium bicarbonate 650 mg tablet Take 1 tablet (650 mg total) by mouth Three (3) times a day. 90 tablet 11   ??? sulfamethoxazole-trimethoprim (BACTRIM) 400-80 mg per tablet Take 1 tablet (80 mg of trimethoprim total) by mouth Every Monday, Wednesday, and Friday. 12 tablet 5   ??? tacrolimus (PROGRAF) 1 MG capsule Take 4 capsules (4 mg total) by mouth two (2) times a day. z94.0 240 capsule 11   ??? traMADoL (ULTRAM) 50 mg tablet Take 1-2 tablets (50-100 mg total) by mouth every twelve (12) hours as needed. 30 tablet 0   ??? valGANciclovir (VALCYTE) 450 mg tablet Take 1 tablet (450 mg total) by mouth daily. 30 tablet 2     No current facility-administered medications for this visit.        Allergies   Allergen Reactions   ??? Benazepril Rash   ??? Clarithromycin Rash   ??? Penicillins Rash       Patient Active Problem List   Diagnosis   ??? Autosomal dominant polycystic kidney disease   ??? Urinary frequency   ??? Kidney stone   ??? Urinary tract infection, site not specified   ??? Chronic kidney disease (CKD), stage V (CMS-HCC)   ??? Bruising   ??? Inguinal hernia, right   ??? Essential hypertension   ??? ADPKD (autosomal dominant polycystic kidney disease)   ??? Metabolic acidosis   ??? Secondary hyperparathyroidism (CMS-HCC)   ??? Kidney transplant status, living related donor   ??? Anemia in CKD (chronic kidney disease)   ???  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 Olecranon bursitis of right elbow       Reviewed and up to date in Epic.    Appropriateness of Therapy     Is medication and dose appropriate based on diagnosis? Yes    Baseline Quality of Life Assessment      How many days over the past month did your transplant keep you from your normal activities? new transplant so no days are normal but patient states he is doing well    Financial Information     Medication Assistance provided: None Required    Anticipated copay of $0 reviewed with patient. Verified delivery address.    Delivery Information     Scheduled delivery date: none at this time - wants a call back in 2 weeks  Expected start date: patient is currently taking    Medication will be delivered via n/a to the n/a address in Epic [place].  This shipment will not require a signature.      Explained the services we provide at [facility] and that each month we would call to set up refills.  Stressed importance of returning phone calls so that we could ensure they receive their medications in time each month.  Informed patient that we should be setting up refills 7-10 days prior to when they will run out of medication.  A pharmacist will reach out to perform a clinical assessment periodically.  Informed patient that a welcome packet and a drug information handout will be sent.      Patient verbalized understanding of the above information as well as how to contact the pharmacy at [phone] option 4 with any questions/concerns.  The pharmacy is open Monday through Friday 8:30am-4:30pm.  A pharmacist is available 24/7 via pager to answer any clinical questions they may have.    Patient Specific Needs     ? Does the patient have any physical, cognitive, or cultural barriers? No    ? Patient prefers to have medications discussed with  Patient     ? Is the patient able to read and understand education materials at a high school level or above? Yes    ? Patient's primary language is  English     ? Is the patient high risk? Yes, patient taking a REMS drug     ? Does the patient require a Care Management Plan? No     ? Does the patient require physician intervention or other additional services (i.e. nutrition, smoking cessation, social work)? No      Thad Ranger  [facility] Specialty Pharmacist

## 2018-07-05 ENCOUNTER — Encounter: Admit: 2018-07-05 | Discharge: 2018-07-06 | Payer: PRIVATE HEALTH INSURANCE

## 2018-07-05 DIAGNOSIS — Z79899 Other long term (current) drug therapy: Secondary | ICD-10-CM

## 2018-07-05 DIAGNOSIS — Z940 Kidney transplant status: Principal | ICD-10-CM

## 2018-07-05 LAB — CBC W/ AUTO DIFF
BASOPHILS ABSOLUTE COUNT: 0 10*9/L (ref 0.0–0.1)
BASOPHILS RELATIVE PERCENT: 0.2 %
EOSINOPHILS RELATIVE PERCENT: 0.8 %
HEMATOCRIT: 29.6 % — ABNORMAL LOW (ref 41.0–53.0)
HEMOGLOBIN: 9.2 g/dL — ABNORMAL LOW (ref 13.5–17.5)
LARGE UNSTAINED CELLS: 0 % (ref 0–4)
LYMPHOCYTES ABSOLUTE COUNT: 0 10*9/L — ABNORMAL LOW (ref 1.5–5.0)
LYMPHOCYTES RELATIVE PERCENT: 0.4 %
MEAN CORPUSCULAR HEMOGLOBIN CONC: 30.9 g/dL — ABNORMAL LOW (ref 31.0–37.0)
MEAN CORPUSCULAR HEMOGLOBIN: 25.8 pg — ABNORMAL LOW (ref 26.0–34.0)
MEAN CORPUSCULAR VOLUME: 83.3 fL (ref 80.0–100.0)
MEAN PLATELET VOLUME: 10 fL (ref 7.0–10.0)
MONOCYTES ABSOLUTE COUNT: 0.1 10*9/L — ABNORMAL LOW (ref 0.2–0.8)
MONOCYTES RELATIVE PERCENT: 3 %
NEUTROPHILS ABSOLUTE COUNT: 4.6 10*9/L (ref 2.0–7.5)
NEUTROPHILS RELATIVE PERCENT: 95.4 %
PLATELET COUNT: 215 10*9/L (ref 150–440)
RED BLOOD CELL COUNT: 3.56 10*12/L — ABNORMAL LOW (ref 4.50–5.90)
RED CELL DISTRIBUTION WIDTH: 15.4 % — ABNORMAL HIGH (ref 12.0–15.0)

## 2018-07-05 LAB — CO2: Carbon dioxide:SCnc:Pt:Ser/Plas:Qn:: 19 — ABNORMAL LOW

## 2018-07-05 LAB — MEAN PLATELET VOLUME: Lab: 10

## 2018-07-05 LAB — RENAL FUNCTION PANEL
ALBUMIN: 3.5 g/dL (ref 3.5–5.0)
ANION GAP: 12 mmol/L (ref 7–15)
BLOOD UREA NITROGEN: 41 mg/dL — ABNORMAL HIGH (ref 7–21)
CALCIUM: 9.1 mg/dL (ref 8.5–10.2)
CHLORIDE: 108 mmol/L — ABNORMAL HIGH (ref 98–107)
CO2: 19 mmol/L — ABNORMAL LOW (ref 22.0–30.0)
CREATININE: 2.5 mg/dL — ABNORMAL HIGH (ref 0.70–1.30)
EGFR CKD-EPI NON-AA MALE: 31 mL/min/{1.73_m2} — ABNORMAL LOW (ref >=60)
GLUCOSE RANDOM: 98 mg/dL (ref 70–179)
PHOSPHORUS: 3.1 mg/dL (ref 2.9–4.7)
POTASSIUM: 4.1 mmol/L (ref 3.5–5.0)
SODIUM: 139 mmol/L (ref 135–145)

## 2018-07-05 LAB — MAGNESIUM
MAGNESIUM: 2.1 mg/dL (ref 1.6–2.2)
Magnesium:MCnc:Pt:Ser/Plas:Qn:: 2.1

## 2018-07-05 LAB — SMEAR REVIEW

## 2018-07-05 LAB — CMV DNA, QUANTITATIVE, PCR

## 2018-07-05 LAB — CMV QUANT: Lab: 0

## 2018-07-05 MED ORDER — AMLODIPINE 10 MG TABLET
ORAL_TABLET | Freq: Every day | ORAL | 11 refills | 30.00000 days | Status: CP
Start: 2018-07-05 — End: 2019-07-05
  Filled 2018-08-26: qty 30, 30d supply, fill #0

## 2018-07-05 NOTE — Unmapped (Signed)
Labs drawn per transplant

## 2018-07-05 NOTE — Unmapped (Signed)
Pt called on-call coordinator regarding drains. Pt states drain output has decreased from 5-55ml down to 0-67ml this morning. Pt has noted leaking from insertion site. Pt has only had to change dressing once daily and dressing not saturated. Pt states he has noticed tenderness around insertion site to one drain, denies redness, swelling or smelly drainage and denies fevers. TNC advised pt to continue to monitor for s/s of infection and output. Pt to call if starts to notice redness at insertion site, increased/change in drainage, swelling of abdomen. Pt also has noticed an increase in BP over past day, BP this morning 155/106, BP yesterday 161/103 (am) and 148/106 (pm). Reviewed with Cleone Slim CPP, increasing amlodipine to 10mg  daily. Pt made aware, prescription updated.

## 2018-07-06 LAB — TACROLIMUS BLOOD: Lab: 8.9

## 2018-07-06 LAB — VITAMIN D 1,25 DIHYDROXY: VITAMIN D 1,25-DIHYDROXY: 14 pg/mL — ABNORMAL LOW

## 2018-07-06 LAB — VITAMIN D 1,25-DIHYDROXY: 1,25-Dihydroxyvitamin D:MCnc:Pt:Ser/Plas:Qn:: 14 — ABNORMAL LOW

## 2018-07-08 ENCOUNTER — Encounter: Admit: 2018-07-08 | Discharge: 2018-07-08 | Payer: BLUE CROSS/BLUE SHIELD

## 2018-07-08 ENCOUNTER — Ambulatory Visit: Admit: 2018-07-08 | Discharge: 2018-07-08 | Payer: BLUE CROSS/BLUE SHIELD | Attending: Surgery | Primary: Surgery

## 2018-07-08 DIAGNOSIS — Z79899 Other long term (current) drug therapy: Secondary | ICD-10-CM

## 2018-07-08 DIAGNOSIS — Z6833 Body mass index (BMI) 33.0-33.9, adult: Secondary | ICD-10-CM

## 2018-07-08 DIAGNOSIS — Z940 Kidney transplant status: Principal | ICD-10-CM

## 2018-07-08 LAB — MAGNESIUM: Magnesium:MCnc:Pt:Ser/Plas:Qn:: 2.1

## 2018-07-08 LAB — CBC W/ AUTO DIFF
BASOPHILS ABSOLUTE COUNT: 0 10*9/L (ref 0.0–0.1)
BASOPHILS RELATIVE PERCENT: 0 %
EOSINOPHILS ABSOLUTE COUNT: 0.1 10*9/L (ref 0.0–0.4)
EOSINOPHILS RELATIVE PERCENT: 0.6 %
HEMATOCRIT: 31 % — ABNORMAL LOW (ref 41.0–53.0)
LARGE UNSTAINED CELLS: 0 % (ref 0–4)
LYMPHOCYTES ABSOLUTE COUNT: 0 10*9/L — ABNORMAL LOW (ref 1.5–5.0)
LYMPHOCYTES RELATIVE PERCENT: 0.5 %
MEAN CORPUSCULAR HEMOGLOBIN CONC: 32.1 g/dL (ref 31.0–37.0)
MEAN CORPUSCULAR HEMOGLOBIN: 25.9 pg — ABNORMAL LOW (ref 26.0–34.0)
MEAN CORPUSCULAR VOLUME: 80.6 fL (ref 80.0–100.0)
MEAN PLATELET VOLUME: 8.1 fL (ref 7.0–10.0)
MONOCYTES RELATIVE PERCENT: 3 %
NEUTROPHILS ABSOLUTE COUNT: 8.4 10*9/L — ABNORMAL HIGH (ref 2.0–7.5)
NEUTROPHILS RELATIVE PERCENT: 95.7 %
PLATELET COUNT: 249 10*9/L (ref 150–440)
RED BLOOD CELL COUNT: 3.84 10*12/L — ABNORMAL LOW (ref 4.50–5.90)
RED CELL DISTRIBUTION WIDTH: 15.8 % — ABNORMAL HIGH (ref 12.0–15.0)
WBC ADJUSTED: 8.7 10*9/L (ref 4.5–11.0)

## 2018-07-08 LAB — RENAL FUNCTION PANEL
ALBUMIN: 4 g/dL (ref 3.5–5.0)
ANION GAP: 13 mmol/L (ref 7–15)
BLOOD UREA NITROGEN: 42 mg/dL — ABNORMAL HIGH (ref 7–21)
BUN / CREAT RATIO: 15
CALCIUM: 9.6 mg/dL (ref 8.5–10.2)
CHLORIDE: 107 mmol/L (ref 98–107)
CO2: 20 mmol/L — ABNORMAL LOW (ref 22.0–30.0)
CREATININE: 2.82 mg/dL — ABNORMAL HIGH (ref 0.70–1.30)
EGFR CKD-EPI AA MALE: 31 mL/min/{1.73_m2} — ABNORMAL LOW (ref >=60)
EGFR CKD-EPI NON-AA MALE: 26 mL/min/{1.73_m2} — ABNORMAL LOW (ref >=60)
GLUCOSE RANDOM: 101 mg/dL (ref 70–179)
PHOSPHORUS: 3.7 mg/dL (ref 2.9–4.7)
POTASSIUM: 5 mmol/L (ref 3.5–5.0)
SODIUM: 140 mmol/L (ref 135–145)

## 2018-07-08 LAB — HYPOCHROMIA

## 2018-07-08 LAB — TACROLIMUS BLOOD: Lab: 11.9

## 2018-07-08 LAB — POTASSIUM: Potassium:SCnc:Pt:Ser/Plas:Qn:: 5

## 2018-07-08 MED ORDER — TACROLIMUS 1 MG CAPSULE
ORAL_CAPSULE | Freq: Two times a day (BID) | ORAL | 11 refills | 0 days
Start: 2018-07-08 — End: 2018-07-22

## 2018-07-08 MED ORDER — CHOLECALCIFEROL (VITAMIN D3) 50 MCG (2,000 UNIT) TABLET
Freq: Every day | ORAL | 0 days
Start: 2018-07-08 — End: 2018-07-16

## 2018-07-08 NOTE — Unmapped (Signed)
TRANSPLANT SURGERY CLINIC  ??  Assessment/Recommendations:  Ethan West is a 43 y.o. 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    - Can discontinue antibiotics after 7 days since no growth on urine culture  - Remove lateral (perinephric) drain today in clinic, continue subcutaneous (medial) drain   - Continue current blood pressure regimen  - Follow-up in 2 weeks in clinic, will likely d/c other drain at that time    HPI:  Ethan West is a 43 y.o. male s/p LDKT 6/2. His post-op course was uncomplicated and he was discharged on 6/5. His foley was removed while inpatient, he was discharged with 2 JP drains.     Since discharge, he did develop dysuria, u/a suggested UTI and he was started on 10-day course of cefpodoxime. He has some pain related to his drain sites that is controlled with Tramadol. He has some numbness inferior to his incision and on his upper thigh. He is eating and drinking without any nausea or vomiting. He is making between 1.5 - 2L urine per day. He denies dysuria or other urinary symptoms. He is having normal bowel function.     His drain output has significantly decreased. Drain 1 is putting out 20-49mL per day, drain 2 is putting out 5-73mL daily. It is serosanguinous.     His Amlodipine was increased to 10 last week but his blood pressures are still elevated, 140-150 systolic. He thinks this may be related to pain.      He denies any constitutional symptoms today, no chest pain, SOB.    Allergies  Benazepril; Clarithromycin; and Penicillins    Medications    Current Outpatient Medications   Medication Sig Dispense Refill   ??? acetaminophen (TYLENOL) 500 MG tablet Take 2 tablets (1,000 mg total) by mouth every six (6) hours as needed for pain. 100 tablet 0   ??? amLODIPine (NORVASC) 10 MG tablet Take 1 tablet (10 mg total) by mouth daily. 30 tablet 11   ??? aspirin (ECOTRIN) 81 MG tablet Take 1 tablet (81 mg total) by mouth daily. 30 tablet 11 ??? carvedilol (COREG) 6.25 MG tablet Take 1 tablet (6.25 mg total) by mouth Two (2) times a day. 180 tablet 3   ??? cefpodoxime (VANTIN) 200 MG tablet Take 1 tablet (200 mg total) by mouth every twelve (12) hours for 10 days. 20 tablet 0   ??? docusate sodium (COLACE) 100 MG capsule Take 1 capsule (100 mg total) by mouth two (2) times a day as needed for constipation. 60 capsule 0   ??? escitalopram oxalate (LEXAPRO) 10 MG tablet Take 1 tablet (10 mg total) by mouth daily. (Patient taking differently: Take 10 mg by mouth every morning. ) 90 tablet 3   ??? gabapentin (NEURONTIN) 100 MG capsule Take 1 capsule (100 mg total) by mouth nightly for 11 days. 11 capsule 0   ??? magnesium oxide-Mg AA chelate (MAGNESIUM, AMINO ACID CHELATE,) 133 mg Tab Take 1 tablet by mouth Two (2) times a day. HOLD until directed to start by your coordinator. 60 tablet 11   ??? mycophenolate (MYFORTIC) 180 MG EC tablet Take 3 tablets (540 mg total) by mouth Two (2) times a day. 540 tablet 3   ??? polyethylene glycol (MIRALAX) 17 gram packet Take 17 g (1 packet mixed in 4-8 oz of liquid)  by mouth daily as needed. 30 each 0   ??? sodium bicarbonate 650 mg tablet Take 1 tablet (650  mg total) by mouth Three (3) times a day. 90 tablet 11   ??? sulfamethoxazole-trimethoprim (BACTRIM) 400-80 mg per tablet Take 1 tablet (80 mg of trimethoprim total) by mouth Every Monday, Wednesday, and Friday. 12 tablet 5   ??? tacrolimus (PROGRAF) 1 MG capsule Take 4 capsules (4 mg total) by mouth two (2) times a day. z94.0 240 capsule 11   ??? traMADoL (ULTRAM) 50 mg tablet Take 1-2 tablets (50-100 mg total) by mouth every twelve (12) hours as needed. 30 tablet 0   ??? valGANciclovir (VALCYTE) 450 mg tablet Take 1 tablet (450 mg total) by mouth daily. 30 tablet 2     No current facility-administered medications for this visit.        Past Medical History  Past Medical History:   Diagnosis Date   ??? ADPKD (autosomal dominant polycystic kidney disease)    ??? Hypertension    ??? Kidney stone ??? Polycystic kidney disease        Past Surgical History  Past Surgical History:   Procedure Laterality Date   ??? APPENDECTOMY     ??? EXPLORATORY LAPAROTOMY     ??? PR AV ANAST,UP ARM BASILIC VEIN TRANSPOSIT Left 1/61/0960    Procedure: ARTERIOVENOUS ANASTOMOSIS, OPEN; BY UPPER ARM BASILIC VEIN TRANSPOSITION;  Surgeon: Leona Carry, MD;  Location: MAIN [facility];  Service: Transplant   ??? PR CREAT AV FISTULA,AUTOGENOUS GRAFT Left 12/05/2017    Procedure: CREATE AV FISTULA (SEPART PROC); AUTOG GFT, UPPER EXTREMITY;  Surgeon: Leona Carry, MD;  Location: MAIN [facility];  Service: Transplant   ??? PR TRANSPLANT,PREP CADAVER RENAL GRAFT N/A 06/25/2018    Procedure: [facility] STD PREP CAD DONR RENAL ALLOGFT PRIOR TO TRNSPLNT, INCL DISSEC/REM PERINEPH FAT, DIAPH/RTPER ATTAC;  Surgeon: Leona Carry, MD;  Location: MAIN [facility];  Service: Transplant   ??? PR TRANSPLANTATION OF KIDNEY N/A 06/25/2018    Procedure: Priority RENAL ALLOTRANSPLANTATION, IMPLANTATION OF GRAFT; WITHOUT RECIPIENT NEPHRECTOMY;  Surgeon: Leona Carry, MD;  Location: MAIN OR [facility];  Service: Transplant       Family History  The patient's family history includes Hypertension in his father; Kidney disease in his father; No Known Problems in his mother; Polycystic kidney disease in his father..    Social History:  Tobacco use: denies  Alcohol use: denies  Drug use: denies    Review of Systems  A 12 system review of systems was negative except as noted in HPI    PE: Blood pressure 131/84, pulse 74, temperature 37.2 ??C, temperature source Tympanic, height 182.9 cm (6'), weight (!) 112.9 kg (249 lb), SpO2 99 %. Body mass index is 33.77 kg/m??.  General: well appearing NAD  Lungs: clear to auscultation, unlabored breathing   Heart: euvolemic, regular rate and rhythm  Abd: soft, round, incision with staples and minimal surrounding erythema, no drainage, fluctuance. Drain sites with suture in place, minimal erythema and serosanguinous output.   Skin: no rashes, jaundice or skin lesions noted  Ext: no edema, well perfused  Neuro: grossly intact    Test Results  Lab Results   Component Value Date    WBC 8.7 07/08/2018    HGB 9.9 (L) 07/08/2018    HCT 31.0 (L) 07/08/2018    PLT 249 07/08/2018     Lab Results   Component Value Date    NA 140 07/08/2018    K 5.0 07/08/2018    CL 107 07/08/2018    CO2 20.0 (L) 07/08/2018    BUN  42 (H) 07/08/2018    CREATININE 2.82 (H) 07/08/2018    CALCIUM 9.6 07/08/2018    MG 2.1 07/08/2018    PHOS 3.7 07/08/2018     Lab Results   Component Value Date    ALKPHOS 66 06/13/2018    BILITOT 0.4 06/13/2018    BILIDIR 0.30 01/26/2016    PROT 7.6 06/13/2018    ALBUMIN 4.0 07/08/2018    ALT 8 06/13/2018    AST 9 (L) 06/13/2018    GGT 26 06/13/2018     Lab Results   Component Value Date    INR 1.12 06/13/2018    APTT 28.5 06/13/2018       Urine culture 07/03/18 - no growth

## 2018-07-08 NOTE — Unmapped (Signed)
[facility] TRANSPLANT CLINIC PHARMACY NOTE  07/08/2018   Ethan West  [id number]    Medication changes today:   1. Decrease tacrolimus to 3 mg BID  2. Start cholecalciferol 2,000 units daily\  3. Stop cefpodoxime    Education/Adherence tools provided today:  1.provided updated medication list  2. provided additional education on immunosuppression and transplant related medications including reviewing indications of medications, dosing and side effects    Follow up items:  1. goal of understanding indications and dosing of immunosuppression medications  2. Pill box for accuracy  3. BP    Next visit with pharmacy in 1-2 weeks  ____________________________________________________________________    Ethan West is a 43 y.o. male s/p living kidney transplant on 06/25/2018 (Kidney) 2/2 polycystic kidney disease.     Other PMH significant for HTN    Seen by pharmacy today for: medication management and pill box fill and adherence education; last seen by pharmacy first visit     CC:  Patient complains of  fatigue, not being back to normal yet    There were no vitals filed for this visit.    Allergies   Allergen Reactions   ??? Benazepril Rash   ??? Clarithromycin Rash   ??? Penicillins Rash       All medications reviewed and updated.     Medication list includes revisions made during today???s encounter    Outpatient Encounter Medications as of 07/08/2018   Medication Sig Dispense Refill   ??? acetaminophen (TYLENOL) 500 MG tablet Take 2 tablets (1,000 mg total) by mouth every six (6) hours as needed for pain. 100 tablet 0   ??? amLODIPine (NORVASC) 10 MG tablet Take 1 tablet (10 mg total) by mouth daily. 30 tablet 11   ??? aspirin (ECOTRIN) 81 MG tablet Take 1 tablet (81 mg total) by mouth daily. 30 tablet 11   ??? carvedilol (COREG) 6.25 MG tablet Take 1 tablet (6.25 mg total) by mouth Two (2) times a day. 180 tablet 3   ??? cefpodoxime (VANTIN) 200 MG tablet Take 1 tablet (200 mg total) by mouth every twelve (12) hours for 10 days. 20 tablet 0   ??? docusate sodium (COLACE) 100 MG capsule Take 1 capsule (100 mg total) by mouth two (2) times a day as needed for constipation. 60 capsule 0   ??? escitalopram oxalate (LEXAPRO) 10 MG tablet Take 1 tablet (10 mg total) by mouth daily. (Patient taking differently: Take 10 mg by mouth every morning. ) 90 tablet 3   ??? gabapentin (NEURONTIN) 100 MG capsule Take 1 capsule (100 mg total) by mouth nightly for 11 days. 11 capsule 0   ??? magnesium oxide-Mg AA chelate (MAGNESIUM, AMINO ACID CHELATE,) 133 mg Tab Take 1 tablet by mouth Two (2) times a day. HOLD until directed to start by your coordinator. 60 tablet 11   ??? mycophenolate (MYFORTIC) 180 MG EC tablet Take 3 tablets (540 mg total) by mouth Two (2) times a day. 540 tablet 3   ??? polyethylene glycol (MIRALAX) 17 gram packet Take 17 g (1 packet mixed in 4-8 oz of liquid)  by mouth daily as needed. 30 each 0   ??? sodium bicarbonate 650 mg tablet Take 1 tablet (650 mg total) by mouth Three (3) times a day. 90 tablet 11   ??? sulfamethoxazole-trimethoprim (BACTRIM) 400-80 mg per tablet Take 1 tablet (80 mg of trimethoprim total) by mouth Every Monday, Wednesday, and Friday. 12 tablet 5   ??? tacrolimus (PROGRAF) 1 MG  capsule Take 4 capsules (4 mg total) by mouth two (2) times a day. z94.0 240 capsule 11   ??? traMADoL (ULTRAM) 50 mg tablet Take 1-2 tablets (50-100 mg total) by mouth every twelve (12) hours as needed. 30 tablet 0   ??? valGANciclovir (VALCYTE) 450 mg tablet Take 1 tablet (450 mg total) by mouth daily. 30 tablet 2     No facility-administered encounter medications on file as of 07/08/2018.        Induction agent : alemtuzumab    CURRENT IMMUNOSUPPRESSION: tacrolimus 4 mg PO bid  prograf/Envarsus/cyclosporine goal: 8-10   [id number]  mg PO bid    steroid free     Patient complains of HA before bed and tremor    IMMUNOSUPPRESSION DRUG LEVELS:  Lab Results   Component Value Date    Tacrolimus, Trough 3.5 (L) 07/03/2018    Tacrolimus, Trough 7.6 06/28/2018 Tacrolimus, Trough 16.5 (H) 06/27/2018    Tacrolimus, Timed 11.9 07/08/2018    Tacrolimus, Timed 8.9 07/05/2018    Tacrolimus, Timed 2.1 07/01/2018     No results found for: CYCLO  No results found for: EVEROLIMUS  No results found for: SIROLIMUS    Prograf level is accurate 12 hour trough    Graft function: worsening likely due to elevated tacrolimus trough  DSA: ntd  Biopsies to date: ntd  WBC/ANC:  wnl    Plan: Will decrease  tacrolimus to 3 mg BID. Continue to monitor.    OI Prophylaxis:   CMV Status: D+/ R+, moderate risk . CMV prophylaxis: valganciclovir 450 mg daily x 3 months per protocol.  Estimated Creatinine Clearance: 44.3 mL/min (A) (based on SCr of 2.82 mg/dL (H)).  No results found for: CMVCP  PCP Prophylaxis: bactrim SS 1 tab MWF x 6 months.  Thrush: completed in hospital  Patient is  tolerating infectious prophylaxis well    Plan: Continue per protocol. Continue to monitor.    Dirty UA  Meds currently on: cefpodoxime 200 mg BID x10d started 07/01/18  Plan: stop cefpodoxime given clean UCx    CV Prophylaxis: asa 81 mg   The 10-year ASCVD risk score Denman George DC Jr., et al., 2013) is: 0.9%  Statin therapy: Not indicated; currently on no statin  Plan: not indicated. Continue to monitor     BP: Goal < 140/90. Clinic vitals reported above  Home BP ranges: 130-150/80-90s  Current meds include: amlodipine 10 mg daily, carvedilol 6.25 mg BID  Plan: slightly above goal but will wait for a few more days before adjusting as amlodipine was increased on 6/12. Continue to monitor    Anemia of CKD:  H/H:   Lab Results   Component Value Date    HGB 9.2 (L) 07/05/2018     Lab Results   Component Value Date    HCT 29.6 (L) 07/05/2018     Iron panel:  Lab Results   Component Value Date    IRON 50 06/12/2017    TIBC 280.0 06/12/2017    FERRITIN 34.5 06/12/2017     Lab Results   Component Value Date    Iron Saturation (%) 18 (L) 06/12/2017       Prior ESA use: none post transplant    Plan: stable. Continue to monitor.     DM: Lab Results   Component Value Date    A1C 5.0 06/13/2018   . Goal A1c < 7  History of Dm? No  Diet:did not address  Exercise:did not address  Fluid intake: 6-8  bottles of water daily  Plan:  Monitor FBG    Electrolytes: wnl  Meds currently on: sodium bicarbonate 975 mg BID  Plan: Continue to monitor     GI/BM: pt reports no diarrhea  Meds currently on: Colace PRN (not using), Miralax PRN (not using)  Plan: Continue to monitor    Pain: pt reports moderate incisional site pain  Meds currently on: APAP 500 mg BID, tramadol PRN (using HS PRN)  Plan: Recommended to use APAP first.  Removed 1 drain today. Continue to monitor    Bone health:   Vitamin D Level: 24.0 on 07/03/18. Goal > 30.   Last DEXA results:  none available  Current meds include: none  Plan: Vitamin D level  out of goal,start supplementation with choleclaciferol 2,000 units dailyContinue to monitor.     Women's/Men's Health:  Ethan West is a 43 y.o. male. Patient reports no men's/women's health issues  Plan: Continue to monitor    Anxiety/depression  Meds currently on: escitalopram 10 mg daily  Plan: continue to monitor    Adherence: Patient has poor understanding of medications; was able to independently identify names/doses of immunosuppressants but not OI meds  Patient  does not fill their own pill box on a regular basis at home.  Wife has been managing  Patient brought medication card:yes  Pill box:did not bring  Plan: Encouraged patient to bring filled pill box to next visit; provided extensive adherence counseling/intervention    Spent approximately 40 minutes on educating this patient and greater than 50% was spent in direct face to face counseling regarding post transplant medication education. Questions and concerns were address to patient's satisfaction.    Patient was reviewed with Dr. Rush Barer who was agreement with the stated plan:     During this visit, the following was completed:   BG log data assessment  BP log data assessment  Labs ordered and evaluated  complex treatment plan >1 DS   Patient education was completed for 11-24 minutes     All questions/concerns were addressed to the patient's satisfaction.  __________________________________________  Cleone Slim, PHARMD, CPP  [employer] [employer] TRANSPLANT CLINICAL PHARMACIST PRACTITIONER  PAGER [phone]

## 2018-07-08 NOTE — Unmapped (Signed)
Tac level reviewed with Dr. Farrel Gobble, will decrease dose to 3mg  bid. Pt verbalized understanding.

## 2018-07-10 ENCOUNTER — Encounter: Admit: 2018-07-10 | Discharge: 2018-07-11 | Payer: BLUE CROSS/BLUE SHIELD

## 2018-07-10 DIAGNOSIS — Z940 Kidney transplant status: Principal | ICD-10-CM

## 2018-07-10 DIAGNOSIS — Z79899 Other long term (current) drug therapy: Secondary | ICD-10-CM

## 2018-07-10 LAB — CALCIUM: Calcium:MCnc:Pt:Ser/Plas:Qn:: 9.9

## 2018-07-10 LAB — CBC W/ AUTO DIFF
BASOPHILS ABSOLUTE COUNT: 0 10*9/L (ref 0.0–0.1)
BASOPHILS RELATIVE PERCENT: 0.1 %
EOSINOPHILS ABSOLUTE COUNT: 0 10*9/L (ref 0.0–0.4)
EOSINOPHILS RELATIVE PERCENT: 0.4 %
HEMATOCRIT: 29.4 % — ABNORMAL LOW (ref 41.0–53.0)
HEMOGLOBIN: 9.6 g/dL — ABNORMAL LOW (ref 13.5–17.5)
LARGE UNSTAINED CELLS: 0 % (ref 0–4)
LYMPHOCYTES ABSOLUTE COUNT: 0 10*9/L — ABNORMAL LOW (ref 1.5–5.0)
LYMPHOCYTES RELATIVE PERCENT: 0.5 %
MEAN CORPUSCULAR HEMOGLOBIN: 26.4 pg (ref 26.0–34.0)
MEAN CORPUSCULAR VOLUME: 80.5 fL (ref 80.0–100.0)
MEAN PLATELET VOLUME: 6.7 fL — ABNORMAL LOW (ref 7.0–10.0)
MONOCYTES ABSOLUTE COUNT: 0.2 10*9/L (ref 0.2–0.8)
MONOCYTES RELATIVE PERCENT: 2.4 %
NEUTROPHILS ABSOLUTE COUNT: 7.5 10*9/L (ref 2.0–7.5)
NEUTROPHILS RELATIVE PERCENT: 96.2 %
RED BLOOD CELL COUNT: 3.65 10*12/L — ABNORMAL LOW (ref 4.50–5.90)
RED CELL DISTRIBUTION WIDTH: 15.5 % — ABNORMAL HIGH (ref 12.0–15.0)
WBC ADJUSTED: 7.8 10*9/L (ref 4.5–11.0)

## 2018-07-10 LAB — RENAL FUNCTION PANEL
ANION GAP: 11 mmol/L (ref 7–15)
BLOOD UREA NITROGEN: 43 mg/dL — ABNORMAL HIGH (ref 7–21)
BUN / CREAT RATIO: 14
CALCIUM: 9.9 mg/dL (ref 8.5–10.2)
CHLORIDE: 109 mmol/L — ABNORMAL HIGH (ref 98–107)
CO2: 22 mmol/L (ref 22.0–30.0)
EGFR CKD-EPI AA MALE: 28 mL/min/{1.73_m2} — ABNORMAL LOW (ref >=60)
EGFR CKD-EPI NON-AA MALE: 24 mL/min/{1.73_m2} — ABNORMAL LOW (ref >=60)
GLUCOSE RANDOM: 99 mg/dL (ref 70–179)
PHOSPHORUS: 3.9 mg/dL (ref 2.9–4.7)
POTASSIUM: 5.2 mmol/L — ABNORMAL HIGH (ref 3.5–5.0)
SODIUM: 142 mmol/L (ref 135–145)

## 2018-07-10 LAB — TACROLIMUS BLOOD: Lab: 9.3

## 2018-07-10 LAB — NEUTROPHILS ABSOLUTE COUNT: Lab: 7.5

## 2018-07-10 LAB — MAGNESIUM: Magnesium:MCnc:Pt:Ser/Plas:Qn:: 2.2

## 2018-07-11 NOTE — Unmapped (Signed)
Pt called and c/o some blood in his urine.  He denies any clots.  I told him it was most likely the stent causing the little bit of bleeding.  He will call me if he develops clots, more bleeding or does not void.  He will get labs as planned tomorrow and will make adjustments as needed based on those labs.

## 2018-07-12 ENCOUNTER — Encounter: Admit: 2018-07-12 | Discharge: 2018-07-13 | Payer: BLUE CROSS/BLUE SHIELD

## 2018-07-12 DIAGNOSIS — Z940 Kidney transplant status: Principal | ICD-10-CM

## 2018-07-12 DIAGNOSIS — Z79899 Other long term (current) drug therapy: Secondary | ICD-10-CM

## 2018-07-12 LAB — RENAL FUNCTION PANEL
ALBUMIN: 4.2 g/dL (ref 3.5–5.0)
ANION GAP: 9 mmol/L (ref 7–15)
BLOOD UREA NITROGEN: 44 mg/dL — ABNORMAL HIGH (ref 7–21)
BUN / CREAT RATIO: 15
CALCIUM: 9.7 mg/dL (ref 8.5–10.2)
CHLORIDE: 109 mmol/L — ABNORMAL HIGH (ref 98–107)
CO2: 21 mmol/L — ABNORMAL LOW (ref 22.0–30.0)
EGFR CKD-EPI AA MALE: 29 mL/min/{1.73_m2} — ABNORMAL LOW (ref >=60)
EGFR CKD-EPI NON-AA MALE: 25 mL/min/{1.73_m2} — ABNORMAL LOW (ref >=60)
GLUCOSE RANDOM: 103 mg/dL (ref 70–179)
POTASSIUM: 5.7 mmol/L — ABNORMAL HIGH (ref 3.5–5.0)
SODIUM: 139 mmol/L (ref 135–145)

## 2018-07-12 LAB — CBC W/ AUTO DIFF
BASOPHILS ABSOLUTE COUNT: 0 10*9/L (ref 0.0–0.1)
EOSINOPHILS ABSOLUTE COUNT: 0.1 10*9/L (ref 0.0–0.4)
EOSINOPHILS RELATIVE PERCENT: 1.1 %
HEMATOCRIT: 28.8 % — ABNORMAL LOW (ref 41.0–53.0)
LARGE UNSTAINED CELLS: 1 % (ref 0–4)
LYMPHOCYTES RELATIVE PERCENT: 0.4 %
MEAN CORPUSCULAR HEMOGLOBIN: 26.2 pg (ref 26.0–34.0)
MEAN CORPUSCULAR VOLUME: 80.3 fL (ref 80.0–100.0)
MEAN PLATELET VOLUME: 6.3 fL — ABNORMAL LOW (ref 7.0–10.0)
MONOCYTES ABSOLUTE COUNT: 0.1 10*9/L — ABNORMAL LOW (ref 0.2–0.8)
MONOCYTES RELATIVE PERCENT: 2.1 %
NEUTROPHILS ABSOLUTE COUNT: 5.7 10*9/L (ref 2.0–7.5)
NEUTROPHILS RELATIVE PERCENT: 95.6 %
PLATELET COUNT: 229 10*9/L (ref 150–440)
RED BLOOD CELL COUNT: 3.59 10*12/L — ABNORMAL LOW (ref 4.50–5.90)
RED CELL DISTRIBUTION WIDTH: 15.5 % — ABNORMAL HIGH (ref 12.0–15.0)
WBC ADJUSTED: 6 10*9/L (ref 4.5–11.0)

## 2018-07-12 LAB — BASOPHILS ABSOLUTE COUNT: Lab: 0

## 2018-07-12 LAB — MAGNESIUM: Magnesium:MCnc:Pt:Ser/Plas:Qn:: 1.9

## 2018-07-12 LAB — TACROLIMUS BLOOD: Lab: 9.2

## 2018-07-12 LAB — GLUCOSE RANDOM: Glucose:MCnc:Pt:Ser/Plas:Qn:: 103

## 2018-07-15 ENCOUNTER — Ambulatory Visit: Admit: 2018-07-15 | Discharge: 2018-07-16 | Payer: BLUE CROSS/BLUE SHIELD

## 2018-07-15 DIAGNOSIS — Z940 Kidney transplant status: Principal | ICD-10-CM

## 2018-07-15 DIAGNOSIS — Z79899 Other long term (current) drug therapy: Secondary | ICD-10-CM

## 2018-07-15 LAB — RENAL FUNCTION PANEL
ALBUMIN: 4.5 g/dL (ref 3.5–5.0)
ANION GAP: 9 mmol/L (ref 7–15)
BUN / CREAT RATIO: 14
CALCIUM: 9.9 mg/dL (ref 8.5–10.2)
CHLORIDE: 110 mmol/L — ABNORMAL HIGH (ref 98–107)
CO2: 21 mmol/L — ABNORMAL LOW (ref 22.0–30.0)
CREATININE: 2.88 mg/dL — ABNORMAL HIGH (ref 0.70–1.30)
EGFR CKD-EPI AA MALE: 30 mL/min/{1.73_m2} — ABNORMAL LOW (ref >=60)
EGFR CKD-EPI NON-AA MALE: 26 mL/min/{1.73_m2} — ABNORMAL LOW (ref >=60)
GLUCOSE RANDOM: 105 mg/dL (ref 70–179)
PHOSPHORUS: 3.6 mg/dL (ref 2.9–4.7)
POTASSIUM: 5.3 mmol/L — ABNORMAL HIGH (ref 3.5–5.0)

## 2018-07-15 LAB — CBC W/ AUTO DIFF
BASOPHILS ABSOLUTE COUNT: 0 10*9/L (ref 0.0–0.1)
BASOPHILS RELATIVE PERCENT: 0.3 %
EOSINOPHILS ABSOLUTE COUNT: 0.1 10*9/L (ref 0.0–0.4)
EOSINOPHILS RELATIVE PERCENT: 2.1 %
HEMATOCRIT: 30.3 % — ABNORMAL LOW (ref 41.0–53.0)
HEMOGLOBIN: 9.9 g/dL — ABNORMAL LOW (ref 13.5–17.5)
LYMPHOCYTES ABSOLUTE COUNT: 0 10*9/L — ABNORMAL LOW (ref 1.5–5.0)
LYMPHOCYTES RELATIVE PERCENT: 0.4 %
MEAN CORPUSCULAR HEMOGLOBIN CONC: 32.7 g/dL (ref 31.0–37.0)
MEAN CORPUSCULAR HEMOGLOBIN: 26.3 pg (ref 26.0–34.0)
MEAN CORPUSCULAR VOLUME: 80.4 fL (ref 80.0–100.0)
MEAN PLATELET VOLUME: 6.9 fL — ABNORMAL LOW (ref 7.0–10.0)
MONOCYTES ABSOLUTE COUNT: 0.1 10*9/L — ABNORMAL LOW (ref 0.2–0.8)
MONOCYTES RELATIVE PERCENT: 2.3 %
NEUTROPHILS ABSOLUTE COUNT: 4.6 10*9/L (ref 2.0–7.5)
PLATELET COUNT: 218 10*9/L (ref 150–440)
RED BLOOD CELL COUNT: 3.77 10*12/L — ABNORMAL LOW (ref 4.50–5.90)
WBC ADJUSTED: 4.8 10*9/L (ref 4.5–11.0)

## 2018-07-15 LAB — BLOOD UREA NITROGEN: Urea nitrogen:MCnc:Pt:Ser/Plas:Qn:: 39 — ABNORMAL HIGH

## 2018-07-15 LAB — MAGNESIUM: Magnesium:MCnc:Pt:Ser/Plas:Qn:: 2.1

## 2018-07-15 LAB — HEMOGLOBIN: Hemoglobin:MCnc:Pt:Bld:Qn:: 9.9 — ABNORMAL LOW

## 2018-07-15 LAB — TACROLIMUS BLOOD: Lab: 9.8

## 2018-07-15 NOTE — Unmapped (Signed)
Called ppt to schedule 4-6 week appt on 7/15 (lab and MD appt only). Will add Pharmacy and Nutrition appts when the template becomes available. Ppt is also aware of the additional appts needed. Melanie Crazier  July 15, 2018 2:51 PM

## 2018-07-16 MED ORDER — CHOLECALCIFEROL (VITAMIN D3) 50 MCG (2,000 UNIT) TABLET
Freq: Every day | ORAL | 0 days
Start: 2018-07-16 — End: ?

## 2018-07-16 MED ORDER — CARVEDILOL 6.25 MG TABLET
ORAL_TABLET | Freq: Two times a day (BID) | ORAL | 3 refills | 90 days | Status: CP
Start: 2018-07-16 — End: 2019-07-16
  Filled 2018-08-20: qty 180, 90d supply, fill #0

## 2018-07-16 NOTE — Unmapped (Signed)
[facility] Clinical Assessment & Refill Coordination Note    Ethan West, DOB: 1975/04/27  Phone: [phone] (home)     All above HIPAA information was verified with patient.     Specialty Medication(s):   Transplant: valgancyclovir 450mg        Patient gets Myfortic and Prograf from [facility].    Current Outpatient Medications   Medication Sig Dispense Refill   ??? acetaminophen (TYLENOL) 500 MG tablet Take 2 tablets (1,000 mg total) by mouth every six (6) hours as needed for pain. 100 tablet 0   ??? amLODIPine (NORVASC) 10 MG tablet Take 1 tablet (10 mg total) by mouth daily. 30 tablet 11   ??? aspirin (ECOTRIN) 81 MG tablet Take 1 tablet (81 mg total) by mouth daily. 30 tablet 11   ??? carvedilol (COREG) 6.25 MG tablet Take 1 tablet (6.25 mg total) by mouth Two (2) times a day. 180 tablet 3   ??? cholecalciferol, vitamin D3, 50 mcg (2,000 unit) tablet Take 1 tablet (2,000 Units total) by mouth daily.     ??? docusate sodium (COLACE) 100 MG capsule Take 1 capsule (100 mg total) by mouth two (2) times a day as needed for constipation. 60 capsule 0   ??? escitalopram oxalate (LEXAPRO) 10 MG tablet Take 1 tablet (10 mg total) by mouth daily. (Patient taking differently: Take 10 mg by mouth every morning. ) 90 tablet 3   ??? gabapentin (NEURONTIN) 100 MG capsule Take 1 capsule (100 mg total) by mouth nightly for 11 days. 11 capsule 0   ??? magnesium oxide-Mg AA chelate (MAGNESIUM, AMINO ACID CHELATE,) 133 mg Tab Take 1 tablet by mouth Two (2) times a day. HOLD until directed to start by your coordinator. 60 tablet 11   ??? mycophenolate (MYFORTIC) 180 MG EC tablet Take 3 tablets (540 mg total) by mouth Two (2) times a day. 540 tablet 3   ??? polyethylene glycol (MIRALAX) 17 gram packet Take 17 g (1 packet mixed in 4-8 oz of liquid)  by mouth daily as needed. 30 each 0   ??? sodium bicarbonate 650 mg tablet Take 1 tablet (650 mg total) by mouth Three (3) times a day. 90 tablet 11   ??? sulfamethoxazole-trimethoprim (BACTRIM) 400-80 mg per tablet Take 1 tablet (80 mg of trimethoprim total) by mouth Every Monday, Wednesday, and Friday. 12 tablet 5   ??? tacrolimus (PROGRAF) 1 MG capsule Take 3 capsules (3 mg total) by mouth two (2) times a day. z94.0 240 capsule 11   ??? traMADoL (ULTRAM) 50 mg tablet Take 1-2 tablets (50-100 mg total) by mouth every twelve (12) hours as needed. 30 tablet 0   ??? valGANciclovir (VALCYTE) 450 mg tablet Take 1 tablet (450 mg total) by mouth daily. 30 tablet 2     No current facility-administered medications for this visit.         Changes to medications: [name] reports no changes at this time.    Allergies   Allergen Reactions   ??? Benazepril Rash   ??? Clarithromycin Rash   ??? Penicillins Rash       Changes to allergies: No    SPECIALTY MEDICATION ADHERENCE     Valganciclovir 450 mg: 13 days of medicine on hand     Medication Adherence    Patient reported X missed doses in the last month:  0  Specialty Medication:  Valganciclovir 450mg   Patient is on additional specialty medications:  No  Specialty medication(s) dose(s) confirmed: Regimen is correct and unchanged.     Are there any concerns with adherence? No    Adherence counseling provided? Not needed    CLINICAL MANAGEMENT AND INTERVENTION      Clinical Benefit Assessment:    Do you feel the medicine is effective or helping your condition? Yes    Clinical Benefit counseling provided? Not needed    Adverse Effects Assessment:    Are you experiencing any side effects? Yes, patient reports experiencing burning sensations from knees down, elbows and hands.. Side effect counseling provided: Will notify clinic.  Patient has tried moisturizering creams but they do not help.    Are you experiencing difficulty administering your medicine? No    Quality of Life Assessment:    How many days over the past month did your kidney transplant  keep you from your normal activities? For example, brushing your teeth or getting up in the morning. 0    Have you discussed this with your provider? Not needed    Therapy Appropriateness:    Is therapy appropriate? Yes, therapy is appropriate and should be continued    DISEASE/MEDICATION-SPECIFIC INFORMATION      N/A    PATIENT SPECIFIC NEEDS     ? Does the patient have any physical, cognitive, or cultural barriers? No    ? Is the patient high risk? Yes, patient taking a REMS drug     ? Does the patient require a Care Management Plan? No     ? Does the patient require physician intervention or other additional services (i.e. nutrition, smoking cessation, social work)? No      SHIPPING     Specialty Medication(s) to be Shipped:   Transplant: valgancyclovir 450mg     Other medication(s) to be shipped: Sodium Bicarb, SMZ-TMP, ASA     Changes to insurance: No    Delivery Scheduled: Yes, Expected medication delivery date: 07/24/2018.     Medication will be delivered via UPS to the confirmed home address in [facility].    The patient will receive a drug information handout for each medication shipped and additional FDA Medication Guides as required.  Verified that patient has previously received a Conservation officer, historic buildings.    All of the patient's questions and concerns have been addressed.    Tera Helper   [facility] Specialty Pharmacist

## 2018-07-17 ENCOUNTER — Encounter: Admit: 2018-07-17 | Discharge: 2018-07-18 | Payer: BLUE CROSS/BLUE SHIELD

## 2018-07-17 DIAGNOSIS — Z940 Kidney transplant status: Principal | ICD-10-CM

## 2018-07-17 DIAGNOSIS — Z79899 Other long term (current) drug therapy: Secondary | ICD-10-CM

## 2018-07-17 LAB — CBC W/ AUTO DIFF
BASOPHILS ABSOLUTE COUNT: 0 10*9/L (ref 0.0–0.1)
EOSINOPHILS ABSOLUTE COUNT: 0.1 10*9/L (ref 0.0–0.4)
EOSINOPHILS RELATIVE PERCENT: 2.4 %
HEMATOCRIT: 29.2 % — ABNORMAL LOW (ref 41.0–53.0)
HEMOGLOBIN: 9.4 g/dL — ABNORMAL LOW (ref 13.5–17.5)
LARGE UNSTAINED CELLS: 1 % (ref 0–4)
LYMPHOCYTES ABSOLUTE COUNT: 0 10*9/L — ABNORMAL LOW (ref 1.5–5.0)
LYMPHOCYTES RELATIVE PERCENT: 0.6 %
MEAN CORPUSCULAR HEMOGLOBIN CONC: 32.2 g/dL (ref 31.0–37.0)
MEAN CORPUSCULAR HEMOGLOBIN: 26.3 pg (ref 26.0–34.0)
MEAN CORPUSCULAR VOLUME: 81.6 fL (ref 80.0–100.0)
MEAN PLATELET VOLUME: 6.8 fL — ABNORMAL LOW (ref 7.0–10.0)
MONOCYTES ABSOLUTE COUNT: 0.1 10*9/L — ABNORMAL LOW (ref 0.2–0.8)
MONOCYTES RELATIVE PERCENT: 2.6 %
NEUTROPHILS ABSOLUTE COUNT: 4.4 10*9/L (ref 2.0–7.5)
NEUTROPHILS RELATIVE PERCENT: 93.5 %
PLATELET COUNT: 237 10*9/L (ref 150–440)
RED BLOOD CELL COUNT: 3.58 10*12/L — ABNORMAL LOW (ref 4.50–5.90)
RED CELL DISTRIBUTION WIDTH: 15.8 % — ABNORMAL HIGH (ref 12.0–15.0)
WBC ADJUSTED: 4.7 10*9/L (ref 4.5–11.0)

## 2018-07-17 LAB — RENAL FUNCTION PANEL
ALBUMIN: 4.5 g/dL (ref 3.5–5.0)
ANION GAP: 11 mmol/L (ref 7–15)
BLOOD UREA NITROGEN: 29 mg/dL — ABNORMAL HIGH (ref 7–21)
BUN / CREAT RATIO: 11
CALCIUM: 9.8 mg/dL (ref 8.5–10.2)
CHLORIDE: 112 mmol/L — ABNORMAL HIGH (ref 98–107)
CO2: 19 mmol/L — ABNORMAL LOW (ref 22.0–30.0)
CREATININE: 2.6 mg/dL — ABNORMAL HIGH (ref 0.70–1.30)
EGFR CKD-EPI AA MALE: 34 mL/min/{1.73_m2} — ABNORMAL LOW (ref >=60)
EGFR CKD-EPI NON-AA MALE: 29 mL/min/{1.73_m2} — ABNORMAL LOW (ref >=60)
GLUCOSE RANDOM: 105 mg/dL (ref 70–179)
PHOSPHORUS: 3.2 mg/dL (ref 2.9–4.7)
SODIUM: 142 mmol/L (ref 135–145)

## 2018-07-17 LAB — SMEAR REVIEW

## 2018-07-17 LAB — TACROLIMUS BLOOD: Lab: 9

## 2018-07-17 LAB — POTASSIUM: Potassium:SCnc:Pt:Ser/Plas:Qn:: 5.4 — ABNORMAL HIGH

## 2018-07-17 LAB — LARGE UNSTAINED CELLS: Lab: 1

## 2018-07-17 LAB — MAGNESIUM: Magnesium:MCnc:Pt:Ser/Plas:Qn:: 2.1

## 2018-07-18 NOTE — Unmapped (Signed)
Called to notify ppt of added pharmacy appt on 7/15 for 4-6 week visit. LM. Melanie Crazier  July 18, 2018 3:38 PM

## 2018-07-19 ENCOUNTER — Encounter: Admit: 2018-07-19 | Discharge: 2018-07-20 | Payer: BLUE CROSS/BLUE SHIELD

## 2018-07-19 DIAGNOSIS — Z940 Kidney transplant status: Principal | ICD-10-CM

## 2018-07-19 DIAGNOSIS — Z79899 Other long term (current) drug therapy: Secondary | ICD-10-CM

## 2018-07-19 LAB — RENAL FUNCTION PANEL
ALBUMIN: 4.5 g/dL (ref 3.5–5.0)
BLOOD UREA NITROGEN: 30 mg/dL — ABNORMAL HIGH (ref 7–21)
BUN / CREAT RATIO: 13
CALCIUM: 10 mg/dL (ref 8.5–10.2)
CHLORIDE: 110 mmol/L — ABNORMAL HIGH (ref 98–107)
CO2: 20 mmol/L — ABNORMAL LOW (ref 22.0–30.0)
CREATININE: 2.33 mg/dL — ABNORMAL HIGH (ref 0.70–1.30)
EGFR CKD-EPI AA MALE: 38 mL/min/{1.73_m2} — ABNORMAL LOW (ref >=60)
EGFR CKD-EPI NON-AA MALE: 33 mL/min/{1.73_m2} — ABNORMAL LOW (ref >=60)
GLUCOSE RANDOM: 97 mg/dL (ref 70–179)
PHOSPHORUS: 2.9 mg/dL (ref 2.9–4.7)
POTASSIUM: 5.8 mmol/L — ABNORMAL HIGH (ref 3.5–5.0)
SODIUM: 139 mmol/L (ref 135–145)

## 2018-07-19 LAB — CBC W/ AUTO DIFF
BASOPHILS ABSOLUTE COUNT: 0 10*9/L (ref 0.0–0.1)
BASOPHILS RELATIVE PERCENT: 0.1 %
EOSINOPHILS ABSOLUTE COUNT: 0.1 10*9/L (ref 0.0–0.4)
EOSINOPHILS RELATIVE PERCENT: 2.5 %
HEMATOCRIT: 29.3 % — ABNORMAL LOW (ref 41.0–53.0)
HEMOGLOBIN: 9.4 g/dL — ABNORMAL LOW (ref 13.5–17.5)
LYMPHOCYTES ABSOLUTE COUNT: 0 10*9/L — ABNORMAL LOW (ref 1.5–5.0)
LYMPHOCYTES RELATIVE PERCENT: 0.5 %
MEAN CORPUSCULAR HEMOGLOBIN CONC: 32.1 g/dL (ref 31.0–37.0)
MEAN CORPUSCULAR HEMOGLOBIN: 26.3 pg (ref 26.0–34.0)
MEAN PLATELET VOLUME: 6.7 fL — ABNORMAL LOW (ref 7.0–10.0)
MONOCYTES ABSOLUTE COUNT: 0.1 10*9/L — ABNORMAL LOW (ref 0.2–0.8)
MONOCYTES RELATIVE PERCENT: 2.1 %
NEUTROPHILS ABSOLUTE COUNT: 4.3 10*9/L (ref 2.0–7.5)
RED BLOOD CELL COUNT: 3.57 10*12/L — ABNORMAL LOW (ref 4.50–5.90)
RED CELL DISTRIBUTION WIDTH: 15.7 % — ABNORMAL HIGH (ref 12.0–15.0)
WBC ADJUSTED: 4.5 10*9/L (ref 4.5–11.0)

## 2018-07-19 LAB — PHOSPHORUS: Phosphate:MCnc:Pt:Ser/Plas:Qn:: 2.9

## 2018-07-19 LAB — HEMOGLOBIN: Hemoglobin:MCnc:Pt:Bld:Qn:: 9.4 — ABNORMAL LOW

## 2018-07-19 LAB — MAGNESIUM: Magnesium:MCnc:Pt:Ser/Plas:Qn:: 1.9

## 2018-07-20 LAB — TACROLIMUS BLOOD: Lab: 7.9

## 2018-07-22 ENCOUNTER — Encounter: Admit: 2018-07-22 | Discharge: 2018-07-22 | Payer: BLUE CROSS/BLUE SHIELD | Attending: Surgery | Primary: Surgery

## 2018-07-22 ENCOUNTER — Encounter: Admit: 2018-07-22 | Discharge: 2018-07-22 | Payer: BLUE CROSS/BLUE SHIELD

## 2018-07-22 DIAGNOSIS — Z940 Kidney transplant status: Principal | ICD-10-CM

## 2018-07-22 DIAGNOSIS — Z79899 Other long term (current) drug therapy: Secondary | ICD-10-CM

## 2018-07-22 DIAGNOSIS — D899 Disorder involving the immune mechanism, unspecified: Secondary | ICD-10-CM

## 2018-07-22 LAB — PROTEIN / CREATININE RATIO, URINE
PROTEIN URINE: 25.3 mg/dL
PROTEIN/CREAT RATIO, URINE: 0.194

## 2018-07-22 LAB — CBC W/ AUTO DIFF
BASOPHILS ABSOLUTE COUNT: 0 10*9/L (ref 0.0–0.1)
EOSINOPHILS ABSOLUTE COUNT: 0.2 10*9/L (ref 0.0–0.4)
EOSINOPHILS RELATIVE PERCENT: 3.7 %
HEMATOCRIT: 28.5 % — ABNORMAL LOW (ref 41.0–53.0)
HEMOGLOBIN: 9 g/dL — ABNORMAL LOW (ref 13.5–17.5)
LARGE UNSTAINED CELLS: 1 % (ref 0–4)
LYMPHOCYTES ABSOLUTE COUNT: 0 10*9/L — ABNORMAL LOW (ref 1.5–5.0)
LYMPHOCYTES RELATIVE PERCENT: 0.9 %
MEAN CORPUSCULAR HEMOGLOBIN CONC: 31.7 g/dL (ref 31.0–37.0)
MEAN PLATELET VOLUME: 8.1 fL (ref 7.0–10.0)
MONOCYTES ABSOLUTE COUNT: 0.2 10*9/L (ref 0.2–0.8)
NEUTROPHILS ABSOLUTE COUNT: 4.2 10*9/L (ref 2.0–7.5)
NEUTROPHILS RELATIVE PERCENT: 90.1 %
PLATELET COUNT: 283 10*9/L (ref 150–440)
RED BLOOD CELL COUNT: 3.52 10*12/L — ABNORMAL LOW (ref 4.50–5.90)
RED CELL DISTRIBUTION WIDTH: 16.8 % — ABNORMAL HIGH (ref 12.0–15.0)
WBC ADJUSTED: 4.6 10*9/L (ref 4.5–11.0)

## 2018-07-22 LAB — RENAL FUNCTION PANEL
ALBUMIN: 4.2 g/dL (ref 3.5–5.0)
ANION GAP: 10 mmol/L (ref 7–15)
BLOOD UREA NITROGEN: 29 mg/dL — ABNORMAL HIGH (ref 7–21)
CHLORIDE: 108 mmol/L — ABNORMAL HIGH (ref 98–107)
CO2: 19 mmol/L — ABNORMAL LOW (ref 22.0–30.0)
CREATININE: 2.32 mg/dL — ABNORMAL HIGH (ref 0.70–1.30)
EGFR CKD-EPI AA MALE: 39 mL/min/{1.73_m2} — ABNORMAL LOW (ref >=60)
EGFR CKD-EPI NON-AA MALE: 33 mL/min/{1.73_m2} — ABNORMAL LOW (ref >=60)
GLUCOSE RANDOM: 93 mg/dL (ref 70–179)
PHOSPHORUS: 2.9 mg/dL (ref 2.9–4.7)
POTASSIUM: 5.4 mmol/L — ABNORMAL HIGH (ref 3.5–5.0)
SODIUM: 137 mmol/L (ref 135–145)

## 2018-07-22 LAB — URINALYSIS
BILIRUBIN UA: NEGATIVE
GLUCOSE UA: NEGATIVE
HYALINE CASTS: 1 /LPF (ref 0–1)
KETONES UA: NEGATIVE
NITRITE UA: NEGATIVE
PH UA: 5 (ref 5.0–9.0)
PROTEIN UA: 30 — AB
RBC UA: 177 /HPF — ABNORMAL HIGH (ref <=3)
SPECIFIC GRAVITY UA: 1.011 (ref 1.003–1.030)
SQUAMOUS EPITHELIAL: <1 /HPF (ref 0–5)
WBC UA: 11 /HPF — ABNORMAL HIGH (ref <=2)

## 2018-07-22 LAB — POTASSIUM: Potassium:SCnc:Pt:Ser/Plas:Qn:: 5.4 — ABNORMAL HIGH

## 2018-07-22 LAB — TACROLIMUS, TROUGH: Lab: 7.1

## 2018-07-22 LAB — MAGNESIUM: Magnesium:MCnc:Pt:Ser/Plas:Qn:: 1.7

## 2018-07-22 LAB — RBC UA: Lab: 177 — ABNORMAL HIGH

## 2018-07-22 LAB — PROTEIN URINE: Protein:MCnc:Pt:Urine:Qn:: 25.3

## 2018-07-22 LAB — SMEAR REVIEW

## 2018-07-22 LAB — MICROCYTES

## 2018-07-22 MED ORDER — TACROLIMUS 1 MG CAPSULE
ORAL_CAPSULE | Freq: Two times a day (BID) | ORAL | 3 refills | 0.00000 days | Status: CP
Start: 2018-07-22 — End: 2018-07-22

## 2018-07-22 MED ORDER — MYCOPHENOLATE SODIUM 180 MG TABLET,DELAYED RELEASE
ORAL_TABLET | Freq: Two times a day (BID) | ORAL | 3 refills | 0.00000 days | Status: CP
Start: 2018-07-22 — End: 2019-07-22

## 2018-07-22 MED ORDER — TACROLIMUS 1 MG CAPSULE: 3 mg | capsule | Freq: Two times a day (BID) | 3 refills | 0 days | Status: AC

## 2018-07-22 NOTE — Unmapped (Signed)
Urine collected and sent to the lab

## 2018-07-22 NOTE — Unmapped (Signed)
TRANSPLANT SURGERY PROGRESS NOTE    Assessment and Plan  Ethan West is a 43 y.o. 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 Overall doing well, fatigue slowly improving, mild tremors/parathesis likely due to elevated Tacrolimus level, now improving. Voiding well with Cr stable 2.32.     - Removed subcutaneous drain in clinic today   - Continue current blood pressure regimen   - Urology appointment 7/8 for ureteral stent removal  - Follow up with Nephrology on 7/15 at which time staples may be removed    Emilio Aspen, MD  [facility] General Surgery, PGY3    Subjective  Ethan West is a 43 y.o. 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     Overall he is doing well without pain and improved appetite. Still reports fatigue and low energy levels. Is drinking 6-7 (16 oz) bottles of water per day. Denies fevers, chills, nausea or emesis. Urinating frequently, does report some hematuria but denies dysuria since finishing antibiotics. Drain putting out 6-10 cc/day bright yellow.  Blood pressure has improved to SBP 120-135 mmHg on current regimen.     Objective    Vitals:    07/22/18 0937   BP: 140/85   Pulse: 76   Temp: 37.3 ??C   TempSrc: Temporal   SpO2: 99%   Weight: (!) 109 kg (240 lb 3.2 oz)   Height: 182.9 cm (6')      Body mass index is 32.58 kg/m??.     Physical Exam:  General: Well appearing male, resting comfortably in no acute distress  Eyes: Sclera anicteric, EOM intact  Cardiac: Regular rate and rhythm, no appreciable murmurs   Pulmonary: Non labored breathing, stable on room air, lungs clear to auscultation bilaterally   Abdomen: Soft, non-tender, non distended, LLQ incision with staples in place with minimal erythema, well healing, no drainage, JP drain with minimal bright yellow output Extremities: Warm and well perfused, no edema bilaterally   Neuro: Alert and oriented x3    Data Review:  All lab results last 24 hours:    Recent Results (from the past 24 hour(s))   Renal Function Panel    Collection Time: 07/22/18  9:21 AM   Result Value Ref Range    Sodium 137 135 - 145 mmol/L    Potassium 5.4 (H) 3.5 - 5.0 mmol/L    Chloride 108 (H) 98 - 107 mmol/L    CO2 19.0 (L) 22.0 - 30.0 mmol/L    Anion Gap 10 7 - 15 mmol/L    BUN 29 (H) 7 - 21 mg/dL    Creatinine 1.61 (H) 0.70 - 1.30 mg/dL    BUN/Creatinine Ratio 13     EGFR CKD-EPI Non-African American, Male 33 (L) >=60 mL/min/1.32m2    EGFR CKD-EPI African American, Male 39 (L) >=60 mL/min/1.90m2    Glucose 93 70 - 179 mg/dL    Calcium 9.5 8.5 - 09.6 mg/dL    Phosphorus 2.9 2.9 - 4.7 mg/dL    Albumin 4.2 3.5 - 5.0 g/dL   Magnesium Level    Collection Time: 07/22/18  9:21 AM   Result Value Ref Range    Magnesium 1.7 1.6 - 2.2 mg/dL   CBC w/ Differential    Collection Time: 07/22/18  9:21 AM   Result Value Ref Range  WBC 4.6 4.5 - 11.0 10*9/L    RBC 3.52 (L) 4.50 - 5.90 10*12/L    HGB 9.0 (L) 13.5 - 17.5 g/dL    HCT 09.8 (L) 11.9 - 53.0 %    MCV 81.0 80.0 - 100.0 fL    MCH 25.7 (L) 26.0 - 34.0 pg    MCHC 31.7 31.0 - 37.0 g/dL    RDW 14.7 (H) 82.9 - 15.0 %    MPV 8.1 7.0 - 10.0 fL    Platelet 283 150 - 440 10*9/L    Neutrophils % 90.1 %    Lymphocytes % 0.9 %    Monocytes % 4.5 %    Eosinophils % 3.7 %    Basophils % 0.2 %    Absolute Neutrophils 4.2 2.0 - 7.5 10*9/L    Absolute Lymphocytes 0.0 (L) 1.5 - 5.0 10*9/L    Absolute Monocytes 0.2 0.2 - 0.8 10*9/L    Absolute Eosinophils 0.2 0.0 - 0.4 10*9/L    Absolute Basophils 0.0 0.0 - 0.1 10*9/L    Large Unstained Cells 1 0 - 4 %    Microcytosis Slight (A) Not Present    Anisocytosis Slight (A) Not Present    Hypochromasia Marked (A) Not Present         Imaging:  - None

## 2018-07-23 MED FILL — SODIUM BICARBONATE 650 MG TABLET: ORAL | 30 days supply | Qty: 90 | Fill #0

## 2018-07-23 MED FILL — VALGANCICLOVIR 450 MG TABLET: ORAL | 30 days supply | Qty: 30 | Fill #0

## 2018-07-23 MED FILL — VALGANCICLOVIR 450 MG TABLET: 30 days supply | Qty: 30 | Fill #0 | Status: AC

## 2018-07-23 MED FILL — SODIUM BICARBONATE 650 MG TABLET: 30 days supply | Qty: 90 | Fill #0 | Status: AC

## 2018-07-23 MED FILL — SULFAMETHOXAZOLE 400 MG-TRIMETHOPRIM 80 MG TABLET: 28 days supply | Qty: 12 | Fill #0 | Status: AC

## 2018-07-23 MED FILL — ASPIRIN 81 MG TABLET,DELAYED RELEASE: 30 days supply | Qty: 30 | Fill #0 | Status: AC

## 2018-07-23 MED FILL — SULFAMETHOXAZOLE 400 MG-TRIMETHOPRIM 80 MG TABLET: ORAL | 28 days supply | Qty: 12 | Fill #0

## 2018-07-23 MED FILL — ASPIRIN 81 MG TABLET,DELAYED RELEASE: ORAL | 30 days supply | Qty: 30 | Fill #0

## 2018-07-24 ENCOUNTER — Encounter: Admit: 2018-07-24 | Discharge: 2018-07-25 | Payer: BLUE CROSS/BLUE SHIELD

## 2018-07-24 ENCOUNTER — Ambulatory Visit: Admit: 2018-07-24 | Discharge: 2018-07-25 | Payer: BLUE CROSS/BLUE SHIELD

## 2018-07-24 DIAGNOSIS — Z79899 Other long term (current) drug therapy: Secondary | ICD-10-CM

## 2018-07-24 DIAGNOSIS — Z940 Kidney transplant status: Principal | ICD-10-CM

## 2018-07-24 DIAGNOSIS — R399 Unspecified symptoms and signs involving the genitourinary system: Principal | ICD-10-CM

## 2018-07-24 LAB — CBC W/ AUTO DIFF
BASOPHILS ABSOLUTE COUNT: 0 10*9/L (ref 0.0–0.1)
BASOPHILS RELATIVE PERCENT: 0.2 %
EOSINOPHILS ABSOLUTE COUNT: 0.2 10*9/L (ref 0.0–0.4)
EOSINOPHILS RELATIVE PERCENT: 4.2 %
HEMATOCRIT: 31.3 % — ABNORMAL LOW (ref 41.0–53.0)
HEMOGLOBIN: 9.9 g/dL — ABNORMAL LOW (ref 13.5–17.5)
LARGE UNSTAINED CELLS: 1 % (ref 0–4)
LYMPHOCYTES ABSOLUTE COUNT: 0.1 10*9/L — ABNORMAL LOW (ref 1.5–5.0)
LYMPHOCYTES RELATIVE PERCENT: 1.6 %
MEAN CORPUSCULAR HEMOGLOBIN CONC: 31.7 g/dL (ref 31.0–37.0)
MEAN CORPUSCULAR VOLUME: 81.5 fL (ref 80.0–100.0)
MEAN PLATELET VOLUME: 6.7 fL — ABNORMAL LOW (ref 7.0–10.0)
MONOCYTES ABSOLUTE COUNT: 0.2 10*9/L (ref 0.2–0.8)
MONOCYTES RELATIVE PERCENT: 4.1 %
NEUTROPHILS RELATIVE PERCENT: 88.7 %
PLATELET COUNT: 267 10*9/L (ref 150–440)
RED BLOOD CELL COUNT: 3.84 10*12/L — ABNORMAL LOW (ref 4.50–5.90)
RED CELL DISTRIBUTION WIDTH: 16.4 % — ABNORMAL HIGH (ref 12.0–15.0)
WBC ADJUSTED: 3.7 10*9/L — ABNORMAL LOW (ref 4.5–11.0)

## 2018-07-24 LAB — RENAL FUNCTION PANEL
ALBUMIN: 4.7 g/dL (ref 3.5–5.0)
ANION GAP: 10 mmol/L (ref 7–15)
BLOOD UREA NITROGEN: 27 mg/dL — ABNORMAL HIGH (ref 7–21)
BUN / CREAT RATIO: 12
CALCIUM: 10 mg/dL (ref 8.5–10.2)
CHLORIDE: 111 mmol/L — ABNORMAL HIGH (ref 98–107)
CREATININE: 2.18 mg/dL — ABNORMAL HIGH (ref 0.70–1.30)
EGFR CKD-EPI AA MALE: 42 mL/min/{1.73_m2} — ABNORMAL LOW (ref >=60)
EGFR CKD-EPI NON-AA MALE: 36 mL/min/{1.73_m2} — ABNORMAL LOW (ref >=60)
GLUCOSE RANDOM: 101 mg/dL (ref 70–179)
POTASSIUM: 5.4 mmol/L — ABNORMAL HIGH (ref 3.5–5.0)
SODIUM: 140 mmol/L (ref 135–145)

## 2018-07-24 LAB — MAGNESIUM: Magnesium:MCnc:Pt:Ser/Plas:Qn:: 1.9

## 2018-07-24 LAB — HEMOGLOBIN: Hemoglobin:MCnc:Pt:Bld:Qn:: 9.9 — ABNORMAL LOW

## 2018-07-24 LAB — CREATININE: Creatinine:MCnc:Pt:Ser/Plas:Qn:: 2.18 — ABNORMAL HIGH

## 2018-07-24 LAB — TACROLIMUS BLOOD: Lab: 6.2

## 2018-07-24 LAB — CMV DNA, QUANTITATIVE, PCR

## 2018-07-24 LAB — CMV COMMENT: Lab: 0

## 2018-07-24 NOTE — Unmapped (Signed)
Cystoscopy, ureteral stent removal    Timeout was performed immediately prior to the procedure.    The patient was prepped and draped in the usual sterile fashion.  Flexible cystoscopy was performed.  The indwelling left ureteral stent was visualized, grasped, and removed intact.  The patient tolerated the procedure well and did not require prophylactic antibiotics according to AUA guidelines..    Plan: Follow up with transplant service

## 2018-07-29 ENCOUNTER — Encounter: Admit: 2018-07-29 | Discharge: 2018-07-30 | Payer: BLUE CROSS/BLUE SHIELD

## 2018-07-29 DIAGNOSIS — Z940 Kidney transplant status: Principal | ICD-10-CM

## 2018-07-29 DIAGNOSIS — Z79899 Other long term (current) drug therapy: Secondary | ICD-10-CM

## 2018-07-29 LAB — RENAL FUNCTION PANEL
ALBUMIN: 4.7 g/dL (ref 3.5–5.0)
ANION GAP: 10 mmol/L (ref 7–15)
BLOOD UREA NITROGEN: 26 mg/dL — ABNORMAL HIGH (ref 7–21)
BUN / CREAT RATIO: 14
CALCIUM: 9.9 mg/dL (ref 8.5–10.2)
CHLORIDE: 112 mmol/L — ABNORMAL HIGH (ref 98–107)
CREATININE: 1.9 mg/dL — ABNORMAL HIGH (ref 0.70–1.30)
EGFR CKD-EPI AA MALE: 49 mL/min/{1.73_m2} — ABNORMAL LOW (ref >=60)
EGFR CKD-EPI NON-AA MALE: 43 mL/min/{1.73_m2} — ABNORMAL LOW (ref >=60)
GLUCOSE RANDOM: 90 mg/dL (ref 70–179)
PHOSPHORUS: 2.9 mg/dL (ref 2.9–4.7)
POTASSIUM: 5.4 mmol/L — ABNORMAL HIGH (ref 3.5–5.0)
SODIUM: 140 mmol/L (ref 135–145)

## 2018-07-29 LAB — TACROLIMUS BLOOD: Lab: 11.3

## 2018-07-29 LAB — CBC W/ AUTO DIFF
BASOPHILS ABSOLUTE COUNT: 0 10*9/L (ref 0.0–0.1)
BASOPHILS RELATIVE PERCENT: 0.2 %
EOSINOPHILS ABSOLUTE COUNT: 0.1 10*9/L (ref 0.0–0.4)
EOSINOPHILS RELATIVE PERCENT: 1.8 %
HEMOGLOBIN: 10.6 g/dL — ABNORMAL LOW (ref 13.5–17.5)
LARGE UNSTAINED CELLS: 1 % (ref 0–4)
LYMPHOCYTES ABSOLUTE COUNT: 0 10*9/L — ABNORMAL LOW (ref 1.5–5.0)
LYMPHOCYTES RELATIVE PERCENT: 0.6 %
MEAN CORPUSCULAR HEMOGLOBIN CONC: 31.6 g/dL (ref 31.0–37.0)
MEAN CORPUSCULAR HEMOGLOBIN: 26.8 pg (ref 26.0–34.0)
MEAN CORPUSCULAR VOLUME: 84.8 fL (ref 80.0–100.0)
MEAN PLATELET VOLUME: 6.8 fL — ABNORMAL LOW (ref 7.0–10.0)
MONOCYTES ABSOLUTE COUNT: 0.1 10*9/L — ABNORMAL LOW (ref 0.2–0.8)
NEUTROPHILS ABSOLUTE COUNT: 3.5 10*9/L (ref 2.0–7.5)
NEUTROPHILS RELATIVE PERCENT: 93.2 %
RED BLOOD CELL COUNT: 3.96 10*12/L — ABNORMAL LOW (ref 4.50–5.90)
RED CELL DISTRIBUTION WIDTH: 16.9 % — ABNORMAL HIGH (ref 12.0–15.0)
WBC ADJUSTED: 3.7 10*9/L — ABNORMAL LOW (ref 4.5–11.0)

## 2018-07-29 LAB — NEUTROPHILS RELATIVE PERCENT: Lab: 93.2

## 2018-07-29 LAB — MAGNESIUM: Magnesium:MCnc:Pt:Ser/Plas:Qn:: 1.7

## 2018-07-29 LAB — BLOOD UREA NITROGEN: Urea nitrogen:MCnc:Pt:Ser/Plas:Qn:: 26 — ABNORMAL HIGH

## 2018-07-29 NOTE — Unmapped (Signed)
Labs reviewed with Dr. Farrel Gobble, will watch tacrolimus level before making any other changes.

## 2018-08-01 ENCOUNTER — Ambulatory Visit: Admit: 2018-08-01 | Discharge: 2018-08-02 | Payer: BLUE CROSS/BLUE SHIELD

## 2018-08-01 DIAGNOSIS — Z79899 Other long term (current) drug therapy: Secondary | ICD-10-CM

## 2018-08-01 DIAGNOSIS — Z940 Kidney transplant status: Principal | ICD-10-CM

## 2018-08-01 LAB — CBC W/ AUTO DIFF
BASOPHILS ABSOLUTE COUNT: 0 10*9/L (ref 0.0–0.1)
BASOPHILS RELATIVE PERCENT: 0.1 %
EOSINOPHILS ABSOLUTE COUNT: 0.1 10*9/L (ref 0.0–0.4)
EOSINOPHILS RELATIVE PERCENT: 1.3 %
HEMATOCRIT: 32.6 % — ABNORMAL LOW (ref 41.0–53.0)
HEMOGLOBIN: 10.5 g/dL — ABNORMAL LOW (ref 13.5–17.5)
LARGE UNSTAINED CELLS: 1 % (ref 0–4)
LYMPHOCYTES RELATIVE PERCENT: 1.6 %
MEAN CORPUSCULAR HEMOGLOBIN CONC: 32.2 g/dL (ref 31.0–37.0)
MEAN CORPUSCULAR HEMOGLOBIN: 27.1 pg (ref 26.0–34.0)
MEAN PLATELET VOLUME: 7.9 fL (ref 7.0–10.0)
MONOCYTES ABSOLUTE COUNT: 0.2 10*9/L (ref 0.2–0.8)
MONOCYTES RELATIVE PERCENT: 5.5 %
NEUTROPHILS ABSOLUTE COUNT: 3.2 10*9/L (ref 2.0–7.5)
NEUTROPHILS RELATIVE PERCENT: 90.2 %
PLATELET COUNT: 216 10*9/L (ref 150–440)
RED BLOOD CELL COUNT: 3.87 10*12/L — ABNORMAL LOW (ref 4.50–5.90)
RED CELL DISTRIBUTION WIDTH: 17 % — ABNORMAL HIGH (ref 12.0–15.0)
WBC ADJUSTED: 3.5 10*9/L — ABNORMAL LOW (ref 4.5–11.0)

## 2018-08-01 LAB — RENAL FUNCTION PANEL
ANION GAP: 8 mmol/L (ref 7–15)
BLOOD UREA NITROGEN: 25 mg/dL — ABNORMAL HIGH (ref 7–21)
BUN / CREAT RATIO: 12
CALCIUM: 9.7 mg/dL (ref 8.5–10.2)
CHLORIDE: 114 mmol/L — ABNORMAL HIGH (ref 98–107)
CO2: 20 mmol/L — ABNORMAL LOW (ref 22.0–30.0)
CREATININE: 2.13 mg/dL — ABNORMAL HIGH (ref 0.70–1.30)
EGFR CKD-EPI AA MALE: 43 mL/min/{1.73_m2} — ABNORMAL LOW (ref >=60)
EGFR CKD-EPI NON-AA MALE: 37 mL/min/{1.73_m2} — ABNORMAL LOW (ref >=60)
GLUCOSE RANDOM: 104 mg/dL (ref 70–179)
PHOSPHORUS: 2.5 mg/dL — ABNORMAL LOW (ref 2.9–4.7)
POTASSIUM: 5.4 mmol/L — ABNORMAL HIGH (ref 3.5–5.0)
SODIUM: 142 mmol/L (ref 135–145)

## 2018-08-01 LAB — TACROLIMUS BLOOD: Lab: 9.8

## 2018-08-01 LAB — GLUCOSE RANDOM: Glucose:MCnc:Pt:Ser/Plas:Qn:: 104

## 2018-08-01 LAB — HEMATOCRIT: Lab: 32.6 — ABNORMAL LOW

## 2018-08-01 LAB — MAGNESIUM: Magnesium:MCnc:Pt:Ser/Plas:Qn:: 1.6

## 2018-08-05 ENCOUNTER — Encounter: Admit: 2018-08-05 | Discharge: 2018-08-06 | Payer: BLUE CROSS/BLUE SHIELD

## 2018-08-05 DIAGNOSIS — Z79899 Other long term (current) drug therapy: Secondary | ICD-10-CM

## 2018-08-05 DIAGNOSIS — Z940 Kidney transplant status: Principal | ICD-10-CM

## 2018-08-05 LAB — CBC W/ AUTO DIFF
BASOPHILS ABSOLUTE COUNT: 0 10*9/L (ref 0.0–0.1)
BASOPHILS RELATIVE PERCENT: 0.2 %
EOSINOPHILS ABSOLUTE COUNT: 0.1 10*9/L (ref 0.0–0.4)
EOSINOPHILS RELATIVE PERCENT: 1.1 %
HEMATOCRIT: 34.2 % — ABNORMAL LOW (ref 41.0–53.0)
LARGE UNSTAINED CELLS: 1 % (ref 0–4)
LYMPHOCYTES ABSOLUTE COUNT: 0.1 10*9/L — ABNORMAL LOW (ref 1.5–5.0)
LYMPHOCYTES RELATIVE PERCENT: 1.4 %
MEAN CORPUSCULAR HEMOGLOBIN: 27.1 pg (ref 26.0–34.0)
MEAN CORPUSCULAR VOLUME: 84.1 fL (ref 80.0–100.0)
MEAN PLATELET VOLUME: 7 fL (ref 7.0–10.0)
MONOCYTES ABSOLUTE COUNT: 0.2 10*9/L (ref 0.2–0.8)
MONOCYTES RELATIVE PERCENT: 3.8 %
NEUTROPHILS ABSOLUTE COUNT: 4.4 10*9/L (ref 2.0–7.5)
NEUTROPHILS RELATIVE PERCENT: 92.7 %
PLATELET COUNT: 223 10*9/L (ref 150–440)
RED CELL DISTRIBUTION WIDTH: 17.1 % — ABNORMAL HIGH (ref 12.0–15.0)
WBC ADJUSTED: 4.7 10*9/L (ref 4.5–11.0)

## 2018-08-05 LAB — RENAL FUNCTION PANEL
ALBUMIN: 4.6 g/dL (ref 3.5–5.0)
BLOOD UREA NITROGEN: 26 mg/dL — ABNORMAL HIGH (ref 7–21)
BUN / CREAT RATIO: 13
CALCIUM: 10 mg/dL (ref 8.5–10.2)
CHLORIDE: 113 mmol/L — ABNORMAL HIGH (ref 98–107)
CO2: 20 mmol/L — ABNORMAL LOW (ref 22.0–30.0)
CREATININE: 2.08 mg/dL — ABNORMAL HIGH (ref 0.70–1.30)
EGFR CKD-EPI NON-AA MALE: 38 mL/min/{1.73_m2} — ABNORMAL LOW (ref >=60)
GLUCOSE RANDOM: 106 mg/dL — ABNORMAL HIGH (ref 70–99)
PHOSPHORUS: 2.4 mg/dL — ABNORMAL LOW (ref 2.9–4.7)
POTASSIUM: 5.3 mmol/L — ABNORMAL HIGH (ref 3.5–5.0)
SODIUM: 140 mmol/L (ref 135–145)

## 2018-08-05 LAB — CHLORIDE: Chloride:SCnc:Pt:Ser/Plas:Qn:: 113 — ABNORMAL HIGH

## 2018-08-05 LAB — NEUTROPHILS RELATIVE PERCENT: Lab: 92.7

## 2018-08-05 LAB — MAGNESIUM: Magnesium:MCnc:Pt:Ser/Plas:Qn:: 1.6

## 2018-08-05 LAB — TACROLIMUS BLOOD: Lab: 10.2

## 2018-08-06 MED ORDER — TACROLIMUS 1 MG CAPSULE
ORAL_CAPSULE | Freq: Two times a day (BID) | ORAL | 3 refills | 120.00000 days
Start: 2018-08-06 — End: 2018-08-13

## 2018-08-07 ENCOUNTER — Encounter: Admit: 2018-08-07 | Discharge: 2018-08-07 | Payer: BLUE CROSS/BLUE SHIELD | Attending: Nephrology | Primary: Nephrology

## 2018-08-07 ENCOUNTER — Encounter: Admit: 2018-08-07 | Discharge: 2018-08-07 | Payer: BLUE CROSS/BLUE SHIELD

## 2018-08-07 DIAGNOSIS — Z79899 Other long term (current) drug therapy: Secondary | ICD-10-CM

## 2018-08-07 DIAGNOSIS — Z940 Kidney transplant status: Principal | ICD-10-CM

## 2018-08-07 LAB — RENAL FUNCTION PANEL
ALBUMIN: 4.3 g/dL (ref 3.5–5.0)
ANION GAP: 11 mmol/L (ref 7–15)
BLOOD UREA NITROGEN: 25 mg/dL — ABNORMAL HIGH (ref 7–21)
BUN / CREAT RATIO: 12
CALCIUM: 9.6 mg/dL (ref 8.5–10.2)
CREATININE: 2.01 mg/dL — ABNORMAL HIGH (ref 0.70–1.30)
EGFR CKD-EPI AA MALE: 46 mL/min/{1.73_m2} — ABNORMAL LOW (ref >=60)
EGFR CKD-EPI NON-AA MALE: 40 mL/min/{1.73_m2} — ABNORMAL LOW (ref >=60)
GLUCOSE RANDOM: 101 mg/dL (ref 70–179)
PHOSPHORUS: 2.4 mg/dL — ABNORMAL LOW (ref 2.9–4.7)
POTASSIUM: 5.1 mmol/L — ABNORMAL HIGH (ref 3.5–5.0)
SODIUM: 140 mmol/L (ref 135–145)

## 2018-08-07 LAB — CBC W/ AUTO DIFF
BASOPHILS ABSOLUTE COUNT: 0 10*9/L (ref 0.0–0.1)
BASOPHILS RELATIVE PERCENT: 0.2 %
EOSINOPHILS ABSOLUTE COUNT: 0 10*9/L (ref 0.0–0.4)
EOSINOPHILS RELATIVE PERCENT: 1 %
HEMATOCRIT: 32.4 % — ABNORMAL LOW (ref 41.0–53.0)
HEMOGLOBIN: 10.3 g/dL — ABNORMAL LOW (ref 13.5–17.5)
LARGE UNSTAINED CELLS: 1 % (ref 0–4)
LYMPHOCYTES ABSOLUTE COUNT: 0 10*9/L — ABNORMAL LOW (ref 1.5–5.0)
LYMPHOCYTES RELATIVE PERCENT: 1 %
MEAN CORPUSCULAR HEMOGLOBIN CONC: 31.8 g/dL (ref 31.0–37.0)
MEAN CORPUSCULAR HEMOGLOBIN: 26.8 pg (ref 26.0–34.0)
MEAN CORPUSCULAR VOLUME: 84.3 fL (ref 80.0–100.0)
MEAN PLATELET VOLUME: 8.6 fL (ref 7.0–10.0)
MONOCYTES ABSOLUTE COUNT: 0.2 10*9/L (ref 0.2–0.8)
MONOCYTES RELATIVE PERCENT: 4 %
NEUTROPHILS ABSOLUTE COUNT: 4 10*9/L (ref 2.0–7.5)
NEUTROPHILS RELATIVE PERCENT: 92.7 %
PLATELET COUNT: 199 10*9/L (ref 150–440)
RED BLOOD CELL COUNT: 3.85 10*12/L — ABNORMAL LOW (ref 4.50–5.90)
WBC ADJUSTED: 4.3 10*9/L — ABNORMAL LOW (ref 4.5–11.0)

## 2018-08-07 LAB — URINALYSIS
BACTERIA: NONE SEEN /HPF
GLUCOSE UA: NEGATIVE
KETONES UA: NEGATIVE
LEUKOCYTE ESTERASE UA: NEGATIVE
NITRITE UA: NEGATIVE
PH UA: 5 (ref 5.0–9.0)
PROTEIN UA: NEGATIVE
RBC UA: 7 /HPF — ABNORMAL HIGH (ref <=3)
SQUAMOUS EPITHELIAL: <1 /HPF (ref 0–5)
UROBILINOGEN UA: 0.2
WBC UA: 3 /HPF — ABNORMAL HIGH (ref <=2)

## 2018-08-07 LAB — CHLORIDE: Chloride:SCnc:Pt:Ser/Plas:Qn:: 108 — ABNORMAL HIGH

## 2018-08-07 LAB — SMEAR REVIEW

## 2018-08-07 LAB — PROTEIN / CREATININE RATIO, URINE
PROTEIN URINE: 14.8 mg/dL
PROTEIN/CREAT RATIO, URINE: 0.1

## 2018-08-07 LAB — MAGNESIUM: Magnesium:MCnc:Pt:Ser/Plas:Qn:: 1.5 — ABNORMAL LOW

## 2018-08-07 LAB — BLOOD UA

## 2018-08-07 LAB — PROTEIN URINE: Protein:MCnc:Pt:Urine:Qn:: 14.8

## 2018-08-07 LAB — PLATELET COUNT: Platelets:NCnc:Pt:Bld:Qn:Automated count: 199

## 2018-08-07 LAB — TACROLIMUS, TROUGH: Lab: 11.2

## 2018-08-07 MED ORDER — SODIUM BICARBONATE 650 MG TABLET
ORAL_TABLET | Freq: Two times a day (BID) | ORAL | 11 refills | 30.00000 days | Status: CP
Start: 2018-08-07 — End: 2019-08-07
  Filled 2018-08-20: qty 90, 30d supply, fill #0

## 2018-08-07 NOTE — Unmapped (Signed)
Labs reviewed with Dr. Farrel Gobble, pt is taking 4mg  in the am and 3mg  in the pm, will lower to 3mg  bid.  Pt verbalized understanding.

## 2018-08-07 NOTE — Unmapped (Signed)
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  DSA: not checked    Last CMV checked: not detected - 07/22/18  Last BKV checked: no decoy cells - 07/22/18    Prophylaxis if any - Yes  - Bactrim 80 mg TIW (MWF) (until 12/25/18)  - Valcyte 450 mg daily (until 09/24/18)    2) Immunosuppression: Prograf 3 mg BID / Myfortic 540 mg BID  - Last Prograf level 11.2 on 08/07/18 and Last dose of Prograf: 9PM  - Goal of 12 hour Prograf trough: 8-10  - Changes in Immunosuppression - None today, dose was decreased yesterday to 3 BID.  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    Follow-up: Virtual visit in 4 weeks     History of Presenting Illness:     This is the patient's first visit in transplant clinic post transplant. He was admitted 6/2-06/28/18 for living donor renal transplant (from his mother, 39 y.o.), which he received on 06/25/18. Postoperative course was uncomplicated with no delayed graft function. Patient reports initial diagnosis of polycystic kidney disease in early 2000. He had a ruptured appendix in late '99 vs early '00, and in the process of that workup, was diagnosed with PKD. He reports complications of ruptured cysts every 1-2 years, more recently 2-3 times per year; most recent occurrence was the week prior to his transplant surgery. He experiences pain and hematuria for 3 days to 2 weeks leading up to rupture, with instant relief once the cyst ruptures; may experience some residual bleeding after rupture. He denies history of cysts in the pancreas or liver. Also denies any history of UTI. Reports family history of PKD in his father, sister, and sister's daughter.    Reports HTN control has been inconsistent over the last 20 years. He has been better controlled on current regimen of amlodipine and carvedilol.    He reports history of ibuprofen use for headaches, previously occurring 3 times per week. Headaches are currently resolved. He believes they were occurring back when his BPs were labile.    Since transplant, he has been feeling increased energy. Previously was easily gassed, with constant tiredness and fatigue. This has been steadily improving. He is eager to go back into work. He reports minimal hand tremors, especially when focusing  on small tasks (eating, using scissors, etc). Denies chest pain, shortness of breath, fever, chills, nausea, vomiting, diarrhea, abdominal pain or pain over allograft site.     Last dose of Prograf at 9PM   Diabetes: No   HTN: Yes    Controlled:Yes   CAD/Heartfailure: No   Adherence      With Medication: yes    With Follow up: yes    Functional Status: Independent    Review of Systems:     Fever or chills: negative   Sore throat: negative   Fatigue/malaise: negative   Weight loss or gain: negative   New skin rash/lump or bump: negative   Problems with teeth/gums: negative   Chest pain: negative   Cough or shortness of breath: negative   Swelling: negative Belly pain/heartburn/nausea/vomiting or diarrhea: negative   Pain or bleeding when urinating: negative   Twitching/numbness or weakness: negative     Physical Exam:     BP 130/82 (BP Site: R Arm, BP Position: Sitting, BP Cuff Size: Medium)  - Pulse 72  - Temp 36.6 ??C (97.8 ??F) (Temporal)  - Resp 18  - Ht 182.9 cm (6')  - Wt (!) 108.9 kg (240 lb)  - BMI 32.55 kg/m??     Nursing note and vitals reviewed.   Constitutional: Oriented to person, place, and time. Appears well-developed and well-nourished. No distress.   HENT: patient wearing mask  Head: Normocephalic and atraumatic.   Eyes: Right eye exhibits no discharge. Left eye exhibits no discharge. No scleral icterus.   Neck: patient wearing mask.   Cardiovascular: Normal rate and regular rhythm. Exam reveals no gallop and no friction rub. No murmur heard.   Pulmonary/Chest: Effort normal and breath sounds normal. No respiratory distress.   Abdominal: Soft. Staples in place, no erythema, edema, or discharge  Musculoskeletal: Normal range of motion. No edema and no tenderness.   Neurological: Alert and oriented to person, place, and time.   Skin: Skin is warm and dry. No rash noted. Not diaphoretic. No erythema. No pallor.   Psychiatric: Normal mood and affect. Behavior is normal. Judgment and thought content normal.     Renal Transplant History:    Race: Caucasian   Age of recipient (at time of transplant): 43 y.o.   Cause of kidney disease: ADPKD   Native biopsy: No    Date of transplant: 06/25/2018   Type of transplant: LR    - Donor creatinine: N/A    - Any co morbidities: None    - Infection in donor: none    - Ischemia time: 2h 16m    - Crossmatch: negative    - Donor kidney biopsy (06/25/18): moderate arteriosclerosis     Induction: [place] - Alemtuzumab   Maintenance IS at the time of transplant: tacrolimus, Myfortic   DGF: No    Allergies:   Allergies   Allergen Reactions   ??? Benazepril Rash   ??? Clarithromycin Rash   ??? Penicillins Rash        Current Medications: Current Outpatient Medications   Medication Sig Dispense Refill   ??? acetaminophen (TYLENOL) 500 MG tablet Take 2 tablets (1,000 mg total) by mouth every six (6) hours as needed for pain. 100 tablet 0   ??? amLODIPine (NORVASC) 10 MG tablet Take 1 tablet (10 mg total) by mouth daily. 30 tablet 11   ??? aspirin (ECOTRIN) 81 MG tablet Take 1 tablet (81 mg total) by mouth daily. 30 tablet 11   ??? carvediloL (  COREG) 6.25 MG tablet Take 1 tablet (6.25 mg total) by mouth Two (2) times a day. 180 tablet 3   ??? cholecalciferol, vitamin D3, 50 mcg (2,000 unit) tablet Take 1 tablet (2,000 Units total) by mouth daily.     ??? escitalopram oxalate (LEXAPRO) 10 MG tablet Take 1 tablet (10 mg total) by mouth daily. (Patient taking differently: Take 10 mg by mouth every morning. ) 90 tablet 3   ??? mycophenolate (MYFORTIC) 180 MG EC tablet Take 3 tablets (540 mg total) by mouth Two (2) times a day. 540 tablet 3   ??? sodium bicarbonate 650 mg tablet Take 1.5 tablets (975 mg total) by mouth two (2) times a day. 90 tablet 11   ??? sulfamethoxazole-trimethoprim (BACTRIM) 400-80 mg per tablet Take 1 tablet (80 mg of trimethoprim total) by mouth Every Monday, Wednesday, and Friday. 12 tablet 5   ??? tacrolimus (PROGRAF) 1 MG capsule Take 3 capsules (3 mg total) by mouth two (2) times a day. z94.0 720 capsule 3   ??? valGANciclovir (VALCYTE) 450 mg tablet Take 1 tablet (450 mg total) by mouth daily. 30 tablet 2     No current facility-administered medications for this visit.        Past Medical History:   Past Medical History:   Diagnosis Date   ??? ADPKD (autosomal dominant polycystic kidney disease)    ??? Hypertension    ??? Kidney stone    ??? Polycystic kidney disease         Laboratory studies:     Recent Results (from the past 170 hour(s))   Tacrolimus level    Collection Time: 08/01/18  9:05 AM   Result Value Ref Range    Tacrolimus, Timed 9.8 ng/mL   Magnesium Level    Collection Time: 08/01/18  9:05 AM   Result Value Ref Range    Magnesium 1.6 1.6 - 2.2 mg/dL   Renal Function Panel    Collection Time: 08/01/18  9:05 AM   Result Value Ref Range    Sodium 142 135 - 145 mmol/L    Potassium 5.4 (H) 3.5 - 5.0 mmol/L    Chloride 114 (H) 98 - 107 mmol/L    CO2 20.0 (L) 22.0 - 30.0 mmol/L    Anion Gap 8 7 - 15 mmol/L    BUN 25 (H) 7 - 21 mg/dL    Creatinine 1.61 (H) 0.70 - 1.30 mg/dL    BUN/Creatinine Ratio 12     EGFR CKD-EPI Non-African American, Male 37 (L) >=60 mL/min/1.56m2    EGFR CKD-EPI African American, Male 43 (L) >=60 mL/min/1.23m2    Glucose 104 70 - 179 mg/dL    Calcium 9.7 8.5 - 09.6 mg/dL    Phosphorus 2.5 (L) 2.9 - 4.7 mg/dL    Albumin 4.5 3.5 - 5.0 g/dL   CBC w/ Differential    Collection Time: 08/01/18  9:05 AM   Result Value Ref Range    WBC 3.5 (L) 4.5 - 11.0 10*9/L    RBC 3.87 (L) 4.50 - 5.90 10*12/L    HGB 10.5 (L) 13.5 - 17.5 g/dL    HCT 04.5 (L) 40.9 - 53.0 %    MCV 84.3 80.0 - 100.0 fL    MCH 27.1 26.0 - 34.0 pg    MCHC 32.2 31.0 - 37.0 g/dL    RDW 81.1 (H) 91.4 - 15.0 %    MPV 7.9 7.0 - 10.0 fL    Platelet 216 150 - 440 10*9/L  Neutrophils % 90.2 %    Lymphocytes % 1.6 %    Monocytes % 5.5 %    Eosinophils % 1.3 %    Basophils % 0.1 %    Absolute Neutrophils 3.2 2.0 - 7.5 10*9/L    Absolute Lymphocytes 0.1 (L) 1.5 - 5.0 10*9/L    Absolute Monocytes 0.2 0.2 - 0.8 10*9/L    Absolute Eosinophils 0.1 0.0 - 0.4 10*9/L    Absolute Basophils 0.0 0.0 - 0.1 10*9/L    Large Unstained Cells 1 0 - 4 %    Anisocytosis Slight (A) Not Present    Hypochromasia Moderate (A) Not Present   Tacrolimus level    Collection Time: 08/05/18  8:56 AM   Result Value Ref Range    Tacrolimus, Timed 10.2 ng/mL   Magnesium Level    Collection Time: 08/05/18  8:56 AM   Result Value Ref Range    Magnesium 1.6 1.6 - 2.2 mg/dL   Renal Function Panel    Collection Time: 08/05/18  8:56 AM   Result Value Ref Range    Sodium 140 135 - 145 mmol/L    Potassium 5.3 (H) 3.5 - 5.0 mmol/L    Chloride 113 (H) 98 - 107 mmol/L    CO2 20.0 (L) 22.0 - 30.0 mmol/L    Anion Gap 7 7 - 15 mmol/L    BUN 26 (H) 7 - 21 mg/dL    Creatinine 1.61 (H) 0.70 - 1.30 mg/dL    BUN/Creatinine Ratio 13     EGFR CKD-EPI Non-African American, Male 38 (L) >=60 mL/min/1.52m2    EGFR CKD-EPI African American, Male 44 (L) >=60 mL/min/1.63m2    Glucose 106 (H) 70 - 99 mg/dL    Calcium 09.6 8.5 - 04.5 mg/dL    Phosphorus 2.4 (L) 2.9 - 4.7 mg/dL    Albumin 4.6 3.5 - 5.0 g/dL   CBC w/ Differential    Collection Time: 08/05/18  8:56 AM   Result Value Ref Range    WBC 4.7 4.5 - 11.0 10*9/L    RBC 4.07 (L) 4.50 - 5.90 10*12/L    HGB 11.0 (L) 13.5 - 17.5 g/dL    HCT 40.9 (L) 81.1 - 53.0 %    MCV 84.1 80.0 - 100.0 fL    MCH 27.1 26.0 - 34.0 pg    MCHC 32.2 31.0 - 37.0 g/dL    RDW 91.4 (H) 78.2 - 15.0 %    MPV 7.0 7.0 - 10.0 fL    Platelet 223 150 - 440 10*9/L    Neutrophils % 92.7 %    Lymphocytes % 1.4 %    Monocytes % 3.8 %    Eosinophils % 1.1 %    Basophils % 0.2 %    Neutrophil Left Shift 1+ (A) Not Present    Absolute Neutrophils 4.4 2.0 - 7.5 10*9/L    Absolute Lymphocytes 0.1 (L) 1.5 - 5.0 10*9/L    Absolute Monocytes 0.2 0.2 - 0.8 10*9/L    Absolute Eosinophils 0.1 0.0 - 0.4 10*9/L    Absolute Basophils 0.0 0.0 - 0.1 10*9/L    Large Unstained Cells 1 0 - 4 %    Anisocytosis Slight (A) Not Present    Hypochromasia Moderate (A) Not Present   Renal Function Panel    Collection Time: 08/07/18  8:12 AM   Result Value Ref Range    Sodium 140 135 - 145 mmol/L    Potassium 5.1 (H) 3.5 - 5.0 mmol/L  Chloride 108 (H) 98 - 107 mmol/L    CO2 21.0 (L) 22.0 - 30.0 mmol/L    Anion Gap 11 7 - 15 mmol/L    BUN 25 (H) 7 - 21 mg/dL    Creatinine 1.61 (H) 0.70 - 1.30 mg/dL    BUN/Creatinine Ratio 12     EGFR CKD-EPI Non-African American, Male 40 (L) >=60 mL/min/1.59m2    EGFR CKD-EPI African American, Male 46 (L) >=60 mL/min/1.52m2    Glucose 101 70 - 179 mg/dL    Calcium 9.6 8.5 - 09.6 mg/dL    Phosphorus 2.4 (L) 2.9 - 4.7 mg/dL    Albumin 4.3 3.5 - 5.0 g/dL   Magnesium Level    Collection Time: 08/07/18  8:12 AM   Result Value Ref Range    Magnesium 1.5 (L) 1.6 - 2.2 mg/dL   CBC w/ Differential    Collection Time: 08/07/18  8:12 AM   Result Value Ref Range    WBC 4.3 (L) 4.5 - 11.0 10*9/L    RBC 3.85 (L) 4.50 - 5.90 10*12/L    HGB 10.3 (L) 13.5 - 17.5 g/dL    HCT 04.5 (L) 40.9 - 53.0 %    MCV 84.3 80.0 - 100.0 fL    MCH 26.8 26.0 - 34.0 pg    MCHC 31.8 31.0 - 37.0 g/dL    RDW 81.1 (H) 91.4 - 15.0 %    MPV 8.6 7.0 - 10.0 fL    Platelet 199 150 - 440 10*9/L    Neutrophils % 92.7 %    Lymphocytes % 1.0 %    Monocytes % 4.0 %    Eosinophils % 1.0 %    Basophils % 0.2 %    Absolute Neutrophils 4.0 2.0 - 7.5 10*9/L    Absolute Lymphocytes 0.0 (L) 1.5 - 5.0 10*9/L    Absolute Monocytes 0.2 0.2 - 0.8 10*9/L    Absolute Eosinophils 0.0 0.0 - 0.4 10*9/L    Absolute Basophils 0.0 0.0 - 0.1 10*9/L    Large Unstained Cells 1 0 - 4 %    Microcytosis Slight (A) Not Present    Anisocytosis Slight (A) Not Present    Hypochromasia Marked (A) Not Present       Scribe's Attestation: Leeroy Bock, MD obtained and performed the history, physical exam and medical decision making elements that were entered into the chart.  Signed by Gaye Alken, Scribe, on August 07, 2018 at 9:04 AM.    Documentation assistance provided by the Scribe. I was present during the time the encounter was recorded. The information recorded by the Scribe was done at my direction and has been reviewed and validated by me.

## 2018-08-07 NOTE — Unmapped (Signed)
[facility] PHARMACY NOTE  08/07/2018   Ethan West  [id number]    Medication changes today:   1. None    Education/Adherence tools provided today:  1.provided updated medication list  2. provided additional education on immunosuppression and transplant related medications including reviewing indications of medications, dosing and side effects    Follow up items:  1. goal of understanding indications and dosing of immunosuppression medications    Next visit with pharmacy in 1-3 months  ____________________________________________________________________    Ethan West is a 43 y.o. male s/p living kidney transplant on 06/25/2018 (Kidney) 2/2 polycystic kidney disease.     Other PMH significant for HTN    Seen by pharmacy today for: medication management and pill box fill and adherence education; last seen by pharmacy 1 months ago     CC:  Patient has no complaints today     There were no vitals filed for this visit.    Allergies   Allergen Reactions   ??? Benazepril Rash   ??? Clarithromycin Rash   ??? Penicillins Rash       All medications reviewed and updated.     Medication list includes revisions made during today???s encounter    Outpatient Encounter Medications as of 08/07/2018   Medication Sig Dispense Refill   ??? acetaminophen (TYLENOL) 500 MG tablet Take 2 tablets (1,000 mg total) by mouth every six (6) hours as needed for pain. 100 tablet 0   ??? amLODIPine (NORVASC) 10 MG tablet Take 1 tablet (10 mg total) by mouth daily. 30 tablet 11   ??? aspirin (ECOTRIN) 81 MG tablet Take 1 tablet (81 mg total) by mouth daily. 30 tablet 11   ??? carvediloL (COREG) 6.25 MG tablet Take 1 tablet (6.25 mg total) by mouth Two (2) times a day. 180 tablet 3   ??? cholecalciferol, vitamin D3, 50 mcg (2,000 unit) tablet Take 1 tablet (2,000 Units total) by mouth daily.     ??? escitalopram oxalate (LEXAPRO) 10 MG tablet Take 1 tablet (10 mg total) by mouth daily. (Patient taking differently: Take 10 mg by mouth every morning. ) 90 tablet 3   ??? magnesium oxide-Mg AA chelate (MAGNESIUM, AMINO ACID CHELATE,) 133 mg Tab Take 1 tablet by mouth Two (2) times a day. HOLD until directed to start by your coordinator. (Patient not taking: Reported on 07/22/2018) 60 tablet 11   ??? mycophenolate (MYFORTIC) 180 MG EC tablet Take 3 tablets (540 mg total) by mouth Two (2) times a day. 540 tablet 3   ??? sodium bicarbonate 650 mg tablet Take 1 tablet (650 mg total) by mouth Three (3) times a day. 90 tablet 11   ??? sulfamethoxazole-trimethoprim (BACTRIM) 400-80 mg per tablet Take 1 tablet (80 mg of trimethoprim total) by mouth Every Monday, Wednesday, and Friday. 12 tablet 5   ??? tacrolimus (PROGRAF) 1 MG capsule Take 3 capsules (3 mg total) by mouth two (2) times a day. z94.0 720 capsule 3   ??? valGANciclovir (VALCYTE) 450 mg tablet Take 1 tablet (450 mg total) by mouth daily. 30 tablet 2     No facility-administered encounter medications on file as of 08/07/2018.        Induction agent : alemtuzumab    CURRENT IMMUNOSUPPRESSION: tacrolimus 3 mg PO bid  prograf/Envarsus/cyclosporine goal: 8-10   [id number]  mg PO bid    steroid free     Patient complains of minor tremor    IMMUNOSUPPRESSION DRUG LEVELS:  Lab Results  Component Value Date    Tacrolimus, Trough 7.1 07/22/2018    Tacrolimus, Trough 3.5 (L) 07/03/2018    Tacrolimus, Trough 7.6 06/28/2018    Tacrolimus, Timed 10.2 08/05/2018    Tacrolimus, Timed 9.8 08/01/2018    Tacrolimus, Timed 11.3 07/29/2018     No results found for: CYCLO  No results found for: EVEROLIMUS  No results found for: SIROLIMUS    Prograf level is accurate 12 hour trough    Graft function: stable - patient's donor was 70 which may explain SCr of 2  DSA: ntd  Biopsies to date: ntd  WBC/ANC:  wnl    Plan: Will maintain current immunosuppression as dose was just changed last night. Continue to monitor.    OI Prophylaxis:   CMV Status: D+/ R+, moderate risk . CMV prophylaxis: valganciclovir 450 mg daily x 3 months per protocol.  Estimated Creatinine Clearance: 59 mL/min (A) (based on SCr of 2.08 mg/dL (H)).  No results found for: CMVCP  PCP Prophylaxis: bactrim SS 1 tab MWF x 6 months.  Thrush: completed in hospital  Patient is  tolerating infectious prophylaxis well    Plan: Continue per protocol. Continue to monitor.    CV Prophylaxis: asa 81 mg   The 10-year ASCVD risk score Denman George DC Jr., et al., 2013) is: 1.4%  Statin therapy: Not indicated; currently on no statin  Plan: not indicated. Continue to monitor     BP: Goal < 140/90. Clinic vitals reported above  Home BP ranges: 130s/80s  Current meds include: amlodipine 10 mg daily, carvedilol 6.25 mg BID  Plan:  Continue to monitor    Anemia of CKD:  H/H:   Lab Results   Component Value Date    HGB 11.0 (L) 08/05/2018     Lab Results   Component Value Date    HCT 34.2 (L) 08/05/2018     Iron panel:  Lab Results   Component Value Date    IRON 50 06/12/2017    TIBC 280.0 06/12/2017    FERRITIN 34.5 06/12/2017     Lab Results   Component Value Date    Iron Saturation (%) 18 (L) 06/12/2017       Prior ESA use: none post transplant    Plan: stable. Continue to monitor.     DM:   Lab Results   Component Value Date    A1C 5.0 06/13/2018   . Goal A1c < 7  History of Dm? No  Diet:did not address  Exercise:did not address  Fluid intake: 6-8 bottles of water daily  Plan:  Continue to monitor    Electrolytes: wnl  Meds currently on: sodium bicarbonate 975 mg BID  Plan: Continue to monitor     GI/BM: pt reports no diarrhea  Meds currently on: Colace PRN (not using), Miralax PRN (not using)  Plan: Continue to monitor    Pain: pt reports moderate incisional site pain  Meds currently on: APAP 500 mg BID, tramadol PRN (using HS PRN)  Plan:  Continue to monitor    Bone health:   Vitamin D Level: 24.0 on 07/03/18. Goal > 30.   Last DEXA results:  none available  Current meds include: Vitamin D3 2,000 units   Plan: Vitamin D level  out of goal,continue supplementation.  Continue to monitor. Women's/Men's Health:  Ethan West is a 43 y.o. male. Patient reports no men's/women's health issues  Plan: Continue to monitor    Anxiety/depression  Meds currently on: escitalopram 10 mg daily  Plan: continue to monitor    Adherence: Patient has poor understanding of medications; was able to independently identify names/doses of immunosuppressants but not OI meds  Patient  does not fill their own pill box on a regular basis at home.  Wife has been managing  Patient brought medication card:yes  Pill box:was correct  Plan: Encouraged patient to bring filled pill box to next visit; provided extensive adherence counseling/intervention    Spent approximately 30 minutes on educating this patient and greater than 50% was spent in direct face to face counseling regarding post transplant medication education. Questions and concerns were address to patient's satisfaction.    Patient was reviewed with Dr. Nestor Lewandowsky who was agreement with the stated plan:     During this visit, the following was completed:   BG log data assessment  BP log data assessment  Labs ordered and evaluated  complex treatment plan >1 DS   Patient education was completed for 11-24 minutes     All questions/concerns were addressed to the patient's satisfaction.  __________________________________________  Cleone Slim, PHARMD, CPP  [facility] [employer] [facility] CLINICAL PHARMACIST PRACTITIONER  PAGER [phone]

## 2018-08-08 LAB — CMV DNA, QUANTITATIVE, PCR

## 2018-08-08 LAB — CMV VIRAL LD: Lab: NOT DETECTED

## 2018-08-12 ENCOUNTER — Encounter: Admit: 2018-08-12 | Discharge: 2018-08-13 | Payer: BLUE CROSS/BLUE SHIELD

## 2018-08-12 DIAGNOSIS — Z940 Kidney transplant status: Principal | ICD-10-CM

## 2018-08-12 DIAGNOSIS — Z79899 Other long term (current) drug therapy: Secondary | ICD-10-CM

## 2018-08-12 LAB — RENAL FUNCTION PANEL
ALBUMIN: 4.2 g/dL (ref 3.5–5.0)
BLOOD UREA NITROGEN: 24 mg/dL — ABNORMAL HIGH (ref 7–21)
BUN / CREAT RATIO: 12
CALCIUM: 9.5 mg/dL (ref 8.5–10.2)
CHLORIDE: 106 mmol/L (ref 98–107)
CO2: 22 mmol/L (ref 22.0–30.0)
CREATININE: 2 mg/dL — ABNORMAL HIGH (ref 0.70–1.30)
EGFR CKD-EPI AA MALE: 46 mL/min/{1.73_m2} — ABNORMAL LOW (ref >=60)
EGFR CKD-EPI NON-AA MALE: 40 mL/min/{1.73_m2} — ABNORMAL LOW (ref >=60)
GLUCOSE RANDOM: 106 mg/dL (ref 70–179)
PHOSPHORUS: 2.7 mg/dL — ABNORMAL LOW (ref 2.9–4.7)
POTASSIUM: 5 mmol/L (ref 3.5–5.0)
SODIUM: 139 mmol/L (ref 135–145)

## 2018-08-12 LAB — CBC W/ AUTO DIFF
BASOPHILS ABSOLUTE COUNT: 0 10*9/L (ref 0.0–0.1)
BASOPHILS RELATIVE PERCENT: 0.2 %
EOSINOPHILS ABSOLUTE COUNT: 0.1 10*9/L (ref 0.0–0.4)
EOSINOPHILS RELATIVE PERCENT: 1.8 %
HEMATOCRIT: 33.8 % — ABNORMAL LOW (ref 41.0–53.0)
HEMOGLOBIN: 10.7 g/dL — ABNORMAL LOW (ref 13.5–17.5)
LARGE UNSTAINED CELLS: 1 % (ref 0–4)
LYMPHOCYTES ABSOLUTE COUNT: 0.1 10*9/L — ABNORMAL LOW (ref 1.5–5.0)
LYMPHOCYTES RELATIVE PERCENT: 2 %
MEAN CORPUSCULAR HEMOGLOBIN CONC: 31.5 g/dL (ref 31.0–37.0)
MEAN CORPUSCULAR HEMOGLOBIN: 26.7 pg (ref 26.0–34.0)
MEAN CORPUSCULAR VOLUME: 84.7 fL (ref 80.0–100.0)
MONOCYTES ABSOLUTE COUNT: 0.2 10*9/L (ref 0.2–0.8)
MONOCYTES RELATIVE PERCENT: 4.1 %
NEUTROPHILS ABSOLUTE COUNT: 3.9 10*9/L (ref 2.0–7.5)
NEUTROPHILS RELATIVE PERCENT: 91 %
PLATELET COUNT: 221 10*9/L (ref 150–440)
RED CELL DISTRIBUTION WIDTH: 16.9 % — ABNORMAL HIGH (ref 12.0–15.0)
WBC ADJUSTED: 4.3 10*9/L — ABNORMAL LOW (ref 4.5–11.0)

## 2018-08-12 LAB — MAGNESIUM: Magnesium:MCnc:Pt:Ser/Plas:Qn:: 1.4 — ABNORMAL LOW

## 2018-08-12 LAB — EGFR CKD-EPI NON-AA MALE: Lab: 40 — ABNORMAL LOW

## 2018-08-12 LAB — EOSINOPHILS RELATIVE PERCENT: Lab: 1.8

## 2018-08-12 LAB — TACROLIMUS BLOOD: Lab: 11

## 2018-08-13 MED ORDER — TACROLIMUS 1 MG CAPSULE
ORAL_CAPSULE | 3 refills | 0 days | Status: CP
Start: 2018-08-13 — End: ?

## 2018-08-13 NOTE — Unmapped (Signed)
Per Dr Nestor Lewandowsky decrease Prograf dose to 3/2    Called and updated patient on recent labs and need to decrease prograf dose to 3mg  in the morning and 2mg  at night. Verbalized understanding

## 2018-08-13 NOTE — Unmapped (Signed)
[facility] Refill Coordination Note    Specialty Medication(s) to be Shipped:   Transplant: valgancyclovir 450mg   Other medication(s) to be shipped: Amlodipine 10mg , Aspirin 81mg , Carvedilol 6.25mg , Escitalopram 10mg , Sodium Bicarb 650mg  & Sulfa-Trime 400-80mg      Ethan West, DOB: Mar 01, 1975  Phone: [phone] (home)     All above HIPAA information was verified with patient.     Completed refill call assessment today to schedule patient's medication shipment from the [facility] [phone]).       Specialty medication(s) and dose(s) confirmed: Regimen is correct and unchanged.   Changes to medications: Ethan West reports no changes reported at this time.  Changes to insurance: No  Questions for the pharmacist: No    Confirmed patient received Welcome Packet with first shipment. The patient will receive a drug information handout for each medication shipped and additional FDA Medication Guides as required.       DISEASE/MEDICATION-SPECIFIC INFORMATION        N/A    SPECIALTY MEDICATION ADHERENCE     Medication Adherence    Patient reported X missed doses in the last month: 0  Specialty Medication: Valganciclovir 450mg   Patient is on additional specialty medications: No       Valganciclovir 450 mg: 8 days of medicine on hand       SHIPPING     Shipping address confirmed in Epic.     Delivery Scheduled: Yes, Expected medication delivery date: 08/20/2018.     Medication will be delivered via UPS to the home address in [employer] [place].    Ethan West P Allena Katz   [facility] Specialty Technician

## 2018-08-19 ENCOUNTER — Encounter: Admit: 2018-08-19 | Discharge: 2018-08-20 | Payer: BLUE CROSS/BLUE SHIELD

## 2018-08-19 DIAGNOSIS — Z940 Kidney transplant status: Principal | ICD-10-CM

## 2018-08-19 DIAGNOSIS — Z79899 Other long term (current) drug therapy: Secondary | ICD-10-CM

## 2018-08-19 LAB — RENAL FUNCTION PANEL
ALBUMIN: 4.5 g/dL (ref 3.5–5.0)
BLOOD UREA NITROGEN: 22 mg/dL — ABNORMAL HIGH (ref 7–21)
BUN / CREAT RATIO: 12
CALCIUM: 10 mg/dL (ref 8.5–10.2)
CHLORIDE: 111 mmol/L — ABNORMAL HIGH (ref 98–107)
CO2: 22 mmol/L (ref 22.0–30.0)
CREATININE: 1.83 mg/dL — ABNORMAL HIGH (ref 0.70–1.30)
EGFR CKD-EPI AA MALE: 51 mL/min/{1.73_m2} — ABNORMAL LOW (ref >=60)
EGFR CKD-EPI NON-AA MALE: 45 mL/min/{1.73_m2} — ABNORMAL LOW (ref >=60)
GLUCOSE RANDOM: 109 mg/dL (ref 70–179)
PHOSPHORUS: 2.5 mg/dL — ABNORMAL LOW (ref 2.9–4.7)
POTASSIUM: 4.8 mmol/L (ref 3.5–5.0)
SODIUM: 141 mmol/L (ref 135–145)

## 2018-08-19 LAB — CBC W/ AUTO DIFF
BASOPHILS ABSOLUTE COUNT: 0 10*9/L (ref 0.0–0.1)
EOSINOPHILS ABSOLUTE COUNT: 0.1 10*9/L (ref 0.0–0.4)
EOSINOPHILS RELATIVE PERCENT: 1.7 %
HEMATOCRIT: 34.2 % — ABNORMAL LOW (ref 41.0–53.0)
HEMOGLOBIN: 10.5 g/dL — ABNORMAL LOW (ref 13.5–17.5)
LARGE UNSTAINED CELLS: 1 % (ref 0–4)
LYMPHOCYTES RELATIVE PERCENT: 1.7 %
MEAN CORPUSCULAR HEMOGLOBIN CONC: 30.8 g/dL — ABNORMAL LOW (ref 31.0–37.0)
MEAN CORPUSCULAR HEMOGLOBIN: 25.9 pg — ABNORMAL LOW (ref 26.0–34.0)
MEAN CORPUSCULAR VOLUME: 84.3 fL (ref 80.0–100.0)
MEAN PLATELET VOLUME: 6.9 fL — ABNORMAL LOW (ref 7.0–10.0)
MONOCYTES ABSOLUTE COUNT: 0.2 10*9/L (ref 0.2–0.8)
MONOCYTES RELATIVE PERCENT: 4 %
NEUTROPHILS ABSOLUTE COUNT: 5.2 10*9/L (ref 2.0–7.5)
NEUTROPHILS RELATIVE PERCENT: 91.5 %
PLATELET COUNT: 220 10*9/L (ref 150–440)
RED BLOOD CELL COUNT: 4.06 10*12/L — ABNORMAL LOW (ref 4.50–5.90)
RED CELL DISTRIBUTION WIDTH: 16.5 % — ABNORMAL HIGH (ref 12.0–15.0)
WBC ADJUSTED: 5.7 10*9/L (ref 4.5–11.0)

## 2018-08-19 LAB — TACROLIMUS BLOOD: Lab: 7

## 2018-08-19 LAB — MEAN CORPUSCULAR VOLUME: Lab: 84.3

## 2018-08-19 LAB — CO2: Carbon dioxide:SCnc:Pt:Ser/Plas:Qn:: 22

## 2018-08-19 LAB — TACROLIMUS LEVEL: TACROLIMUS BLOOD: 7 ng/mL

## 2018-08-19 LAB — MAGNESIUM: Magnesium:MCnc:Pt:Ser/Plas:Qn:: 1.6

## 2018-08-20 MED FILL — VALGANCICLOVIR 450 MG TABLET: 30 days supply | Qty: 30 | Fill #1 | Status: AC

## 2018-08-20 MED FILL — CARVEDILOL 6.25 MG TABLET: 90 days supply | Qty: 180 | Fill #0 | Status: AC

## 2018-08-20 MED FILL — SULFAMETHOXAZOLE 400 MG-TRIMETHOPRIM 80 MG TABLET: ORAL | 28 days supply | Qty: 12 | Fill #1

## 2018-08-20 MED FILL — SODIUM BICARBONATE 650 MG TABLET: 30 days supply | Qty: 90 | Fill #0 | Status: AC

## 2018-08-20 MED FILL — SULFAMETHOXAZOLE 400 MG-TRIMETHOPRIM 80 MG TABLET: 28 days supply | Qty: 12 | Fill #1 | Status: AC

## 2018-08-20 MED FILL — VALGANCICLOVIR 450 MG TABLET: ORAL | 30 days supply | Qty: 30 | Fill #1

## 2018-08-21 MED FILL — ASPIRIN 81 MG TABLET,DELAYED RELEASE: 30 days supply | Qty: 30 | Fill #1 | Status: AC

## 2018-08-21 MED FILL — ASPIRIN 81 MG TABLET,DELAYED RELEASE: ORAL | 30 days supply | Qty: 30 | Fill #1

## 2018-08-21 NOTE — Unmapped (Signed)
Ethan West 's lexapro shipment will be delayed due to No refills and 3 refill request attempted. We have contacted the patient and communicated the delivery change to patient/caregiver We will call the patient to reschedule the delivery upon resolution. We have confirmed the delivery date as NA .

## 2018-08-26 ENCOUNTER — Encounter: Admit: 2018-08-26 | Discharge: 2018-08-27 | Payer: BLUE CROSS/BLUE SHIELD

## 2018-08-26 DIAGNOSIS — Z940 Kidney transplant status: Principal | ICD-10-CM

## 2018-08-26 DIAGNOSIS — Z79899 Other long term (current) drug therapy: Secondary | ICD-10-CM

## 2018-08-26 LAB — RENAL FUNCTION PANEL
ALBUMIN: 4.6 g/dL (ref 3.5–5.0)
BLOOD UREA NITROGEN: 22 mg/dL — ABNORMAL HIGH (ref 7–21)
BUN / CREAT RATIO: 11
CALCIUM: 9.8 mg/dL (ref 8.5–10.2)
CHLORIDE: 112 mmol/L — ABNORMAL HIGH (ref 98–107)
CO2: 18 mmol/L — ABNORMAL LOW (ref 22.0–30.0)
CREATININE: 2.02 mg/dL — ABNORMAL HIGH (ref 0.70–1.30)
EGFR CKD-EPI AA MALE: 46 mL/min/{1.73_m2} — ABNORMAL LOW (ref >=60)
EGFR CKD-EPI NON-AA MALE: 39 mL/min/{1.73_m2} — ABNORMAL LOW (ref >=60)
GLUCOSE RANDOM: 103 mg/dL — ABNORMAL HIGH (ref 70–99)
PHOSPHORUS: 2.5 mg/dL — ABNORMAL LOW (ref 2.9–4.7)
POTASSIUM: 4.4 mmol/L (ref 3.5–5.0)
SODIUM: 143 mmol/L (ref 135–145)

## 2018-08-26 LAB — CBC W/ AUTO DIFF
BASOPHILS ABSOLUTE COUNT: 0 10*9/L (ref 0.0–0.1)
BASOPHILS RELATIVE PERCENT: 0.2 %
EOSINOPHILS ABSOLUTE COUNT: 0.1 10*9/L (ref 0.0–0.4)
EOSINOPHILS RELATIVE PERCENT: 2.7 %
HEMATOCRIT: 34.3 % — ABNORMAL LOW (ref 41.0–53.0)
LARGE UNSTAINED CELLS: 1 % (ref 0–4)
LYMPHOCYTES RELATIVE PERCENT: 2.5 %
MEAN CORPUSCULAR HEMOGLOBIN: 25.9 pg — ABNORMAL LOW (ref 26.0–34.0)
MEAN CORPUSCULAR VOLUME: 82.8 fL (ref 80.0–100.0)
MEAN PLATELET VOLUME: 7.2 fL (ref 7.0–10.0)
MONOCYTES ABSOLUTE COUNT: 0.3 10*9/L (ref 0.2–0.8)
MONOCYTES RELATIVE PERCENT: 5.2 %
NEUTROPHILS ABSOLUTE COUNT: 4.3 10*9/L (ref 2.0–7.5)
NEUTROPHILS RELATIVE PERCENT: 88.4 %
PLATELET COUNT: 263 10*9/L (ref 150–440)
RED BLOOD CELL COUNT: 4.15 10*12/L — ABNORMAL LOW (ref 4.50–5.90)
RED CELL DISTRIBUTION WIDTH: 15.6 % — ABNORMAL HIGH (ref 12.0–15.0)
WBC ADJUSTED: 4.8 10*9/L (ref 4.5–11.0)

## 2018-08-26 LAB — TACROLIMUS BLOOD: Lab: 8

## 2018-08-26 LAB — SODIUM: Sodium:SCnc:Pt:Ser/Plas:Qn:: 143

## 2018-08-26 LAB — RED CELL DISTRIBUTION WIDTH: Lab: 15.6 — ABNORMAL HIGH

## 2018-08-26 LAB — MAGNESIUM: Magnesium:MCnc:Pt:Ser/Plas:Qn:: 1.7

## 2018-08-26 MED FILL — AMLODIPINE 10 MG TABLET: 30 days supply | Qty: 30 | Fill #0 | Status: AC

## 2018-08-28 MED ORDER — ESCITALOPRAM 10 MG TABLET
ORAL_TABLET | Freq: Every day | ORAL | 3 refills | 90 days | Status: CP
Start: 2018-08-28 — End: 2019-01-01
  Filled 2018-08-30: qty 90, 90d supply, fill #0

## 2018-08-30 MED FILL — ESCITALOPRAM 10 MG TABLET: 90 days supply | Qty: 90 | Fill #0 | Status: AC

## 2018-09-02 ENCOUNTER — Encounter: Admit: 2018-09-02 | Discharge: 2018-09-03 | Payer: BLUE CROSS/BLUE SHIELD

## 2018-09-02 DIAGNOSIS — Z940 Kidney transplant status: Secondary | ICD-10-CM

## 2018-09-02 DIAGNOSIS — Z79899 Other long term (current) drug therapy: Secondary | ICD-10-CM

## 2018-09-02 LAB — CBC W/ AUTO DIFF
BASOPHILS ABSOLUTE COUNT: 0 10*9/L (ref 0.0–0.1)
BASOPHILS RELATIVE PERCENT: 0.1 %
EOSINOPHILS ABSOLUTE COUNT: 0.1 10*9/L (ref 0.0–0.4)
EOSINOPHILS RELATIVE PERCENT: 2.7 %
HEMATOCRIT: 35.4 % — ABNORMAL LOW (ref 41.0–53.0)
LARGE UNSTAINED CELLS: 1 % (ref 0–4)
LYMPHOCYTES ABSOLUTE COUNT: 0.1 10*9/L — ABNORMAL LOW (ref 1.5–5.0)
LYMPHOCYTES RELATIVE PERCENT: 2.8 %
MEAN CORPUSCULAR HEMOGLOBIN CONC: 30.3 g/dL — ABNORMAL LOW (ref 31.0–37.0)
MEAN CORPUSCULAR HEMOGLOBIN: 25.3 pg — ABNORMAL LOW (ref 26.0–34.0)
MEAN CORPUSCULAR VOLUME: 83.6 fL (ref 80.0–100.0)
MEAN PLATELET VOLUME: 8.1 fL (ref 7.0–10.0)
MONOCYTES ABSOLUTE COUNT: 0.2 10*9/L (ref 0.2–0.8)
MONOCYTES RELATIVE PERCENT: 4.5 %
NEUTROPHILS ABSOLUTE COUNT: 3.6 10*9/L (ref 2.0–7.5)
NEUTROPHILS RELATIVE PERCENT: 89 %
PLATELET COUNT: 271 10*9/L (ref 150–440)
RED BLOOD CELL COUNT: 4.24 10*12/L — ABNORMAL LOW (ref 4.50–5.90)
RED CELL DISTRIBUTION WIDTH: 15.3 % — ABNORMAL HIGH (ref 12.0–15.0)
WBC ADJUSTED: 4 10*9/L — ABNORMAL LOW (ref 4.5–11.0)

## 2018-09-02 LAB — RENAL FUNCTION PANEL
ALBUMIN: 4.3 g/dL (ref 3.5–5.0)
ANION GAP: 9 mmol/L (ref 7–15)
BUN / CREAT RATIO: 12
CALCIUM: 9.7 mg/dL (ref 8.5–10.2)
CHLORIDE: 113 mmol/L — ABNORMAL HIGH (ref 98–107)
CO2: 21 mmol/L — ABNORMAL LOW (ref 22.0–30.0)
CREATININE: 1.8 mg/dL — ABNORMAL HIGH (ref 0.70–1.30)
EGFR CKD-EPI AA MALE: 52 mL/min/{1.73_m2} — ABNORMAL LOW (ref >=60)
EGFR CKD-EPI NON-AA MALE: 45 mL/min/{1.73_m2} — ABNORMAL LOW (ref >=60)
PHOSPHORUS: 2.4 mg/dL — ABNORMAL LOW (ref 2.9–4.7)
POTASSIUM: 4.8 mmol/L (ref 3.5–5.0)
SODIUM: 143 mmol/L (ref 135–145)

## 2018-09-02 LAB — CO2: Carbon dioxide:SCnc:Pt:Ser/Plas:Qn:: 21 — ABNORMAL LOW

## 2018-09-02 LAB — TACROLIMUS BLOOD: Lab: 8.3

## 2018-09-02 LAB — MAGNESIUM: Magnesium:MCnc:Pt:Ser/Plas:Qn:: 1.8

## 2018-09-02 LAB — LYMPHOCYTES ABSOLUTE COUNT: Lab: 0.1 — ABNORMAL LOW

## 2018-09-04 ENCOUNTER — Telehealth: Admit: 2018-09-04 | Discharge: 2018-09-05 | Payer: BLUE CROSS/BLUE SHIELD | Attending: Nephrology | Primary: Nephrology

## 2018-09-04 LAB — FERRITIN: Ferritin:MCnc:Pt:Ser/Plas:Qn:: 12.9 — ABNORMAL LOW

## 2018-09-04 LAB — THYROID STIMULATING HORMONE: Thyrotropin:ACnc:Pt:Ser/Plas:Qn:: 0.803

## 2018-09-04 LAB — PRO-BNP: Natriuretic peptide.B prohormone N-Terminal:MCnc:Pt:Ser/Plas:Qn:: 120 — ABNORMAL HIGH

## 2018-09-04 NOTE — Unmapped (Signed)
I spent 15 minutes on the phone with the patient before his telemedicine visit with Dr. Nestor Lewandowsky.  I reviewed and updated his medications.  He has not missed any doses and has had no trouble getting his medications.  BP is running 130's/80's and HR is 70-76 range. He denies any cough, fever, N/V/D or swelling. He is still a little short winded when he does things.  His phosphorus is running 2.4 range and I told him to add high phosphorus foods and drinks to his diet so he will add a caffeine free coke once a day.  His Hgb is running 10.7 and will stop his valcyte on 09/25/18.  He will keep his labs weekly x 4 more weeks and then he will call me to discuss to go every 2 weeks.  He wants to return to work on 8/24.  I will send him a letter for that as well.

## 2018-09-04 NOTE — Unmapped (Signed)
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  Urine analysis: negative protein/ 3 WBC/ 7 RBC (08/07/18)  Urine protein/creatinine ratio: 0.100.  DSA: not checked    Last CMV checked: not detected - 08/07/18  Last BKV checked: no decoy cells - 08/07/18    Prophylaxis -   - Bactrim 80 mg TIW (MWF) (until 12/25/18)  - Valcyte 450 mg daily (until 09/24/18)    2) Immunosuppression: Prograf 3 mg BID / Myfortic 540 mg BID  - Last Prograf level 8.3 on 09/02/18 and Last dose of Prograf: 9PM  - Goal of 12 hour Prograf trough: 7-9  - Changes in Immunosuppression - None today.  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    9) SOB - unsure of etiology - atelectasis ?  - Chest X ray, BNP, TSH.  - If above normal then may consider ECHO.    Follow-up: Virtual visit in 4 weeks     I spent 8 minutes on the real-time audio and video with the patient. I spent an additional 10 minutes on pre- and post-visit activities.     The patient was physically located in [place] or a state in which I am permitted to provide care. The patient and/or parent/guardian understood that s/he may incur co-pays and cost sharing, and agreed to the telemedicine visit. The visit was reasonable and appropriate under the circumstances given the patient's presentation at the time.    The patient and/or parent/guardian has been advised of the potential risks and limitations of this mode of treatment (including, but not limited to, the absence of in-person examination) and has agreed to be treated using telemedicine. The patient's/patient's family's questions regarding telemedicine have been answered.     If the visit was completed in an ambulatory setting, the patient and/or parent/guardian has also been advised to contact their provider???s office for worsening conditions, and seek emergency medical treatment and/or call 911 if the patient deems either necessary.        History of Presenting Illness:     This is the patient's first visit in transplant clinic post transplant. He was admitted 6/2-06/28/18 for living donor renal transplant (from his mother, 25 y.o.), which he received on 06/25/18. Postoperative course was uncomplicated with no delayed graft function.    Patient reports initial diagnosis of polycystic kidney disease in early 2000. He had a ruptured appendix in late '99 vs early '00, and in the process of that workup,  was diagnosed with PKD. He reports complications of ruptured cysts every 1-2 years, more recently 2-3 times per year; most recent occurrence was the week prior to his transplant surgery. He experiences pain and hematuria for 3 days to 2 weeks leading up to rupture, with instant relief once the cyst ruptures; may experience some residual bleeding after rupture. He denies history of cysts in the pancreas or liver. Also denies any history of UTI. Reports family history of PKD in his father, sister, and sister's daughter.    Reports HTN control has been inconsistent over the last 20 years. He has been better controlled on current regimen of amlodipine and carvedilol.    He reports history of ibuprofen use for headaches, previously occurring 3 times per week. Headaches are currently resolved. He believes they were occurring back when his BPs were labile.    Patient c/o SOB - 1-2 blocks present since transplant. Denies chest pain, fever, chills, nausea, vomiting, diarrhea, abdominal pain or pain over allograft site. Appetite good.     Last dose of Prograf at 9PM   Diabetes: No   HTN: Yes    Controlled:Yes   CAD/Heartfailure: No   Adherence      With Medication: yes    With Follow up: yes    Functional Status: Independent    Review of Systems:     Fever or chills: negative   Sore throat: negative   Fatigue/malaise: negative   Weight loss or gain: negative   New skin rash/lump or bump: negative   Problems with teeth/gums: negative   Chest pain: negative   Cough or shortness of breath: present.  Swelling: negative   Abdominal pain/heartburn/nausea/vomiting or diarrhea: negative   Pain or bleeding when urinating: negative   Twitching/numbness or weakness: negative     Physical Exam:     Not done    Renal Transplant History:    Race: Caucasian   Age of recipient (at time of transplant): 43 y.o.   Cause of kidney disease: ADPKD   Native biopsy: No    Date of transplant: 06/25/2018   Type of transplant: LR    - Donor creatinine: N/A    - Any co morbidities: None    - Infection in donor: none    - Ischemia time: 2h 74m    - Crossmatch: negative    - Donor kidney biopsy (06/25/18): moderate arteriosclerosis     Induction: Campath - Alemtuzumab   Maintenance IS at the time of transplant: tacrolimus, Myfortic   DGF: No    Allergies:   Allergies   Allergen Reactions   ??? Benazepril Rash   ??? Clarithromycin Rash   ??? Penicillins Rash        Current Medications:   Current Outpatient Medications   Medication Sig Dispense Refill   ??? acetaminophen (TYLENOL) 500 MG tablet Take 2 tablets (1,000 mg total) by mouth every six (6) hours as needed for pain. 100 tablet 0   ??? amLODIPine (NORVASC) 10 MG tablet Take 1 tablet (10 mg total) by mouth daily. 30 tablet 11   ??? aspirin (ECOTRIN) 81 MG tablet Take 1 tablet (81 mg total) by mouth daily. 30 tablet 11   ??? carvediloL (COREG) 6.25 MG tablet Take 1 tablet (6.25 mg total) by mouth Two (2) times a day. 180 tablet 3   ??? cholecalciferol, vitamin D3, 50 mcg (2,000 unit) tablet Take 1 tablet (2,000 Units total) by mouth daily.     ??? escitalopram oxalate (LEXAPRO) 10  MG tablet Take 1 tablet (10 mg total) by mouth daily. 90 tablet 3   ??? mycophenolate (MYFORTIC) 180 MG EC tablet Take 3 tablets (540 mg total) by mouth Two (2) times a day. 540 tablet 3   ??? sodium bicarbonate 650 mg tablet Take 1 and 1/2 tablets (975 mg total) by mouth two (2) times a day. 90 tablet 11   ??? sulfamethoxazole-trimethoprim (BACTRIM) 400-80 mg per tablet Take 1 tablet (80 mg of trimethoprim total) by mouth Every Monday, Wednesday, and Friday. 12 tablet 5   ??? tacrolimus (PROGRAF) 1 MG capsule Take 3 capsules in the morning and take 2 capsules at night 450 capsule 3   ??? valGANciclovir (VALCYTE) 450 mg tablet Take 1 tablet (450 mg total) by mouth daily. 30 tablet 2     No current facility-administered medications for this visit.        Past Medical History:   Past Medical History:   Diagnosis Date   ??? ADPKD (autosomal dominant polycystic kidney disease)    ??? Hypertension    ??? Kidney stone    ??? Polycystic kidney disease         Laboratory studies:     Recent Results (from the past 170 hour(s))   Tacrolimus level    Collection Time: 09/02/18  9:48 AM   Result Value Ref Range    Tacrolimus, Timed 8.3 ng/mL   Magnesium Level    Collection Time: 09/02/18  9:48 AM   Result Value Ref Range    Magnesium 1.8 1.6 - 2.2 mg/dL   Renal Function Panel    Collection Time: 09/02/18  9:48 AM   Result Value Ref Range    Sodium 143 135 - 145 mmol/L    Potassium 4.8 3.5 - 5.0 mmol/L    Chloride 113 (H) 98 - 107 mmol/L    CO2 21.0 (L) 22.0 - 30.0 mmol/L    Anion Gap 9 7 - 15 mmol/L    BUN 21 7 - 21 mg/dL    Creatinine 2.95 (H) 0.70 - 1.30 mg/dL    BUN/Creatinine Ratio 12     EGFR CKD-EPI Non-African American, Male 45 (L) >=60 mL/min/1.48m2    EGFR CKD-EPI African American, Male 52 (L) >=60 mL/min/1.68m2    Glucose 105 70 - 179 mg/dL    Calcium 9.7 8.5 - 62.1 mg/dL    Phosphorus 2.4 (L) 2.9 - 4.7 mg/dL    Albumin 4.3 3.5 - 5.0 g/dL   CBC w/ Differential    Collection Time: 09/02/18  9:48 AM   Result Value Ref Range    WBC 4.0 (L) 4.5 - 11.0 10*9/L    RBC 4.24 (L) 4.50 - 5.90 10*12/L    HGB 10.7 (L) 13.5 - 17.5 g/dL    HCT 30.8 (L) 65.7 - 53.0 %    MCV 83.6 80.0 - 100.0 fL    MCH 25.3 (L) 26.0 - 34.0 pg    MCHC 30.3 (L) 31.0 - 37.0 g/dL    RDW 84.6 (H) 96.2 - 15.0 %    MPV 8.1 7.0 - 10.0 fL    Platelet 271 150 - 440 10*9/L    Neutrophils % 89.0 %    Lymphocytes % 2.8 %    Monocytes % 4.5 %    Eosinophils % 2.7 %    Basophils % 0.1 %    Neutrophil Left Shift 1+ (A) Not Present    Absolute Neutrophils 3.6 2.0 - 7.5 10*9/L    Absolute Lymphocytes 0.1 (  L) 1.5 - 5.0 10*9/L    Absolute Monocytes 0.2 0.2 - 0.8 10*9/L    Absolute Eosinophils 0.1 0.0 - 0.4 10*9/L    Absolute Basophils 0.0 0.0 - 0.1 10*9/L    Large Unstained Cells 1 0 - 4 %    Hypochromasia Moderate (A) Not Present       Leeroy Bock, MD

## 2018-09-05 NOTE — Unmapped (Signed)
Labs of low iron reviewed with Dr. Nestor Lewandowsky, ordered 510mg  IV x 1, pt will come to [facility] on 8/14 at 1230.

## 2018-09-06 ENCOUNTER — Encounter: Admit: 2018-09-06 | Discharge: 2018-09-07 | Payer: BLUE CROSS/BLUE SHIELD

## 2018-09-06 DIAGNOSIS — D631 Anemia in chronic kidney disease: Secondary | ICD-10-CM

## 2018-09-06 DIAGNOSIS — N189 Chronic kidney disease, unspecified: Principal | ICD-10-CM

## 2018-09-06 NOTE — Unmapped (Signed)
Pt here for infusion of Feraheme. Pt has never received medication prior to this infusion. Reviewed with pt s/sx of reactions to report and instructed to seek medical treatment if they present.   1247- Premedication given: Tylenol 650mg . Benadryl 25mg  not given due to pt driving.  2956- PIV inserted to right forearm using 24G jelco angiocath x2 attempt. Blood return noted and flushed with NS.    1256- NS infused at 50 ml/hr via pump.  1300- Feraheme infused at 52ml/hr as per order. Pt resting. Call bell placed.   1320- Feraheme completed. NS infusing at 50 ml/hr. IV site is free of redness and swelling. Pt to remain for 30 minutes post infusion for observation. Pt has no c/o of discomfort when asked.   1350- Observation complete. NS stopped and IV d/c-ed. IV site is free of redness and swelling. S/sx of adverse reactions reviewed again and pt instructed to seek medical treatment if they arise. Pt left clinic in stable condition.

## 2018-09-09 ENCOUNTER — Encounter: Admit: 2018-09-09 | Discharge: 2018-09-10 | Payer: BLUE CROSS/BLUE SHIELD

## 2018-09-09 DIAGNOSIS — Z940 Kidney transplant status: Principal | ICD-10-CM

## 2018-09-09 DIAGNOSIS — Z79899 Other long term (current) drug therapy: Secondary | ICD-10-CM

## 2018-09-09 LAB — RENAL FUNCTION PANEL
ALBUMIN: 4.5 g/dL (ref 3.5–5.0)
ANION GAP: 7 mmol/L (ref 7–15)
BLOOD UREA NITROGEN: 19 mg/dL (ref 7–21)
BUN / CREAT RATIO: 10
CALCIUM: 9.8 mg/dL (ref 8.5–10.2)
CHLORIDE: 111 mmol/L — ABNORMAL HIGH (ref 98–107)
CO2: 23 mmol/L (ref 22.0–30.0)
CREATININE: 1.85 mg/dL — ABNORMAL HIGH (ref 0.70–1.30)
EGFR CKD-EPI AA MALE: 51 mL/min/{1.73_m2} — ABNORMAL LOW (ref >=60)
EGFR CKD-EPI NON-AA MALE: 44 mL/min/{1.73_m2} — ABNORMAL LOW (ref >=60)
PHOSPHORUS: 3 mg/dL (ref 2.9–4.7)
POTASSIUM: 4.5 mmol/L (ref 3.5–5.0)
SODIUM: 141 mmol/L (ref 135–145)

## 2018-09-09 LAB — CBC W/ AUTO DIFF
BASOPHILS ABSOLUTE COUNT: 0 10*9/L (ref 0.0–0.1)
BASOPHILS RELATIVE PERCENT: 0.2 %
EOSINOPHILS ABSOLUTE COUNT: 0.1 10*9/L (ref 0.0–0.4)
EOSINOPHILS RELATIVE PERCENT: 4.3 %
HEMATOCRIT: 34 % — ABNORMAL LOW (ref 41.0–53.0)
LARGE UNSTAINED CELLS: 2 % (ref 0–4)
LYMPHOCYTES ABSOLUTE COUNT: 0.2 10*9/L — ABNORMAL LOW (ref 1.5–5.0)
LYMPHOCYTES RELATIVE PERCENT: 6.8 %
MEAN CORPUSCULAR HEMOGLOBIN CONC: 31.8 g/dL (ref 31.0–37.0)
MEAN CORPUSCULAR HEMOGLOBIN: 26.3 pg (ref 26.0–34.0)
MEAN CORPUSCULAR VOLUME: 82.5 fL (ref 80.0–100.0)
MEAN PLATELET VOLUME: 7.2 fL (ref 7.0–10.0)
MONOCYTES ABSOLUTE COUNT: 0.2 10*9/L (ref 0.2–0.8)
MONOCYTES RELATIVE PERCENT: 7.8 %
PLATELET COUNT: 217 10*9/L (ref 150–440)
RED BLOOD CELL COUNT: 4.12 10*12/L — ABNORMAL LOW (ref 4.50–5.90)
RED CELL DISTRIBUTION WIDTH: 16 % — ABNORMAL HIGH (ref 12.0–15.0)
WBC ADJUSTED: 2.3 10*9/L — ABNORMAL LOW (ref 4.5–11.0)

## 2018-09-09 LAB — TOXIC VACUOLATION

## 2018-09-09 LAB — SLIDE REVIEW

## 2018-09-09 LAB — MEAN CORPUSCULAR VOLUME: Lab: 82.5

## 2018-09-09 LAB — MAGNESIUM: Magnesium:MCnc:Pt:Ser/Plas:Qn:: 1.6

## 2018-09-09 LAB — TACROLIMUS BLOOD: Lab: 9.1

## 2018-09-09 LAB — EGFR CKD-EPI AA MALE: Lab: 51 — ABNORMAL LOW

## 2018-09-16 ENCOUNTER — Encounter: Admit: 2018-09-16 | Discharge: 2018-09-17 | Payer: BLUE CROSS/BLUE SHIELD

## 2018-09-16 DIAGNOSIS — Z940 Kidney transplant status: Principal | ICD-10-CM

## 2018-09-16 DIAGNOSIS — Z79899 Other long term (current) drug therapy: Secondary | ICD-10-CM

## 2018-09-16 LAB — RENAL FUNCTION PANEL
ALBUMIN: 4.3 g/dL (ref 3.5–5.0)
ANION GAP: 7 mmol/L (ref 7–15)
BUN / CREAT RATIO: 9
CALCIUM: 9.4 mg/dL (ref 8.5–10.2)
CHLORIDE: 112 mmol/L — ABNORMAL HIGH (ref 98–107)
CO2: 21 mmol/L — ABNORMAL LOW (ref 22.0–30.0)
CREATININE: 1.65 mg/dL — ABNORMAL HIGH (ref 0.70–1.30)
EGFR CKD-EPI AA MALE: 58 mL/min/{1.73_m2} — ABNORMAL LOW (ref >=60)
EGFR CKD-EPI NON-AA MALE: 50 mL/min/{1.73_m2} — ABNORMAL LOW (ref >=60)
GLUCOSE RANDOM: 110 mg/dL (ref 70–179)
PHOSPHORUS: 1.9 mg/dL — ABNORMAL LOW (ref 2.9–4.7)
POTASSIUM: 4.4 mmol/L (ref 3.5–5.0)
SODIUM: 140 mmol/L (ref 135–145)

## 2018-09-16 LAB — CBC W/ AUTO DIFF
BASOPHILS ABSOLUTE COUNT: 0 10*9/L (ref 0.0–0.1)
BASOPHILS RELATIVE PERCENT: 0.9 %
EOSINOPHILS ABSOLUTE COUNT: 0.1 10*9/L (ref 0.0–0.4)
EOSINOPHILS RELATIVE PERCENT: 8 %
HEMATOCRIT: 36.1 % — ABNORMAL LOW (ref 41.0–53.0)
HEMOGLOBIN: 11.4 g/dL — ABNORMAL LOW (ref 13.5–17.5)
LARGE UNSTAINED CELLS: 4 % (ref 0–4)
LYMPHOCYTES ABSOLUTE COUNT: 0.1 10*9/L — ABNORMAL LOW (ref 1.5–5.0)
LYMPHOCYTES RELATIVE PERCENT: 8.8 %
MEAN CORPUSCULAR HEMOGLOBIN CONC: 31.4 g/dL (ref 31.0–37.0)
MEAN CORPUSCULAR HEMOGLOBIN: 26.4 pg (ref 26.0–34.0)
MEAN CORPUSCULAR VOLUME: 84.1 fL (ref 80.0–100.0)
MEAN PLATELET VOLUME: 7.2 fL (ref 7.0–10.0)
MONOCYTES ABSOLUTE COUNT: 0.2 10*9/L (ref 0.2–0.8)
MONOCYTES RELATIVE PERCENT: 11.4 %
NEUTROPHILS ABSOLUTE COUNT: 0.9 10*9/L — ABNORMAL LOW (ref 2.0–7.5)
NEUTROPHILS RELATIVE PERCENT: 67.4 %
PLATELET COUNT: 227 10*9/L (ref 150–440)
RED BLOOD CELL COUNT: 4.3 10*12/L — ABNORMAL LOW (ref 4.50–5.90)

## 2018-09-16 LAB — SMEAR REVIEW

## 2018-09-16 LAB — SLIDE REVIEW

## 2018-09-16 LAB — LARGE UNSTAINED CELLS: Lab: 4

## 2018-09-16 LAB — TACROLIMUS BLOOD: Lab: 8.5

## 2018-09-16 LAB — MAGNESIUM: Magnesium:MCnc:Pt:Ser/Plas:Qn:: 1.4 — ABNORMAL LOW

## 2018-09-16 LAB — CALCIUM: Calcium:MCnc:Pt:Ser/Plas:Qn:: 9.4

## 2018-09-16 NOTE — Unmapped (Signed)
WBC from today 1.4, reviewed with Dr. Farrel Gobble, will D/C of valcyte and repeat labs.

## 2018-09-23 ENCOUNTER — Encounter: Admit: 2018-09-23 | Discharge: 2018-09-24 | Payer: BLUE CROSS/BLUE SHIELD

## 2018-09-23 DIAGNOSIS — Z79899 Other long term (current) drug therapy: Secondary | ICD-10-CM

## 2018-09-23 DIAGNOSIS — Z940 Kidney transplant status: Principal | ICD-10-CM

## 2018-09-23 LAB — SMEAR REVIEW

## 2018-09-23 LAB — CBC W/ AUTO DIFF
BASOPHILS ABSOLUTE COUNT: 0 10*9/L (ref 0.0–0.1)
BASOPHILS RELATIVE PERCENT: 0.3 %
EOSINOPHILS ABSOLUTE COUNT: 0.1 10*9/L (ref 0.0–0.4)
EOSINOPHILS RELATIVE PERCENT: 5.4 %
HEMATOCRIT: 37.2 % — ABNORMAL LOW (ref 41.0–53.0)
HEMOGLOBIN: 11.8 g/dL — ABNORMAL LOW (ref 13.5–17.5)
LARGE UNSTAINED CELLS: 3 % (ref 0–4)
LYMPHOCYTES ABSOLUTE COUNT: 0.2 10*9/L — ABNORMAL LOW (ref 1.5–5.0)
LYMPHOCYTES RELATIVE PERCENT: 8.1 %
MEAN CORPUSCULAR HEMOGLOBIN CONC: 31.7 g/dL (ref 31.0–37.0)
MEAN CORPUSCULAR HEMOGLOBIN: 26.5 pg (ref 26.0–34.0)
MEAN PLATELET VOLUME: 7.2 fL (ref 7.0–10.0)
MONOCYTES ABSOLUTE COUNT: 0.2 10*9/L (ref 0.2–0.8)
MONOCYTES RELATIVE PERCENT: 11.4 %
NEUTROPHILS ABSOLUTE COUNT: 1.4 10*9/L — ABNORMAL LOW (ref 2.0–7.5)
RED BLOOD CELL COUNT: 4.44 10*12/L — ABNORMAL LOW (ref 4.50–5.90)
RED CELL DISTRIBUTION WIDTH: 17 % — ABNORMAL HIGH (ref 12.0–15.0)
WBC ADJUSTED: 2 10*9/L — ABNORMAL LOW (ref 4.5–11.0)

## 2018-09-23 LAB — RENAL FUNCTION PANEL
ALBUMIN: 4.3 g/dL (ref 3.5–5.0)
ANION GAP: 6 mmol/L — ABNORMAL LOW (ref 7–15)
BLOOD UREA NITROGEN: 16 mg/dL (ref 7–21)
BUN / CREAT RATIO: 9
CALCIUM: 9.5 mg/dL (ref 8.5–10.2)
CHLORIDE: 112 mmol/L — ABNORMAL HIGH (ref 98–107)
CREATININE: 1.69 mg/dL — ABNORMAL HIGH (ref 0.70–1.30)
EGFR CKD-EPI AA MALE: 57 mL/min/{1.73_m2} — ABNORMAL LOW (ref >=60)
PHOSPHORUS: 2.2 mg/dL — ABNORMAL LOW (ref 2.9–4.7)
POTASSIUM: 3.9 mmol/L (ref 3.5–5.0)
SODIUM: 140 mmol/L (ref 135–145)

## 2018-09-23 LAB — BUN / CREAT RATIO: Urea nitrogen/Creatinine:MRto:Pt:Ser/Plas:Qn:: 9

## 2018-09-23 LAB — MAGNESIUM: Magnesium:MCnc:Pt:Ser/Plas:Qn:: 1.5 — ABNORMAL LOW

## 2018-09-23 LAB — TACROLIMUS BLOOD: Lab: 9.1

## 2018-09-23 LAB — LARGE UNSTAINED CELLS: Lab: 3

## 2018-09-23 MED FILL — AMLODIPINE 10 MG TABLET: ORAL | 30 days supply | Qty: 30 | Fill #1

## 2018-09-23 MED FILL — ASPIRIN 81 MG TABLET,DELAYED RELEASE: ORAL | 30 days supply | Qty: 30 | Fill #2

## 2018-09-23 MED FILL — SULFAMETHOXAZOLE 400 MG-TRIMETHOPRIM 80 MG TABLET: 28 days supply | Qty: 12 | Fill #2 | Status: AC

## 2018-09-23 MED FILL — SULFAMETHOXAZOLE 400 MG-TRIMETHOPRIM 80 MG TABLET: ORAL | 28 days supply | Qty: 12 | Fill #2

## 2018-09-23 MED FILL — AMLODIPINE 10 MG TABLET: 30 days supply | Qty: 30 | Fill #1 | Status: AC

## 2018-09-23 MED FILL — ASPIRIN 81 MG TABLET,DELAYED RELEASE: 30 days supply | Qty: 30 | Fill #2 | Status: AC

## 2018-09-23 MED FILL — SODIUM BICARBONATE 650 MG TABLET: ORAL | 30 days supply | Qty: 90 | Fill #1

## 2018-09-23 MED FILL — SODIUM BICARBONATE 650 MG TABLET: 30 days supply | Qty: 90 | Fill #1 | Status: AC

## 2018-10-01 ENCOUNTER — Encounter: Admit: 2018-10-01 | Discharge: 2018-10-02 | Payer: BLUE CROSS/BLUE SHIELD

## 2018-10-01 DIAGNOSIS — Z79899 Other long term (current) drug therapy: Secondary | ICD-10-CM

## 2018-10-01 DIAGNOSIS — Z940 Kidney transplant status: Secondary | ICD-10-CM

## 2018-10-01 LAB — CHLORIDE: Chloride:SCnc:Pt:Ser/Plas:Qn:: 112 — ABNORMAL HIGH

## 2018-10-01 LAB — CBC W/ AUTO DIFF
BASOPHILS ABSOLUTE COUNT: 0 10*9/L (ref 0.0–0.1)
BASOPHILS RELATIVE PERCENT: 0.4 %
EOSINOPHILS ABSOLUTE COUNT: 0.1 10*9/L (ref 0.0–0.4)
HEMATOCRIT: 38 % — ABNORMAL LOW (ref 41.0–53.0)
HEMOGLOBIN: 11.8 g/dL — ABNORMAL LOW (ref 13.5–17.5)
LARGE UNSTAINED CELLS: 4 % (ref 0–4)
LYMPHOCYTES ABSOLUTE COUNT: 0.2 10*9/L — ABNORMAL LOW (ref 1.5–5.0)
LYMPHOCYTES RELATIVE PERCENT: 5.7 %
MEAN CORPUSCULAR HEMOGLOBIN CONC: 31.1 g/dL (ref 31.0–37.0)
MEAN CORPUSCULAR HEMOGLOBIN: 26 pg (ref 26.0–34.0)
MEAN PLATELET VOLUME: 8 fL (ref 7.0–10.0)
MONOCYTES ABSOLUTE COUNT: 0.3 10*9/L (ref 0.2–0.8)
MONOCYTES RELATIVE PERCENT: 9.3 %
NEUTROPHILS ABSOLUTE COUNT: 2.3 10*9/L (ref 2.0–7.5)
NEUTROPHILS RELATIVE PERCENT: 76.5 %
PLATELET COUNT: 226 10*9/L (ref 150–440)
RED BLOOD CELL COUNT: 4.55 10*12/L (ref 4.50–5.90)
RED CELL DISTRIBUTION WIDTH: 16.1 % — ABNORMAL HIGH (ref 12.0–15.0)
WBC ADJUSTED: 3 10*9/L — ABNORMAL LOW (ref 4.5–11.0)

## 2018-10-01 LAB — RENAL FUNCTION PANEL
ALBUMIN: 4.2 g/dL (ref 3.5–5.0)
ANION GAP: 9 mmol/L (ref 7–15)
BUN / CREAT RATIO: 11
CALCIUM: 9.5 mg/dL (ref 8.5–10.2)
CHLORIDE: 112 mmol/L — ABNORMAL HIGH (ref 98–107)
CO2: 24 mmol/L (ref 22.0–30.0)
CREATININE: 1.86 mg/dL — ABNORMAL HIGH (ref 0.70–1.30)
EGFR CKD-EPI AA MALE: 50 mL/min/{1.73_m2} — ABNORMAL LOW (ref >=60)
EGFR CKD-EPI NON-AA MALE: 44 mL/min/{1.73_m2} — ABNORMAL LOW (ref >=60)
GLUCOSE RANDOM: 110 mg/dL (ref 70–179)
PHOSPHORUS: 2.6 mg/dL — ABNORMAL LOW (ref 2.9–4.7)
POTASSIUM: 4.8 mmol/L (ref 3.5–5.0)
SODIUM: 145 mmol/L (ref 135–145)

## 2018-10-01 LAB — MEAN CORPUSCULAR HEMOGLOBIN: Lab: 26

## 2018-10-01 LAB — MAGNESIUM: Magnesium:MCnc:Pt:Ser/Plas:Qn:: 1.6

## 2018-10-01 LAB — DOHLE BODIES

## 2018-10-01 LAB — SLIDE REVIEW

## 2018-10-01 LAB — TACROLIMUS BLOOD: Lab: 8

## 2018-10-02 DIAGNOSIS — Z79899 Other long term (current) drug therapy: Secondary | ICD-10-CM

## 2018-10-02 DIAGNOSIS — Z940 Kidney transplant status: Secondary | ICD-10-CM

## 2018-10-03 ENCOUNTER — Encounter: Admit: 2018-10-03 | Discharge: 2018-10-04 | Payer: BLUE CROSS/BLUE SHIELD | Attending: Nephrology | Primary: Nephrology

## 2018-10-03 NOTE — Unmapped (Signed)
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  DSA: not checked    Last CMV checked: not detected - 08/07/18  Last BKV checked: no decoy cells - 08/07/18    Prophylaxis -   - Bactrim 80 mg TIW (MWF) (until 12/25/18)  - Valcyte 450 mg daily (until 09/24/18)    2) Immunosuppression: Prograf 3/2 mg BID / Myfortic 540 mg BID  - Last Prograf level 8.0 on 10/01/18 and Last dose of Prograf: 9PM  - Goal of 12 hour Prograf trough: 7-9  - Changes in Immunosuppression - decreased prograf to 2 mg BID.  - Medications side effects: Minor tremors    3) HTN - Controlled  Goal for B.P - <130/80  Changes in B.P medications - No    4) Anemia - stable, Hgb 11.8  Goal for Hemoglobin >12.0  Last Ferritin 34.5 on 06/12/17    5) MBD - Calcium 9.5 / Phosphorus 2.6 (10/01/18)  iPTH - 464.1 (07/03/18)  Last Dexa scan   - On cholecalciferol 2000u daily    6) Electrolytes: WNL  - On sodium bicarbonate 650 mg TID    7) Immunizations:  Influenza (inactivated only): declines  Pneumococcal vaccination (inactivated only): has not received    8) Cancer screening:  Colonoscopy - due at 43 y.o.    9) Fatigue / SOB - unsure of etiology -   - CXR negative on 09/09/18  - TSH normal (0.803), Pro-BNP elevated (120.0) on 09/02/18  - Medication related?      Follow-up: Virtual visit in 4 weeks     I spent 6 minutes on the real-time audio and video with the patient. I spent an additional 8 minutes on pre- and post-visit activities.     The patient was physically located in [place] or a state in which I am permitted to provide care. The patient and/or parent/guardian understood that s/he may incur co-pays and cost sharing, and agreed to the telemedicine visit. The visit was reasonable and appropriate under the circumstances given the patient's presentation at the time.    The patient and/or parent/guardian has been advised of the potential risks and limitations of this mode of treatment (including, but not limited to, the absence of in-person examination) and has agreed to be treated using telemedicine. The patient's/patient's family's questions regarding telemedicine have been answered.     If the visit was completed in an ambulatory setting, the patient and/or parent/guardian has also been advised to contact their provider???s office for worsening conditions, and seek emergency medical treatment and/or call 911 if the patient deems either necessary.      History of Presenting Illness:     Since patient's last visit in the transplant clinic - patient has been doing well in terms of transplant, taking transplant medications regularly, no episodes of rejection and no side effects of medications.    Since last seen, he received Feraheme infusion on 09/06/18, which he tolerated well.    His visit today is conducted via telephone/video due to the  ongoing COVID-19 pandemic.  Still c/o fatigue and SOB - it is slightly better after IV iron infusion. No tremors.  Denies chest pain, fever, chills, nausea, vomiting, diarrhea, abdominal pain or pain over allograft site. Appetite good.    He was admitted 6/2-06/28/18 for living donor renal transplant (from his mother, 86 y.o.), which he received on 06/25/18. Postoperative course was uncomplicated with no delayed graft function.    Patient reports initial diagnosis of polycystic kidney disease in early 2000. He had a ruptured appendix in late '99 vs early '00, and in the process of that workup, was diagnosed with PKD. He reports complications of ruptured cysts every 1-2 years, more recently 2-3 times per year; most recent occurrence was the week prior to his transplant surgery. He experiences pain and hematuria for 3 days to 2 weeks leading up to rupture, with instant relief once the cyst ruptures; may experience some residual bleeding after rupture. He denies history of cysts in the pancreas or liver. Also denies any history of UTI. Reports family history of PKD in his father, sister, and sister's daughter.    Reports HTN control has been inconsistent over the last 20 years. He has been better controlled on current regimen of amlodipine and carvedilol.    He reports history of ibuprofen use for headaches, previously occurring 3 times per week. Headaches are currently resolved. He believes they were occurring back when his BPs were labile.          Last dose of Prograf at 9PM   Diabetes: No   HTN: Yes    Controlled:Yes   CAD/Heartfailure: No   Adherence      With Medication: yes    With Follow up: yes    Functional Status: Independent    Review of Systems:     Fever or chills: negative   Sore throat: negative   Fatigue/malaise: negative   Weight loss or gain: negative   New skin rash/lump or bump: negative   Problems with teeth/gums: negative   Chest pain: negative   Cough or shortness of breath: present.  Swelling: negative   Abdominal pain/heartburn/nausea/vomiting or diarrhea: negative   Pain or bleeding when urinating: negative   Twitching/numbness or weakness: negative     Physical Exam:     Not done    Renal Transplant History:    Race: Caucasian   Age of recipient (at time of transplant): 43 y.o.   Cause of kidney disease: ADPKD   Native biopsy: No    Date of transplant: 06/25/2018   Type of transplant: LR    - Donor creatinine: N/A    - Any co morbidities: None    - Infection in donor: none    - Ischemia time: 2h 51m    - Crossmatch: negative    - Donor kidney biopsy (06/25/18): moderate arteriosclerosis     Induction: Campath - Alemtuzumab   Maintenance IS at the time of transplant: tacrolimus, Myfortic   DGF: No Allergies:   Allergies   Allergen Reactions   ??? Benazepril Rash   ??? Clarithromycin Rash   ??? Penicillins Rash        Current Medications:   Current Outpatient Medications   Medication Sig Dispense Refill   ??? acetaminophen (TYLENOL) 500 MG tablet Take 2 tablets (1,000 mg total) by mouth every six (6) hours as needed for pain. 100 tablet 0   ??? amLODIPine (NORVASC) 10 MG tablet Take 1 tablet (10 mg total) by mouth daily. 30 tablet  11   ??? aspirin (ECOTRIN) 81 MG tablet Take 1 tablet (81 mg total) by mouth daily. 30 tablet 11   ??? carvediloL (COREG) 6.25 MG tablet Take 1 tablet (6.25 mg total) by mouth Two (2) times a day. 180 tablet 3   ??? cholecalciferol, vitamin D3, 50 mcg (2,000 unit) tablet Take 1 tablet (2,000 Units total) by mouth daily.     ??? escitalopram oxalate (LEXAPRO) 10 MG tablet Take 1 tablet (10 mg total) by mouth daily. 90 tablet 3   ??? mycophenolate (MYFORTIC) 180 MG EC tablet Take 3 tablets (540 mg total) by mouth Two (2) times a day. 540 tablet 3   ??? sodium bicarbonate 650 mg tablet Take 1 and 1/2 tablets (975 mg total) by mouth two (2) times a day. 90 tablet 11   ??? sulfamethoxazole-trimethoprim (BACTRIM) 400-80 mg per tablet Take 1 tablet (80 mg of trimethoprim total) by mouth Every Monday, Wednesday, and Friday. 12 tablet 5   ??? tacrolimus (PROGRAF) 1 MG capsule Take 3 capsules in the morning and take 2 capsules at night 450 capsule 3     No current facility-administered medications for this visit.        Past Medical History:   Past Medical History:   Diagnosis Date   ??? ADPKD (autosomal dominant polycystic kidney disease)    ??? Hypertension    ??? Kidney stone    ??? Polycystic kidney disease         Laboratory studies:     Recent Results (from the past 170 hour(s))   Tacrolimus level    Collection Time: 10/01/18  8:47 AM   Result Value Ref Range    Tacrolimus, Timed 8.0 ng/mL   Magnesium Level    Collection Time: 10/01/18  8:47 AM   Result Value Ref Range    Magnesium 1.6 1.6 - 2.2 mg/dL   Renal Function Panel    Collection Time: 10/01/18  8:47 AM   Result Value Ref Range    Sodium 145 135 - 145 mmol/L    Potassium 4.8 3.5 - 5.0 mmol/L    Chloride 112 (H) 98 - 107 mmol/L    CO2 24.0 22.0 - 30.0 mmol/L    Anion Gap 9 7 - 15 mmol/L    BUN 20 7 - 21 mg/dL    Creatinine 8.11 (H) 0.70 - 1.30 mg/dL    BUN/Creatinine Ratio 11     EGFR CKD-EPI Non-African American, Male 44 (L) >=60 mL/min/1.73m2    EGFR CKD-EPI African American, Male 50 (L) >=60 mL/min/1.48m2    Glucose 110 70 - 179 mg/dL    Calcium 9.5 8.5 - 91.4 mg/dL    Phosphorus 2.6 (L) 2.9 - 4.7 mg/dL    Albumin 4.2 3.5 - 5.0 g/dL   CBC w/ Differential    Collection Time: 10/01/18  8:47 AM   Result Value Ref Range    WBC 3.0 (L) 4.5 - 11.0 10*9/L    RBC 4.55 4.50 - 5.90 10*12/L    HGB 11.8 (L) 13.5 - 17.5 g/dL    HCT 78.2 (L) 95.6 - 53.0 %    MCV 83.5 80.0 - 100.0 fL    MCH 26.0 26.0 - 34.0 pg    MCHC 31.1 31.0 - 37.0 g/dL    RDW 21.3 (H) 08.6 - 15.0 %    MPV 8.0 7.0 - 10.0 fL    Platelet 226 150 - 440 10*9/L    Neutrophils % 76.5 %    Lymphocytes %  5.7 %    Monocytes % 9.3 %    Eosinophils % 4.5 %    Basophils % 0.4 %    Neutrophil Left Shift 2+ (A) Not Present    Absolute Neutrophils 2.3 2.0 - 7.5 10*9/L    Absolute Lymphocytes 0.2 (L) 1.5 - 5.0 10*9/L    Absolute Monocytes 0.3 0.2 - 0.8 10*9/L    Absolute Eosinophils 0.1 0.0 - 0.4 10*9/L    Absolute Basophils 0.0 0.0 - 0.1 10*9/L    Large Unstained Cells 4 0 - 4 %    Anisocytosis Slight (A) Not Present    Hypochromasia Moderate (A) Not Present   Morphology Review    Collection Time: 10/01/18  8:47 AM   Result Value Ref Range    Smear Review Comments See Comment (A) Undefined    Dohle Bodies Present (A) Not Present    Toxic Granulation Present (A) Not Present       Leeroy Bock, MD

## 2018-10-03 NOTE — Unmapped (Signed)
I spent 15 minutes on the phone with the patient before his telemedicine visit with Dr. Nestor Lewandowsky on 10/03/18.  I reviewed and updated his medications.  He is still feeling fatigue and short winded, not as bad but still everyday even since getting his iron infusion.  He will come to clinic on Friday 10/04/18 to get his flu shot.  He denies any N/V/D, swelling, fever or cough.

## 2018-10-04 ENCOUNTER — Encounter: Admit: 2018-10-04 | Discharge: 2018-10-04 | Payer: BLUE CROSS/BLUE SHIELD

## 2018-10-04 DIAGNOSIS — Z23 Encounter for immunization: Secondary | ICD-10-CM

## 2018-10-04 NOTE — Unmapped (Signed)
Pt received flu shot in right arm

## 2018-10-07 DIAGNOSIS — Z940 Kidney transplant status: Secondary | ICD-10-CM

## 2018-10-07 MED ORDER — TACROLIMUS 1 MG CAPSULE
ORAL_CAPSULE | 3 refills | 0 days
Start: 2018-10-07 — End: ?

## 2018-10-09 ENCOUNTER — Encounter: Admit: 2018-10-09 | Discharge: 2018-10-10 | Payer: BLUE CROSS/BLUE SHIELD

## 2018-10-09 DIAGNOSIS — Z940 Kidney transplant status: Secondary | ICD-10-CM

## 2018-10-09 DIAGNOSIS — Z79899 Other long term (current) drug therapy: Secondary | ICD-10-CM

## 2018-10-09 LAB — RENAL FUNCTION PANEL
ALBUMIN: 4.3 g/dL (ref 3.5–5.0)
ANION GAP: 9 mmol/L (ref 7–15)
BLOOD UREA NITROGEN: 20 mg/dL (ref 7–21)
BUN / CREAT RATIO: 11
CALCIUM: 9.6 mg/dL (ref 8.5–10.2)
CHLORIDE: 112 mmol/L — ABNORMAL HIGH (ref 98–107)
CO2: 23 mmol/L (ref 22.0–30.0)
CREATININE: 1.76 mg/dL — ABNORMAL HIGH (ref 0.70–1.30)
EGFR CKD-EPI AA MALE: 54 mL/min/{1.73_m2} — ABNORMAL LOW (ref >=60)
GLUCOSE RANDOM: 105 mg/dL (ref 70–179)
PHOSPHORUS: 2.6 mg/dL — ABNORMAL LOW (ref 2.9–4.7)
POTASSIUM: 5 mmol/L (ref 3.5–5.0)
SODIUM: 144 mmol/L (ref 135–145)

## 2018-10-09 LAB — CBC W/ AUTO DIFF
BASOPHILS ABSOLUTE COUNT: 0 10*9/L (ref 0.0–0.1)
BASOPHILS RELATIVE PERCENT: 0.4 %
EOSINOPHILS ABSOLUTE COUNT: 0.1 10*9/L (ref 0.0–0.4)
EOSINOPHILS RELATIVE PERCENT: 4.4 %
HEMATOCRIT: 37.7 % — ABNORMAL LOW (ref 41.0–53.0)
HEMOGLOBIN: 12.3 g/dL — ABNORMAL LOW (ref 13.5–17.5)
LARGE UNSTAINED CELLS: 3 % (ref 0–4)
LYMPHOCYTES ABSOLUTE COUNT: 0.2 10*9/L — ABNORMAL LOW (ref 1.5–5.0)
LYMPHOCYTES RELATIVE PERCENT: 6.7 %
MEAN CORPUSCULAR HEMOGLOBIN CONC: 32.7 g/dL (ref 31.0–37.0)
MEAN CORPUSCULAR HEMOGLOBIN: 26.8 pg (ref 26.0–34.0)
MEAN CORPUSCULAR VOLUME: 81.8 fL (ref 80.0–100.0)
MEAN PLATELET VOLUME: 7.3 fL (ref 7.0–10.0)
MONOCYTES ABSOLUTE COUNT: 0.3 10*9/L (ref 0.2–0.8)
MONOCYTES RELATIVE PERCENT: 8.3 %
NEUTROPHILS ABSOLUTE COUNT: 2.3 10*9/L (ref 2.0–7.5)
NEUTROPHILS RELATIVE PERCENT: 77.3 %
PLATELET COUNT: 249 10*9/L (ref 150–440)
RED BLOOD CELL COUNT: 4.61 10*12/L (ref 4.50–5.90)
RED CELL DISTRIBUTION WIDTH: 15.6 % — ABNORMAL HIGH (ref 12.0–15.0)

## 2018-10-09 LAB — TACROLIMUS BLOOD: Lab: 6.7

## 2018-10-09 LAB — MAGNESIUM: Magnesium:MCnc:Pt:Ser/Plas:Qn:: 1.6

## 2018-10-09 LAB — SMEAR REVIEW

## 2018-10-09 LAB — MEAN PLATELET VOLUME: Lab: 7.3

## 2018-10-09 LAB — POTASSIUM: Potassium:SCnc:Pt:Ser/Plas:Qn:: 5

## 2018-10-10 NOTE — Unmapped (Signed)
Closing old encounter for specialty drug refill call attempt. Encounter recorded at a later date.

## 2018-10-12 LAB — CMV DNA, QUANTITATIVE, PCR: CMV VIRAL LD: NOT DETECTED

## 2018-10-12 LAB — CMV QUANT LOG10: Lab: 0

## 2018-10-15 ENCOUNTER — Non-Acute Institutional Stay: Admit: 2018-10-15 | Discharge: 2018-10-16 | Payer: BLUE CROSS/BLUE SHIELD

## 2018-10-15 DIAGNOSIS — Z79899 Other long term (current) drug therapy: Secondary | ICD-10-CM

## 2018-10-15 DIAGNOSIS — Z940 Kidney transplant status: Secondary | ICD-10-CM

## 2018-10-15 LAB — CBC W/ AUTO DIFF
BASOPHILS ABSOLUTE COUNT: 0 10*9/L (ref 0.0–0.1)
BASOPHILS RELATIVE PERCENT: 0.3 %
EOSINOPHILS ABSOLUTE COUNT: 0.2 10*9/L (ref 0.0–0.4)
EOSINOPHILS RELATIVE PERCENT: 4.7 %
HEMATOCRIT: 38.2 % — ABNORMAL LOW (ref 41.0–53.0)
HEMOGLOBIN: 12.1 g/dL — ABNORMAL LOW (ref 13.5–17.5)
LARGE UNSTAINED CELLS: 3 % (ref 0–4)
LYMPHOCYTES ABSOLUTE COUNT: 0.2 10*9/L — ABNORMAL LOW (ref 1.5–5.0)
LYMPHOCYTES RELATIVE PERCENT: 5.9 %
MEAN CORPUSCULAR HEMOGLOBIN CONC: 31.7 g/dL (ref 31.0–37.0)
MEAN CORPUSCULAR HEMOGLOBIN: 25.7 pg — ABNORMAL LOW (ref 26.0–34.0)
MEAN CORPUSCULAR VOLUME: 81.1 fL (ref 80.0–100.0)
MEAN PLATELET VOLUME: 6.9 fL — ABNORMAL LOW (ref 7.0–10.0)
MONOCYTES ABSOLUTE COUNT: 0.3 10*9/L (ref 0.2–0.8)
MONOCYTES RELATIVE PERCENT: 7.7 %
NEUTROPHILS RELATIVE PERCENT: 78.1 %
PLATELET COUNT: 238 10*9/L (ref 150–440)
RED BLOOD CELL COUNT: 4.71 10*12/L (ref 4.50–5.90)
RED CELL DISTRIBUTION WIDTH: 15.4 % — ABNORMAL HIGH (ref 12.0–15.0)

## 2018-10-15 LAB — RENAL FUNCTION PANEL
ALBUMIN: 4.7 g/dL (ref 3.5–5.0)
ANION GAP: 11 mmol/L (ref 7–15)
BLOOD UREA NITROGEN: 24 mg/dL — ABNORMAL HIGH (ref 7–21)
BUN / CREAT RATIO: 12
CHLORIDE: 110 mmol/L — ABNORMAL HIGH (ref 98–107)
CO2: 24 mmol/L (ref 22.0–30.0)
CREATININE: 2 mg/dL — ABNORMAL HIGH (ref 0.70–1.30)
EGFR CKD-EPI NON-AA MALE: 40 mL/min/{1.73_m2} — ABNORMAL LOW (ref >=60)
PHOSPHORUS: 3 mg/dL (ref 2.9–4.7)
POTASSIUM: 5.6 mmol/L — ABNORMAL HIGH (ref 3.5–5.0)
SODIUM: 145 mmol/L (ref 135–145)

## 2018-10-15 LAB — TACROLIMUS BLOOD: Lab: 6.5

## 2018-10-15 LAB — MEAN CORPUSCULAR HEMOGLOBIN CONC: Lab: 31.7

## 2018-10-15 LAB — TACROLIMUS LEVEL: TACROLIMUS BLOOD: 6.5 ng/mL

## 2018-10-15 LAB — MAGNESIUM: Magnesium:MCnc:Pt:Ser/Plas:Qn:: 1.8

## 2018-10-15 LAB — CHLORIDE: Chloride:SCnc:Pt:Ser/Plas:Qn:: 110 — ABNORMAL HIGH

## 2018-10-15 NOTE — Unmapped (Signed)
I called pt to have him repeat his labs on Thursday and hydrate well these next 2 days.  He otherwise feels ok.

## 2018-10-16 MED FILL — SODIUM BICARBONATE 650 MG TABLET: 30 days supply | Qty: 90 | Fill #2 | Status: AC

## 2018-10-16 MED FILL — ASPIRIN 81 MG TABLET,DELAYED RELEASE: 30 days supply | Qty: 30 | Fill #3 | Status: AC

## 2018-10-16 MED FILL — SULFAMETHOXAZOLE 400 MG-TRIMETHOPRIM 80 MG TABLET: 28 days supply | Qty: 12 | Fill #3 | Status: AC

## 2018-10-16 MED FILL — AMLODIPINE 10 MG TABLET: 30 days supply | Qty: 30 | Fill #2 | Status: AC

## 2018-10-16 MED FILL — ASPIRIN 81 MG TABLET,DELAYED RELEASE: ORAL | 30 days supply | Qty: 30 | Fill #3

## 2018-10-16 MED FILL — SULFAMETHOXAZOLE 400 MG-TRIMETHOPRIM 80 MG TABLET: ORAL | 28 days supply | Qty: 12 | Fill #3

## 2018-10-16 MED FILL — SODIUM BICARBONATE 650 MG TABLET: ORAL | 30 days supply | Qty: 90 | Fill #2

## 2018-10-16 MED FILL — AMLODIPINE 10 MG TABLET: ORAL | 30 days supply | Qty: 30 | Fill #2

## 2018-10-17 ENCOUNTER — Encounter: Admit: 2018-10-17 | Discharge: 2018-10-18 | Payer: BLUE CROSS/BLUE SHIELD

## 2018-10-17 DIAGNOSIS — Z79899 Other long term (current) drug therapy: Secondary | ICD-10-CM

## 2018-10-17 DIAGNOSIS — Z940 Kidney transplant status: Secondary | ICD-10-CM

## 2018-10-17 LAB — CBC W/ AUTO DIFF
BASOPHILS ABSOLUTE COUNT: 0 10*9/L (ref 0.0–0.1)
BASOPHILS RELATIVE PERCENT: 0.3 %
EOSINOPHILS ABSOLUTE COUNT: 0.2 10*9/L (ref 0.0–0.4)
EOSINOPHILS RELATIVE PERCENT: 5 %
HEMATOCRIT: 37.8 % — ABNORMAL LOW (ref 41.0–53.0)
HEMOGLOBIN: 12.1 g/dL — ABNORMAL LOW (ref 13.5–17.5)
LARGE UNSTAINED CELLS: 3 % (ref 0–4)
MEAN CORPUSCULAR HEMOGLOBIN CONC: 31.9 g/dL (ref 31.0–37.0)
MEAN CORPUSCULAR HEMOGLOBIN: 25.6 pg — ABNORMAL LOW (ref 26.0–34.0)
MEAN CORPUSCULAR VOLUME: 80.4 fL (ref 80.0–100.0)
MEAN PLATELET VOLUME: 7.5 fL (ref 7.0–10.0)
MONOCYTES ABSOLUTE COUNT: 0.3 10*9/L (ref 0.2–0.8)
MONOCYTES RELATIVE PERCENT: 7.9 %
NEUTROPHILS ABSOLUTE COUNT: 2.8 10*9/L (ref 2.0–7.5)
NEUTROPHILS RELATIVE PERCENT: 78.1 %
PLATELET COUNT: 222 10*9/L (ref 150–440)
RED BLOOD CELL COUNT: 4.7 10*12/L (ref 4.50–5.90)
RED CELL DISTRIBUTION WIDTH: 14.9 % (ref 12.0–15.0)
WBC ADJUSTED: 3.5 10*9/L — ABNORMAL LOW (ref 4.5–11.0)

## 2018-10-17 LAB — RENAL FUNCTION PANEL
ALBUMIN: 4.3 g/dL (ref 3.5–5.0)
CALCIUM: 9.3 mg/dL (ref 8.5–10.2)
CHLORIDE: 110 mmol/L — ABNORMAL HIGH (ref 98–107)
CO2: 23 mmol/L (ref 22.0–30.0)
CREATININE: 1.79 mg/dL — ABNORMAL HIGH (ref 0.70–1.30)
EGFR CKD-EPI AA MALE: 52 mL/min/{1.73_m2} — ABNORMAL LOW (ref >=60)
EGFR CKD-EPI NON-AA MALE: 45 mL/min/{1.73_m2} — ABNORMAL LOW (ref >=60)
GLUCOSE RANDOM: 109 mg/dL (ref 70–179)
PHOSPHORUS: 2.7 mg/dL — ABNORMAL LOW (ref 2.9–4.7)
POTASSIUM: 4.6 mmol/L (ref 3.5–5.0)
SODIUM: 145 mmol/L (ref 135–145)

## 2018-10-17 LAB — MAGNESIUM: Magnesium:MCnc:Pt:Ser/Plas:Qn:: 1.7

## 2018-10-17 LAB — CMV DNA, QUANTITATIVE, PCR

## 2018-10-17 LAB — TACROLIMUS BLOOD: Lab: 7

## 2018-10-17 LAB — GLUCOSE RANDOM: Glucose:MCnc:Pt:Ser/Plas:Qn:: 109

## 2018-10-17 LAB — NEUTROPHIL LEFT SHIFT

## 2018-10-17 LAB — CMV QUANT: Lab: 0

## 2018-10-17 NOTE — Unmapped (Signed)
I called pt to let him know that his repeat labs were better at 1.7, he will repeat in 2 weeks as scheduled.

## 2018-10-18 LAB — CMV DNA, QUANTITATIVE, PCR

## 2018-10-18 LAB — CMV VIRAL LD: Lab: NOT DETECTED

## 2018-10-29 ENCOUNTER — Ambulatory Visit: Admit: 2018-10-29 | Discharge: 2018-10-30 | Payer: BLUE CROSS/BLUE SHIELD

## 2018-10-29 DIAGNOSIS — Z940 Kidney transplant status: Secondary | ICD-10-CM

## 2018-10-29 DIAGNOSIS — Z79899 Other long term (current) drug therapy: Secondary | ICD-10-CM

## 2018-10-29 LAB — CBC W/ AUTO DIFF
BASOPHILS ABSOLUTE COUNT: 0 10*9/L (ref 0.0–0.1)
BASOPHILS RELATIVE PERCENT: 0.1 %
EOSINOPHILS ABSOLUTE COUNT: 0.3 10*9/L (ref 0.0–0.4)
EOSINOPHILS RELATIVE PERCENT: 6.7 %
HEMATOCRIT: 39.8 % — ABNORMAL LOW (ref 41.0–53.0)
HEMOGLOBIN: 12.3 g/dL — ABNORMAL LOW (ref 13.5–17.5)
LARGE UNSTAINED CELLS: 3 % (ref 0–4)
LYMPHOCYTES ABSOLUTE COUNT: 0.3 10*9/L — ABNORMAL LOW (ref 1.5–5.0)
LYMPHOCYTES RELATIVE PERCENT: 7.7 %
MEAN CORPUSCULAR HEMOGLOBIN CONC: 31.1 g/dL (ref 31.0–37.0)
MEAN CORPUSCULAR HEMOGLOBIN: 24.9 pg — ABNORMAL LOW (ref 26.0–34.0)
MEAN CORPUSCULAR VOLUME: 80 fL (ref 80.0–100.0)
MEAN PLATELET VOLUME: 7.3 fL (ref 7.0–10.0)
MONOCYTES RELATIVE PERCENT: 7.9 %
NEUTROPHILS ABSOLUTE COUNT: 3 10*9/L (ref 2.0–7.5)
NEUTROPHILS RELATIVE PERCENT: 74.9 %
PLATELET COUNT: 232 10*9/L (ref 150–440)
RED BLOOD CELL COUNT: 4.97 10*12/L (ref 4.50–5.90)
RED CELL DISTRIBUTION WIDTH: 14.5 % (ref 12.0–15.0)

## 2018-10-29 LAB — RENAL FUNCTION PANEL
ALBUMIN: 4.3 g/dL (ref 3.5–5.0)
ANION GAP: 11 mmol/L (ref 7–15)
BLOOD UREA NITROGEN: 18 mg/dL (ref 7–21)
BUN / CREAT RATIO: 11
CALCIUM: 9.4 mg/dL (ref 8.5–10.2)
CHLORIDE: 109 mmol/L — ABNORMAL HIGH (ref 98–107)
CO2: 23 mmol/L (ref 22.0–30.0)
CREATININE: 1.69 mg/dL — ABNORMAL HIGH (ref 0.70–1.30)
EGFR CKD-EPI NON-AA MALE: 49 mL/min/{1.73_m2} — ABNORMAL LOW (ref >=60)
GLUCOSE RANDOM: 105 mg/dL (ref 70–179)
PHOSPHORUS: 2.6 mg/dL — ABNORMAL LOW (ref 2.9–4.7)
POTASSIUM: 4.6 mmol/L (ref 3.5–5.0)
SODIUM: 143 mmol/L (ref 135–145)

## 2018-10-29 LAB — MAGNESIUM: Magnesium:MCnc:Pt:Ser/Plas:Qn:: 1.6

## 2018-10-29 LAB — CALCIUM: Calcium:MCnc:Pt:Ser/Plas:Qn:: 9.4

## 2018-10-29 LAB — MEAN CORPUSCULAR HEMOGLOBIN: Lab: 24.9 — ABNORMAL LOW

## 2018-10-30 LAB — CMV DNA, QUANTITATIVE, PCR

## 2018-10-30 LAB — CMV VIRAL LD: Lab: NOT DETECTED

## 2018-10-30 LAB — TACROLIMUS BLOOD: Lab: 6.5

## 2018-11-12 MED ORDER — SODIUM BICARBONATE 650 MG TABLET: 650 mg | tablet | Freq: Two times a day (BID) | 11 refills | 30 days

## 2018-11-12 NOTE — Unmapped (Signed)
I spent 15 minutes on the phone with the patient before his telemedicine visit on 11/14/18.  I reviewed and updated his medications.  He is still feeling fatigued and short of breath at times.  It is a little better.  He has been able to increase his activities some.  He has not been checking his BP or HR recently.  He is on 6.25mg  bid of coreg.  He will check his BP and HR at different times of the day and we can see if that is contributing to how he feels and we may be able to stop the coreg or lower the dose.  His CO2 is 23 on 650mg  bid, we may try stopping it and see if he can maintain his CO2 level without it.  He denies and fever, cough, NVD or swelling.  He is getting his medications without any problems and he already got a flu shot.

## 2018-11-13 ENCOUNTER — Ambulatory Visit: Admit: 2018-11-13 | Discharge: 2018-11-14 | Payer: BLUE CROSS/BLUE SHIELD

## 2018-11-13 LAB — CBC W/ AUTO DIFF
BASOPHILS ABSOLUTE COUNT: 0 10*9/L (ref 0.0–0.1)
EOSINOPHILS ABSOLUTE COUNT: 0.2 10*9/L (ref 0.0–0.4)
EOSINOPHILS RELATIVE PERCENT: 4.6 %
HEMOGLOBIN: 12.7 g/dL — ABNORMAL LOW (ref 13.5–17.5)
LARGE UNSTAINED CELLS: 3 % (ref 0–4)
LYMPHOCYTES ABSOLUTE COUNT: 0.3 10*9/L — ABNORMAL LOW (ref 1.5–5.0)
LYMPHOCYTES RELATIVE PERCENT: 9.5 %
MEAN CORPUSCULAR HEMOGLOBIN CONC: 32 g/dL (ref 31.0–37.0)
MEAN CORPUSCULAR HEMOGLOBIN: 25.2 pg — ABNORMAL LOW (ref 26.0–34.0)
MEAN CORPUSCULAR VOLUME: 78.6 fL — ABNORMAL LOW (ref 80.0–100.0)
MEAN PLATELET VOLUME: 7.4 fL (ref 7.0–10.0)
MONOCYTES ABSOLUTE COUNT: 0.4 10*9/L (ref 0.2–0.8)
MONOCYTES RELATIVE PERCENT: 10.3 %
NEUTROPHILS ABSOLUTE COUNT: 2.6 10*9/L (ref 2.0–7.5)
NEUTROPHILS RELATIVE PERCENT: 72.4 %
PLATELET COUNT: 238 10*9/L (ref 150–440)
RED BLOOD CELL COUNT: 5.06 10*12/L (ref 4.50–5.90)
RED CELL DISTRIBUTION WIDTH: 14.8 % (ref 12.0–15.0)
WBC ADJUSTED: 3.6 10*9/L — ABNORMAL LOW (ref 4.5–11.0)

## 2018-11-13 LAB — RENAL FUNCTION PANEL
ALBUMIN: 4.4 g/dL (ref 3.5–5.0)
ANION GAP: 8 mmol/L (ref 7–15)
BLOOD UREA NITROGEN: 21 mg/dL (ref 7–21)
CALCIUM: 9.7 mg/dL (ref 8.5–10.2)
CHLORIDE: 109 mmol/L — ABNORMAL HIGH (ref 98–107)
CO2: 24 mmol/L (ref 22.0–30.0)
CREATININE: 2.07 mg/dL — ABNORMAL HIGH (ref 0.70–1.30)
EGFR CKD-EPI AA MALE: 44 mL/min/{1.73_m2} — ABNORMAL LOW (ref >=60)
EGFR CKD-EPI NON-AA MALE: 38 mL/min/{1.73_m2} — ABNORMAL LOW (ref >=60)
GLUCOSE RANDOM: 113 mg/dL (ref 70–179)
PHOSPHORUS: 3.5 mg/dL (ref 2.9–4.7)
POTASSIUM: 4.4 mmol/L (ref 3.5–5.0)
SODIUM: 141 mmol/L (ref 135–145)

## 2018-11-13 LAB — WBC ADJUSTED: Leukocytes:NCnc:Pt:Bld:Qn:: 3.6 — ABNORMAL LOW

## 2018-11-13 LAB — SMEAR REVIEW

## 2018-11-13 LAB — SLIDE REVIEW

## 2018-11-13 LAB — TACROLIMUS BLOOD: Lab: 6.8

## 2018-11-13 LAB — MAGNESIUM: Magnesium:MCnc:Pt:Ser/Plas:Qn:: 1.6

## 2018-11-13 LAB — CHLORIDE: Chloride:SCnc:Pt:Ser/Plas:Qn:: 109 — ABNORMAL HIGH

## 2018-11-14 ENCOUNTER — Encounter: Admit: 2018-11-14 | Discharge: 2018-11-15 | Payer: BLUE CROSS/BLUE SHIELD | Attending: Nephrology | Primary: Nephrology

## 2018-11-14 DIAGNOSIS — N182 Chronic kidney disease, stage 2 (mild): Principal | ICD-10-CM

## 2018-11-14 DIAGNOSIS — D631 Anemia in chronic kidney disease: Principal | ICD-10-CM

## 2018-11-14 DIAGNOSIS — Z940 Kidney transplant status: Principal | ICD-10-CM

## 2018-11-14 LAB — IRON: Iron:MCnc:Pt:Ser/Plas:Qn:: 24 — ABNORMAL LOW

## 2018-11-14 LAB — IRON & TIBC
IRON SATURATION (CALC): 7 % — ABNORMAL LOW (ref 20–50)
IRON: 24 ug/dL — ABNORMAL LOW (ref 35–165)

## 2018-11-14 LAB — CMV DNA, QUANTITATIVE, PCR

## 2018-11-14 LAB — FERRITIN: Ferritin:MCnc:Pt:Ser/Plas:Qn:: 16.6 — ABNORMAL LOW

## 2018-11-14 LAB — CMV COMMENT: Lab: 0

## 2018-11-14 NOTE — Unmapped (Signed)
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 He will repeat blood work tomorrow.  Urine analysis: negative protein/ 3 WBC/ 7 RBC (08/07/18)  Urine protein/creatinine ratio: 0.100.  DSA: not checked    Last CMV checked: not detected - 10/29/18  Last BKV checked: no decoy cells - 08/07/18    Prophylaxis -   - Bactrim 80 mg TIW (MWF) (until 12/25/18)    2) Immunosuppression: Prograf 2/2 mg BID / Myfortic 540 mg BID  - Last Prograf level 6.8 on 11/13/18 and Last dose of Prograf: 9PM  - Goal of 12 hour Prograf trough: 7-9  - Changes in Immunosuppression - continue same dose of prograf.  - Medications side effects: Minor tremors    3) HTN - Controlled  Goal for B.P - <130/80  Changes in B.P medications - No    4) Anemia - stable, Hgb 12.7  Goal for Hemoglobin >12.0  Last Ferritin 34.5 on 06/12/17    5) MBD - Calcium 9.5 / Phosphorus 3.5   iPTH - 464.1 (07/03/18)  - On cholecalciferol 2000u daily    6) Electrolytes: WNL  - On sodium bicarbonate 650 mg BID    7) Immunizations:  Influenza (inactivated only): declines  Pneumococcal vaccination (inactivated only): has not received    8) Cancer screening:  Colonoscopy - due at 43 y.o.    9) Fatigue / SOB - unsure of etiology -   - CXR negative on 09/09/18  - TSH normal (0.803), Pro-BNP elevated (120.0) on 09/02/18  - received 1 dose of Feraheme.  - Schedule a stress ECHO.    Follow-up: Virtual visit in 4 weeks     I spent 6 minutes on the real-time audio and video with the patient. I spent an additional 8 minutes on pre- and post-visit activities. The patient was physically located in [place] or a state in which I am permitted to provide care. The patient and/or parent/guardian understood that s/he may incur co-pays and cost sharing, and agreed to the telemedicine visit. The visit was reasonable and appropriate under the circumstances given the patient's presentation at the time.    The patient and/or parent/guardian has been advised of the potential risks and limitations of this mode of treatment (including, but not limited to, the absence of in-person examination) and has agreed to be treated using telemedicine. The patient's/patient's family's questions regarding telemedicine have been answered.     If the visit was completed in an ambulatory setting, the patient and/or parent/guardian has also been advised to contact their provider???s office for worsening conditions, and seek emergency medical treatment and/or call 911 if the patient deems either necessary.      History of Presenting Illness:     Since patient's last visit in the transplant clinic - patient has been doing well in terms of transplant, taking transplant medications regularly, no episodes of rejection and no side effects of medications.    Since last seen, he received Feraheme infusion on 09/06/18, which he tolerated well.    His visit today is conducted via telephone/video due to the ongoing COVID-19 pandemic.  Still  c/o fatigue and SOB - not changed.  Denies chest pain, fever, chills, nausea, vomiting, diarrhea, abdominal pain or pain over allograft site. Appetite good.    He was admitted 6/2-06/28/18 for living donor renal transplant (from his mother, 41 y.o.), which he received on 06/25/18. Postoperative course was uncomplicated with no delayed graft function. Patient reports initial diagnosis of polycystic kidney disease in early 2000. He had a ruptured appendix in late '99 vs early '00, and in the process of that workup, was diagnosed with PKD. He reports complications of ruptured cysts every 1-2 years, more recently 2-3 times per year; most recent occurrence was the week prior to his transplant surgery. He experiences pain and hematuria for 3 days to 2 weeks leading up to rupture, with instant relief once the cyst ruptures; may experience some residual bleeding after rupture. He denies history of cysts in the pancreas or liver. Also denies any history of UTI. Reports family history of PKD in his father, sister, and sister's daughter.    Reports HTN control has been inconsistent over the last 20 years. He has been better controlled on current regimen of amlodipine and carvedilol.    He reports history of ibuprofen use for headaches, previously occurring 3 times per week. Headaches are currently resolved. He believes they were occurring back when his BPs were labile.          Last dose of Prograf at 9PM   Diabetes: No   HTN: Yes    Controlled:Yes   CAD/Heartfailure: No   Adherence      With Medication: yes    With Follow up: yes    Functional Status: Independent    Review of Systems:     Fever or chills: negative   Sore throat: negative   Fatigue/malaise: negative   Weight loss or gain: negative   New skin rash/lump or bump: negative   Problems with teeth/gums: negative   Chest pain: negative   Cough or shortness of breath: present.  Swelling: negative   Abdominal pain/heartburn/nausea/vomiting or diarrhea: negative   Pain or bleeding when urinating: negative   Twitching/numbness or weakness: negative     Physical Exam:     Not done    Renal Transplant History:    Race: Caucasian   Age of recipient (at time of transplant): 43 y.o.   Cause of kidney disease: ADPKD   Native biopsy: No    Date of transplant: 06/25/2018   Type of transplant: LR    - Donor creatinine: N/A - Any co morbidities: None    - Infection in donor: none    - Ischemia time: 2h 33m    - Crossmatch: negative    - Donor kidney biopsy (06/25/18): moderate arteriosclerosis     Induction: Campath - Alemtuzumab   Maintenance IS at the time of transplant: tacrolimus, Myfortic   DGF: No    Allergies:   Allergies   Allergen Reactions   ??? Benazepril Rash   ??? Clarithromycin Rash   ??? Penicillins Rash        Current Medications:   Current Outpatient Medications   Medication Sig Dispense Refill   ??? acetaminophen (TYLENOL) 500 MG tablet Take 2 tablets (1,000 mg total) by mouth every six (6) hours as needed for pain. 100 tablet 0   ??? amLODIPine (NORVASC) 10 MG tablet Take 1 tablet (10 mg total) by mouth daily. 30 tablet 11   ??? aspirin (ECOTRIN) 81 MG tablet Take 1 tablet (81 mg total) by  mouth daily. 30 tablet 11   ??? carvediloL (COREG) 6.25 MG tablet Take 1 tablet (6.25 mg total) by mouth Two (2) times a day. 180 tablet 3   ??? cholecalciferol, vitamin D3, 50 mcg (2,000 unit) tablet Take 1 tablet (2,000 Units total) by mouth daily.     ??? escitalopram oxalate (LEXAPRO) 10 MG tablet Take 1 tablet (10 mg total) by mouth daily. 90 tablet 3   ??? mycophenolate (MYFORTIC) 180 MG EC tablet Take 3 tablets (540 mg total) by mouth Two (2) times a day. 540 tablet 3   ??? sodium bicarbonate 650 mg tablet Take 1 tablet (650 mg total) by mouth two (2) times a day. 60 tablet 11   ??? sulfamethoxazole-trimethoprim (BACTRIM) 400-80 mg per tablet Take 1 tablet (80 mg of trimethoprim total) by mouth Every Monday, Wednesday, and Friday. 12 tablet 5   ??? tacrolimus (PROGRAF) 1 MG capsule Take 2 capsules in the morning and take 2 capsules at night 450 capsule 3     No current facility-administered medications for this visit.        Past Medical History:   Past Medical History:   Diagnosis Date   ??? ADPKD (autosomal dominant polycystic kidney disease)    ??? Hypertension    ??? Kidney stone    ??? Polycystic kidney disease         Laboratory studies: Recent Results (from the past 170 hour(s))   Tacrolimus level    Collection Time: 11/13/18  8:36 AM   Result Value Ref Range    Tacrolimus, Timed 6.8 ng/mL   Magnesium Level    Collection Time: 11/13/18  8:36 AM   Result Value Ref Range    Magnesium 1.6 1.6 - 2.2 mg/dL   Renal Function Panel    Collection Time: 11/13/18  8:36 AM   Result Value Ref Range    Sodium 141 135 - 145 mmol/L    Potassium 4.4 3.5 - 5.0 mmol/L    Chloride 109 (H) 98 - 107 mmol/L    CO2 24.0 22.0 - 30.0 mmol/L    Anion Gap 8 7 - 15 mmol/L    BUN 21 7 - 21 mg/dL    Creatinine 1.61 (H) 0.70 - 1.30 mg/dL    BUN/Creatinine Ratio 10     EGFR CKD-EPI Non-African American, Male 38 (L) >=60 mL/min/1.38m2    EGFR CKD-EPI African American, Male 44 (L) >=60 mL/min/1.47m2    Glucose 113 70 - 179 mg/dL    Calcium 9.7 8.5 - 09.6 mg/dL    Phosphorus 3.5 2.9 - 4.7 mg/dL    Albumin 4.4 3.5 - 5.0 g/dL   CBC w/ Differential    Collection Time: 11/13/18  8:36 AM   Result Value Ref Range    WBC 3.6 (L) 4.5 - 11.0 10*9/L    RBC 5.06 4.50 - 5.90 10*12/L    HGB 12.7 (L) 13.5 - 17.5 g/dL    HCT 04.5 (L) 40.9 - 53.0 %    MCV 78.6 (L) 80.0 - 100.0 fL    MCH 25.2 (L) 26.0 - 34.0 pg    MCHC 32.0 31.0 - 37.0 g/dL    RDW 81.1 91.4 - 78.2 %    MPV 7.4 7.0 - 10.0 fL    Platelet 238 150 - 440 10*9/L    Neutrophils % 72.4 %    Lymphocytes % 9.5 %    Monocytes % 10.3 %    Eosinophils % 4.6 %    Basophils %  0.4 %    Neutrophil Left Shift 3+ (A) Not Present    Absolute Neutrophils 2.6 2.0 - 7.5 10*9/L    Absolute Lymphocytes 0.3 (L) 1.5 - 5.0 10*9/L    Absolute Monocytes 0.4 0.2 - 0.8 10*9/L    Absolute Eosinophils 0.2 0.0 - 0.4 10*9/L    Absolute Basophils 0.0 0.0 - 0.1 10*9/L    Large Unstained Cells 3 0 - 4 %    Hypochromasia Moderate (A) Not Present   Morphology Review    Collection Time: 11/13/18  8:36 AM   Result Value Ref Range    Smear Review Comments See Comment (A) Undefined    Toxic Granulation Present (A) Not Present       Leeroy Bock, MD

## 2018-11-14 NOTE — Unmapped (Signed)
Dr.jawa was present in clinic during the video visit

## 2018-11-15 ENCOUNTER — Encounter: Admit: 2018-11-15 | Discharge: 2018-11-16 | Payer: BLUE CROSS/BLUE SHIELD

## 2018-11-15 DIAGNOSIS — Z940 Kidney transplant status: Principal | ICD-10-CM

## 2018-11-15 DIAGNOSIS — Z7682 Awaiting organ transplant status: Principal | ICD-10-CM

## 2018-11-15 DIAGNOSIS — R5382 Chronic fatigue, unspecified: Principal | ICD-10-CM

## 2018-11-15 DIAGNOSIS — R0602 Shortness of breath: Principal | ICD-10-CM

## 2018-11-15 LAB — RENAL FUNCTION PANEL
ALBUMIN: 4.3 g/dL (ref 3.5–5.0)
ANION GAP: 8 mmol/L (ref 7–15)
BLOOD UREA NITROGEN: 16 mg/dL (ref 7–21)
BUN / CREAT RATIO: 9
CALCIUM: 9.7 mg/dL (ref 8.5–10.2)
CHLORIDE: 110 mmol/L — ABNORMAL HIGH (ref 98–107)
CO2: 26 mmol/L (ref 22.0–30.0)
CREATININE: 1.77 mg/dL — ABNORMAL HIGH (ref 0.70–1.30)
EGFR CKD-EPI AA MALE: 53 mL/min/{1.73_m2} — ABNORMAL LOW (ref >=60)
EGFR CKD-EPI NON-AA MALE: 46 mL/min/{1.73_m2} — ABNORMAL LOW (ref >=60)
PHOSPHORUS: 2.7 mg/dL — ABNORMAL LOW (ref 2.9–4.7)
POTASSIUM: 5.4 mmol/L — ABNORMAL HIGH (ref 3.5–5.0)
SODIUM: 144 mmol/L (ref 135–145)

## 2018-11-15 LAB — CBC W/ AUTO DIFF
BASOPHILS ABSOLUTE COUNT: 0 10*9/L (ref 0.0–0.1)
BASOPHILS RELATIVE PERCENT: 0.4 %
EOSINOPHILS ABSOLUTE COUNT: 0.1 10*9/L (ref 0.0–0.4)
EOSINOPHILS RELATIVE PERCENT: 4.6 %
HEMATOCRIT: 39.6 % — ABNORMAL LOW (ref 41.0–53.0)
HEMOGLOBIN: 12.5 g/dL — ABNORMAL LOW (ref 13.5–17.5)
LYMPHOCYTES ABSOLUTE COUNT: 0.3 10*9/L — ABNORMAL LOW (ref 1.5–5.0)
LYMPHOCYTES RELATIVE PERCENT: 8.5 %
MEAN CORPUSCULAR HEMOGLOBIN CONC: 31.6 g/dL (ref 31.0–37.0)
MEAN CORPUSCULAR HEMOGLOBIN: 24.9 pg — ABNORMAL LOW (ref 26.0–34.0)
MEAN PLATELET VOLUME: 8.1 fL (ref 7.0–10.0)
MONOCYTES ABSOLUTE COUNT: 0.3 10*9/L (ref 0.2–0.8)
MONOCYTES RELATIVE PERCENT: 9 %
NEUTROPHILS ABSOLUTE COUNT: 2.2 10*9/L (ref 2.0–7.5)
NEUTROPHILS RELATIVE PERCENT: 73.8 %
PLATELET COUNT: 212 10*9/L (ref 150–440)
RED BLOOD CELL COUNT: 5.02 10*12/L (ref 4.50–5.90)
RED CELL DISTRIBUTION WIDTH: 14.7 % (ref 12.0–15.0)
WBC ADJUSTED: 3 10*9/L — ABNORMAL LOW (ref 4.5–11.0)

## 2018-11-15 LAB — EGFR CKD-EPI NON-AA MALE: Lab: 46 — ABNORMAL LOW

## 2018-11-15 LAB — MEAN CORPUSCULAR HEMOGLOBIN: Lab: 24.9 — ABNORMAL LOW

## 2018-11-15 LAB — TACROLIMUS BLOOD: Lab: 7.1

## 2018-11-15 LAB — MAGNESIUM: Magnesium:MCnc:Pt:Ser/Plas:Qn:: 1.8

## 2018-11-18 DIAGNOSIS — N189 Chronic kidney disease, unspecified: Principal | ICD-10-CM

## 2018-11-18 DIAGNOSIS — D631 Anemia in chronic kidney disease: Principal | ICD-10-CM

## 2018-11-18 DIAGNOSIS — Z940 Kidney transplant status: Principal | ICD-10-CM

## 2018-11-18 MED FILL — ASPIRIN 81 MG TABLET,DELAYED RELEASE: ORAL | 30 days supply | Qty: 30 | Fill #4

## 2018-11-18 MED FILL — SULFAMETHOXAZOLE 400 MG-TRIMETHOPRIM 80 MG TABLET: ORAL | 28 days supply | Qty: 12 | Fill #4

## 2018-11-18 MED FILL — ASPIRIN 81 MG TABLET,DELAYED RELEASE: 30 days supply | Qty: 30 | Fill #4 | Status: AC

## 2018-11-18 MED FILL — CARVEDILOL 6.25 MG TABLET: ORAL | 90 days supply | Qty: 180 | Fill #1

## 2018-11-18 MED FILL — CARVEDILOL 6.25 MG TABLET: 90 days supply | Qty: 180 | Fill #1 | Status: AC

## 2018-11-18 MED FILL — AMLODIPINE 10 MG TABLET: ORAL | 30 days supply | Qty: 30 | Fill #3

## 2018-11-18 MED FILL — AMLODIPINE 10 MG TABLET: 30 days supply | Qty: 30 | Fill #3 | Status: AC

## 2018-11-18 MED FILL — SULFAMETHOXAZOLE 400 MG-TRIMETHOPRIM 80 MG TABLET: 28 days supply | Qty: 12 | Fill #4 | Status: AC

## 2018-11-18 NOTE — Unmapped (Signed)
Per Dr. Nestor Lewandowsky, ordered stress echo and IV feraheme

## 2018-11-21 ENCOUNTER — Other Ambulatory Visit: Payer: Self-pay

## 2018-11-21 ENCOUNTER — Ambulatory Visit: Payer: PRIVATE HEALTH INSURANCE | Admitting: Family Medicine

## 2018-11-21 ENCOUNTER — Ambulatory Visit (INDEPENDENT_AMBULATORY_CARE_PROVIDER_SITE_OTHER): Payer: PRIVATE HEALTH INSURANCE | Admitting: Unknown Physician Specialty

## 2018-11-21 ENCOUNTER — Encounter: Payer: Self-pay | Admitting: Unknown Physician Specialty

## 2018-11-21 VITALS — BP 136/91 | HR 73 | Temp 99.3°F | Ht 71.4 in | Wt 242.0 lb

## 2018-11-21 DIAGNOSIS — N185 Chronic kidney disease, stage 5: Secondary | ICD-10-CM

## 2018-11-21 DIAGNOSIS — Z940 Kidney transplant status: Secondary | ICD-10-CM | POA: Diagnosis not present

## 2018-11-21 DIAGNOSIS — I1311 Hypertensive heart and chronic kidney disease without heart failure, with stage 5 chronic kidney disease, or end stage renal disease: Secondary | ICD-10-CM

## 2018-11-21 DIAGNOSIS — N189 Chronic kidney disease, unspecified: Secondary | ICD-10-CM

## 2018-11-21 DIAGNOSIS — D631 Anemia in chronic kidney disease: Secondary | ICD-10-CM

## 2018-11-21 DIAGNOSIS — Z0000 Encounter for general adult medical examination without abnormal findings: Secondary | ICD-10-CM

## 2018-11-21 NOTE — Assessment & Plan Note (Signed)
Per Nephrology.

## 2018-11-21 NOTE — Progress Notes (Signed)
BP (!) 136/91   Pulse 73   Temp 99.3 F (37.4 C) (Oral)   Ht 5' 11.4" (1.814 m)   Wt 242 lb (109.8 kg)   SpO2 98%   BMI 33.38 kg/m    Subjective:    Patient ID: Scott Hickman, male    DOB: 01/05/1976, 43 y.o.   MRN: [id number]  HPI: Scott Hickman is a 43 y.o. male  Chief Complaint  Patient presents with  . Annual Exam   Pt is here for general history and physical  S/P kidney transplant Followed by Dr Rockwell Germany at [facility] for autosomal dominant polycystic kidney disease.  Transplant June 2.  Labs are obtained at [facility].  Has secondary anemia of chronic disease.  Labs have been stable but last K was 5.4  Hypertension Using medications without difficulty Average home BPs Not checking   No problems or lightheadedness No chest pain with exertion or shortness of breath No Edema  Depression Depression screen [facility] 2/9 11/21/2018 11/15/2017 01/06/2016 01/05/2015  Decreased Interest 0 0 0 0  Down, Depressed, Hopeless 0 0 0 0  PHQ - 2 Score 0 0 0 0  Altered sleeping - 1 0 -  Tired, decreased energy - 1 0 -  Change in appetite - 0 0 -  Feeling bad or failure about yourself  - 0 0 -  Trouble concentrating - 0 0 -  Moving slowly or fidgety/restless - 0 0 -  Suicidal thoughts - 0 0 -  PHQ-9 Score - 2 0 -  Difficult doing work/chores - Not difficult at all - -     Relevant past medical, surgical, family and social history reviewed and updated as indicated. Interim medical history since our last visit reviewed. Allergies and medications reviewed and updated.  Review of Systems  Constitutional: Negative.   HENT: Negative.   Eyes: Negative.   Respiratory: Positive for shortness of breath.        Due to anemia of chronic disease and followed at [facility]  Cardiovascular: Negative.   Gastrointestinal: Negative.   Endocrine: Negative.   Genitourinary: Negative.   Musculoskeletal: Negative.   Skin: Negative.   Allergic/Immunologic: Negative.   Neurological: Negative.   Hematological:  Negative.   Psychiatric/Behavioral: Negative.     Per HPI unless specifically indicated above     Objective:    BP (!) 136/91   Pulse 73   Temp 99.3 F (37.4 C) (Oral)   Ht 5' 11.4" (1.814 m)   Wt 242 lb (109.8 kg)   SpO2 98%   BMI 33.38 kg/m   Wt Readings from Last 3 Encounters:  11/21/18 242 lb (109.8 kg)  04/01/18 251 lb (113.9 kg)  02/04/18 272 lb (123.4 kg)    Physical Exam Constitutional:      Appearance: He is well-developed.  HENT:     Head: Normocephalic.     Right Ear: Tympanic membrane, ear canal and external ear normal.     Left Ear: Tympanic membrane, ear canal and external ear normal.     Mouth/Throat:     Pharynx: Uvula midline.  Eyes:     Pupils: Pupils are equal, round, and reactive to light.  Cardiovascular:     Rate and Rhythm: Normal rate and regular rhythm.     Heart sounds: Normal heart sounds. No murmur. No friction rub. No gallop.   Pulmonary:     Effort: Pulmonary effort is normal. No respiratory distress.     Breath sounds: Normal breath sounds.  Abdominal:  General: Bowel sounds are normal. There is no distension.     Palpations: Abdomen is soft.     Tenderness: There is no abdominal tenderness.  Musculoskeletal: Normal range of motion.  Skin:    General: Skin is warm and dry.  Neurological:     Mental Status: He is alert and oriented to person, place, and time.     Deep Tendon Reflexes: Reflexes are normal and symmetric.  Psychiatric:        Behavior: Behavior normal.        Thought Content: Thought content normal.        Judgment: Judgment normal.      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  S/p kidney transplant.  Nephrology is managing all meds.  Will check labs not checked there   Relevant Orders   TSH   PSA   Lipid Panel w/o Chol/HDL Ratio       Follow up plan: Return in about 1 year (around 11/21/2019).

## 2018-11-21 NOTE — Assessment & Plan Note (Signed)
Managed through Nephrology

## 2018-11-21 NOTE — Assessment & Plan Note (Signed)
Managed at [facility]

## 2018-11-22 LAB — PSA: Prostate Specific Ag, Serum: 1.9 ng/mL (ref 0.0–4.0)

## 2018-11-22 LAB — LIPID PANEL W/O CHOL/HDL RATIO
Cholesterol, Total: 153 mg/dL (ref 100–199)
HDL: 43 mg/dL (ref >39)
LDL Chol Calc (NIH): 92 mg/dL (ref 0–99)
Triglycerides: 94 mg/dL (ref 0–149)
VLDL Cholesterol Cal: 18 mg/dL (ref 5–40)

## 2018-11-22 LAB — TSH: TSH: 1.03 u[IU]/mL (ref 0.450–4.500)

## 2018-11-26 ENCOUNTER — Encounter: Admit: 2018-11-26 | Discharge: 2018-11-27 | Payer: BLUE CROSS/BLUE SHIELD

## 2018-11-26 LAB — CBC W/ AUTO DIFF
BASOPHILS ABSOLUTE COUNT: 0 10*9/L (ref 0.0–0.1)
BASOPHILS RELATIVE PERCENT: 0.3 %
EOSINOPHILS ABSOLUTE COUNT: 0.1 10*9/L (ref 0.0–0.4)
EOSINOPHILS RELATIVE PERCENT: 3.5 %
HEMATOCRIT: 40.9 % — ABNORMAL LOW (ref 41.0–53.0)
HEMOGLOBIN: 12.8 g/dL — ABNORMAL LOW (ref 13.5–17.5)
LARGE UNSTAINED CELLS: 3 % (ref 0–4)
LYMPHOCYTES ABSOLUTE COUNT: 0.2 10*9/L — ABNORMAL LOW (ref 1.5–5.0)
LYMPHOCYTES RELATIVE PERCENT: 7.2 %
MEAN CORPUSCULAR HEMOGLOBIN CONC: 31.3 g/dL (ref 31.0–37.0)
MEAN CORPUSCULAR HEMOGLOBIN: 24.5 pg — ABNORMAL LOW (ref 26.0–34.0)
MEAN CORPUSCULAR VOLUME: 78.2 fL — ABNORMAL LOW (ref 80.0–100.0)
MEAN PLATELET VOLUME: 7.6 fL (ref 7.0–10.0)
MONOCYTES ABSOLUTE COUNT: 0.3 10*9/L (ref 0.2–0.8)
NEUTROPHILS ABSOLUTE COUNT: 2.4 10*9/L (ref 2.0–7.5)
NEUTROPHILS RELATIVE PERCENT: 75.5 %
PLATELET COUNT: 252 10*9/L (ref 150–440)
RED BLOOD CELL COUNT: 5.23 10*12/L (ref 4.50–5.90)
RED CELL DISTRIBUTION WIDTH: 14.7 % (ref 12.0–15.0)

## 2018-11-26 LAB — RENAL FUNCTION PANEL
ALBUMIN: 4.5 g/dL (ref 3.5–5.0)
ANION GAP: 8 mmol/L (ref 7–15)
BLOOD UREA NITROGEN: 18 mg/dL (ref 7–21)
BUN / CREAT RATIO: 10
CALCIUM: 9.9 mg/dL (ref 8.5–10.2)
CO2: 27 mmol/L (ref 22.0–30.0)
CREATININE: 1.75 mg/dL — ABNORMAL HIGH (ref 0.70–1.30)
EGFR CKD-EPI AA MALE: 54 mL/min/{1.73_m2} — ABNORMAL LOW (ref >=60)
EGFR CKD-EPI NON-AA MALE: 47 mL/min/{1.73_m2} — ABNORMAL LOW (ref >=60)
GLUCOSE RANDOM: 106 mg/dL (ref 70–179)
PHOSPHORUS: 3 mg/dL (ref 2.9–4.7)
POTASSIUM: 5.6 mmol/L — ABNORMAL HIGH (ref 3.5–5.0)
SODIUM: 142 mmol/L (ref 135–145)

## 2018-11-26 LAB — SODIUM: Sodium:SCnc:Pt:Ser/Plas:Qn:: 142

## 2018-11-26 LAB — TACROLIMUS BLOOD: Lab: 7.9

## 2018-11-26 LAB — MAGNESIUM: Magnesium:MCnc:Pt:Ser/Plas:Qn:: 1.6

## 2018-11-26 LAB — SMEAR REVIEW

## 2018-11-26 LAB — MEAN CORPUSCULAR VOLUME: Lab: 78.2 — ABNORMAL LOW

## 2018-11-27 ENCOUNTER — Encounter
Admit: 2018-11-27 | Discharge: 2018-11-27 | Disposition: A | Discharge status: Home / Self Care | Payer: BLUE CROSS/BLUE SHIELD

## 2018-11-27 ENCOUNTER — Ambulatory Visit
Admit: 2018-11-27 | Discharge: 2018-11-27 | Disposition: A | Discharge status: Home / Self Care | Payer: BLUE CROSS/BLUE SHIELD

## 2018-11-27 DIAGNOSIS — R109 Unspecified abdominal pain: Principal | ICD-10-CM

## 2018-11-27 LAB — CBC W/ AUTO DIFF
BASOPHILS ABSOLUTE COUNT: 0 10*9/L (ref 0.0–0.1)
BASOPHILS RELATIVE PERCENT: 0.1 %
EOSINOPHILS ABSOLUTE COUNT: 0.1 10*9/L (ref 0.0–0.4)
EOSINOPHILS RELATIVE PERCENT: 1.3 %
HEMATOCRIT: 35.5 % — ABNORMAL LOW (ref 41.0–53.0)
HEMOGLOBIN: 11.2 g/dL — ABNORMAL LOW (ref 13.5–17.5)
LARGE UNSTAINED CELLS: 2 % (ref 0–4)
LYMPHOCYTES ABSOLUTE COUNT: 0.2 10*9/L — ABNORMAL LOW (ref 1.5–5.0)
LYMPHOCYTES RELATIVE PERCENT: 5.2 %
MEAN CORPUSCULAR HEMOGLOBIN CONC: 31.6 g/dL (ref 31.0–37.0)
MEAN CORPUSCULAR HEMOGLOBIN: 24.3 pg — ABNORMAL LOW (ref 26.0–34.0)
MEAN PLATELET VOLUME: 6.7 fL — ABNORMAL LOW (ref 7.0–10.0)
MONOCYTES ABSOLUTE COUNT: 0.3 10*9/L (ref 0.2–0.8)
MONOCYTES RELATIVE PERCENT: 8.2 %
NEUTROPHILS ABSOLUTE COUNT: 3.4 10*9/L (ref 2.0–7.5)
NEUTROPHILS RELATIVE PERCENT: 83.1 %
PLATELET COUNT: 223 10*9/L (ref 150–440)
RED BLOOD CELL COUNT: 4.62 10*12/L (ref 4.50–5.90)
RED CELL DISTRIBUTION WIDTH: 14.9 % (ref 12.0–15.0)
WBC ADJUSTED: 4.1 10*9/L — ABNORMAL LOW (ref 4.5–11.0)

## 2018-11-27 LAB — URINALYSIS WITH CULTURE REFLEX
BILIRUBIN UA: NEGATIVE
GLUCOSE UA: NEGATIVE
KETONES UA: NEGATIVE
LEUKOCYTE ESTERASE UA: NEGATIVE
PH UA: 5.5 (ref 5.0–9.0)
PROTEIN UA: 30 — AB
RBC UA: >100 /HPF — ABNORMAL HIGH (ref <3)
SPECIFIC GRAVITY UA: 1.025 (ref 1.005–1.040)
SQUAMOUS EPITHELIAL: 0 /HPF (ref 0–5)
UROBILINOGEN UA: 0.2
WBC UA: 2 /HPF — ABNORMAL HIGH (ref <2)

## 2018-11-27 LAB — D-DIMER QUANTITATIVE (CH,ML,PD,ET): Fibrin D-dimer DDU:MCnc:Pt:PPP:Qn:: <150

## 2018-11-27 LAB — TROPONIN I
TROPONIN I: <0.06 ng/mL (ref <0.060)
Troponin I.cardiac:MCnc:Pt:Ser/Plas:Qn:: <0.06
Troponin I.cardiac:MCnc:Pt:Ser/Plas:Qn:: <0.06

## 2018-11-27 LAB — COMPREHENSIVE METABOLIC PANEL
ALBUMIN: 4.2 g/dL (ref 3.5–5.0)
ALKALINE PHOSPHATASE: 88 U/L (ref 38–126)
ALT (SGPT): 14 U/L (ref <50)
ANION GAP: 11 mmol/L (ref 7–15)
AST (SGOT): 18 U/L — ABNORMAL LOW (ref 19–55)
BILIRUBIN TOTAL: 0.5 mg/dL (ref 0.0–1.2)
BUN / CREAT RATIO: 11
CALCIUM: 9.3 mg/dL (ref 8.5–10.2)
CHLORIDE: 109 mmol/L — ABNORMAL HIGH (ref 98–107)
CO2: 20 mmol/L — ABNORMAL LOW (ref 22.0–30.0)
CREATININE: 1.8 mg/dL — ABNORMAL HIGH (ref 0.70–1.30)
EGFR CKD-EPI NON-AA MALE: 45 mL/min/{1.73_m2} — ABNORMAL LOW (ref >=60)
GLUCOSE RANDOM: 152 mg/dL (ref 70–179)
PROTEIN TOTAL: 6.7 g/dL (ref 6.5–8.3)
SODIUM: 140 mmol/L (ref 135–145)

## 2018-11-27 LAB — LIPASE: Triacylglycerol lipase:CCnc:Pt:Ser/Plas:Qn:: 213

## 2018-11-27 LAB — HEMOGLOBIN: Hemoglobin:MCnc:Pt:Bld:Qn:: 11.2 — ABNORMAL LOW

## 2018-11-27 LAB — CMV VIRAL LD: Lab: NOT DETECTED

## 2018-11-27 LAB — TACROLIMUS BLOOD: Lab: 8.1

## 2018-11-27 LAB — MAGNESIUM: Magnesium:MCnc:Pt:Ser/Plas:Qn:: 1.4 — ABNORMAL LOW

## 2018-11-27 LAB — CMV DNA, QUANTITATIVE, PCR

## 2018-11-27 LAB — SMEAR REVIEW

## 2018-11-27 LAB — PHOSPHORUS: Phosphate:MCnc:Pt:Ser/Plas:Qn:: 2.7 — ABNORMAL LOW

## 2018-11-27 LAB — BLOOD UA

## 2018-11-27 LAB — BLOOD UREA NITROGEN: Urea nitrogen:MCnc:Pt:Ser/Plas:Qn:: 19

## 2018-11-27 MED ORDER — OXYCODONE 5 MG CAPSULE
ORAL_CAPSULE | ORAL | 0 refills | 2.00000 days | Status: CP | PRN
Start: 2018-11-27 — End: 2018-12-02

## 2018-11-27 NOTE — Unmapped (Signed)
Pt presents with c/o left sided chest pain and left flank pain.  Pain since 8pm, took 2 hydrocodones without improvement.  Pt also endorses SOB.

## 2018-11-27 NOTE — Unmapped (Signed)
Emergency Department Provider Note        ED Clinical Impression     Final diagnoses:   Flank pain (Primary)       ED Assessment/Plan   43 year old male with left sided abdominal pain radiating into his abdomen.  EKG with inferior Q waves, but no acute changes.  No diaphoresis or nausea.  Mild shortness of breath when pain is sharp.  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  Differential includes diverticulitis, complication with transplanted kidney, rupture of kidney cyst.  Plan for labs, chest x-ray and ultrasound of the kidney along with noncontrast CT scan    Ultrasound shows patchy perfusion, but resistive indices are stable.  Patient updated on this and will follow up with his nephrologist tomorrow.  Fluid around pancreas without reason for pancreatitis, negative lipase.  May be reactive due to recent renal cyst rupture.  Patient does have blood in his urine and pain is similar to prior renal cyst rupture.  D-dimer negative chest x-ray negative EKG and troponin also negative.  Patient with pain under control.  Return precautions discussed    History     Chief Complaint   Patient presents with   ??? Chest Pain     HPI  43 year old male presents to the emergency department with complaint of left lateral abdominal pain radiating up into his chest.  Symptoms started at 8 AM, and persisted throughout the day.  He reports that he has been passing gas and having regular bowel movements.  He reports that he has been urinating well.  He reports that the pain feels similar to when his kidney has a cyst that ruptures.  He denies any fever or chills.      Past Medical History:   Diagnosis Date   ??? ADPKD (autosomal dominant polycystic kidney disease)    ??? Hypertension    ??? Kidney stone    ??? Polycystic kidney disease        Past Surgical History:   Procedure Laterality Date   ??? APPENDECTOMY     ??? EXPLORATORY LAPAROTOMY     ??? NEPHRECTOMY TRANSPLANTED ORGAN ??? PR AV ANAST,UP ARM BASILIC VEIN TRANSPOSIT Left 1/61/0960    Procedure: ARTERIOVENOUS ANASTOMOSIS, OPEN; BY UPPER ARM BASILIC VEIN TRANSPOSITION;  Surgeon: Leona Carry, MD;  Location: MAIN OR [facility];  Service: Transplant   ??? PR CREAT AV FISTULA,AUTOGENOUS GRAFT Left 12/05/2017    Procedure: CREATE AV FISTULA (SEPART PROC); AUTOG GFT, UPPER EXTREMITY;  Surgeon: Leona Carry, MD;  Location: MAIN OR [facility];  Service: Transplant   ??? PR TRANSPLANT,PREP CADAVER RENAL GRAFT N/A 06/25/2018    Procedure: [facility] STD PREP CAD DONR RENAL ALLOGFT PRIOR TO TRNSPLNT, INCL DISSEC/REM PERINEPH FAT, DIAPH/RTPER ATTAC;  Surgeon: Leona Carry, MD;  Location: MAIN OR [facility];  Service: Transplant   ??? PR TRANSPLANTATION OF KIDNEY N/A 06/25/2018    Procedure: Priority RENAL ALLOTRANSPLANTATION, IMPLANTATION OF GRAFT; WITHOUT RECIPIENT NEPHRECTOMY;  Surgeon: Leona Carry, MD;  Location: MAIN OR [facility];  Service: Transplant       Family History   Problem Relation Age of Onset   ??? Hypertension Father    ??? Kidney disease Father    ??? Polycystic kidney disease Father    ??? No Known Problems Mother    ??? Prostate cancer Neg Hx    ??? GU problems Neg Hx        Social History     Socioeconomic History   ??? Marital  status: Married     Spouse name: None   ??? Number of children: None   ??? Years of education: None   ??? Highest education level: None   Occupational History   ??? None   Social Needs   ??? Financial resource strain: None   ??? Food insecurity     Worry: None     Inability: None   ??? Transportation needs     Medical: None     Non-medical: None   Tobacco Use   ??? Smoking status: Former Smoker     Types: Cigarettes     Quit date: 06/22/2001     Years since quitting: 17.4   ??? Smokeless tobacco: Former Neurosurgeon     Types: Chew     Quit date: 06/23/2015   Substance and Sexual Activity   ??? Alcohol use: No     Alcohol/week: 0.0 standard drinks   ??? Drug use: No   ??? Sexual activity: None   Lifestyle   ??? Physical activity Days per week: None     Minutes per session: None   ??? Stress: None   Relationships   ??? Social Wellsite geologist on phone: None     Gets together: None     Attends religious service: None     Active member of club or organization: None     Attends meetings of clubs or organizations: None     Relationship status: None   Other Topics Concern   ??? None   Social History Narrative   ??? None       Review of Systems  10+ point review of systems negative except as detailed in HPI.      Physical Exam     BP 125/86  - Pulse 77  - Temp 36.6 ??C (97.9 ??F) (Oral)  - Resp 20  - SpO2 100%     Physical Exam  Vitals signs and nursing note reviewed.   Constitutional:       General: He is not in acute distress.     Appearance: He is well-developed. He is not diaphoretic.   HENT:      Head: Normocephalic and atraumatic.      Nose: Nose normal.      Mouth/Throat:      Pharynx: No oropharyngeal exudate.   Eyes:      Conjunctiva/sclera: Conjunctivae normal.      Pupils: Pupils are equal, round, and reactive to light.   Neck:      Musculoskeletal: Normal range of motion and neck supple.      Thyroid: No thyromegaly.      Vascular: No JVD.      Trachea: No tracheal deviation.   Cardiovascular:      Heart sounds: Normal heart sounds.   Pulmonary:      Effort: Pulmonary effort is normal.      Breath sounds: Normal breath sounds.   Abdominal:      General: Bowel sounds are normal. There is no distension.      Palpations: Abdomen is soft. There is no mass.      Tenderness: There is abdominal tenderness. There is no guarding or rebound.      Hernia: No hernia is present.      Comments: Tender to palpation left mid and upper abdomen, tender over lower costal margin   Musculoskeletal: Normal range of motion.         General: No tenderness or deformity.   Lymphadenopathy:  Cervical: No cervical adenopathy.   Skin:     General: Skin is warm and dry.      Capillary Refill: Capillary refill takes less than 2 seconds. Coloration: Skin is not pale.      Findings: No rash.   Neurological:      Mental Status: He is alert and oriented to person, place, and time.      Cranial Nerves: No cranial nerve deficit.      Sensory: No sensory deficit.      Motor: No abnormal muscle tone.      Coordination: Coordination normal.      Deep Tendon Reflexes: Reflexes normal.         ED Course           MDM  Reviewed: previous chart, nursing note and vitals  Reviewed previous: labs, ECG and x-ray  Interpretation: labs, ECG and x-ray      CT Abdomen Pelvis Without Contrast (Renal Protocol)   Preliminary Result   Small volume peripancreatic free fluid may represent recent acute pancreatitis. Recommend clinical correlation.          US Renal Transplant W Doppler   Preliminary Result   -New patchy perfusion throughout the renal parenchyma. Attention on follow-up.   -Stable to slightly increased resistive indices in the renal transplant arteries, however within normal limits.   -Unchanged 2.6 cm linear echogenic focus in the mid inferior venal transplant.            Please see below for data measurements:         Renal Transplant: length 11.65 cm; width 4.85 cm; height 5.50 cm      Arcuate artery superior resistive index: 0.63   Arcuate artery mid resistive index: 0.70   Arcuate artery inferior resistive index: 0.71      Previous resistive indices range of arcuate arteries: 0.54-0.63      Segmental artery superior resistive index: 0.71   Segmental artery mid resistive index: 0.71   Segmental artery inferior resistive index: 0.66      Previous resistive indices range of segmental arteries: 0.65-0.75      Main renal artery hilum resistive index: 0.68   Main renal artery mid resistive index: 0.75   Main renal artery anastomosis resistive index: 0.73      Previous resistive indices range of main renal artery: 0.59-0.66      Main renal vein: patent      Iliac artery: Patent   Iliac vein: Patent      Bladder volume prevoid: 111.38  mL          4.85   5.50 11.65   0.63   0.70   0.71   0.71   0.71   0.66   0.68   0.75   0.73      XR Chest 2 views   Final Result      No acute airspace disease.               Labs Reviewed   COMPREHENSIVE METABOLIC PANEL - Abnormal; Notable for the following components:       Result Value    Chloride 109 (*)     CO2 20.0 (*)     Creatinine 1.80 (*)     EGFR CKD-EPI Non-African American, Male 56 (*)     EGFR CKD-EPI African American, Male 39 (*)     AST 18 (*)     All other components within normal limits   MAGNESIUM -  Abnormal; Notable for the following components:    Magnesium 1.4 (*)     All other components within normal limits   PHOSPHORUS - Abnormal; Notable for the following components:    Phosphorus 2.7 (*)     All other components within normal limits   URINALYSIS WITH CULTURE REFLEX - Abnormal; Notable for the following components:    Protein, UA 30 mg/dL (*)     Blood, UA Large (*)     RBC, UA >100 (*)     WBC, UA 2 (*)     Bacteria, UA Few (*)     All other components within normal limits   CBC W/ AUTO DIFF - Abnormal; Notable for the following components:    WBC 4.1 (*)     HGB 11.2 (*)     HCT 35.5 (*)     MCV 76.9 (*)     MCH 24.3 (*)     MPV 6.7 (*)     Neutrophil Left Shift 2+ (*)     Absolute Lymphocytes 0.2 (*)     Microcytosis Slight (*)     Hypochromasia Slight (*)     All other components within normal limits    Narrative:     Please use the Absolute Differential for reference ranges.    SLIDE REVIEW - Abnormal; Notable for the following components:    Smear Review Comments See Comment (*)     All other components within normal limits    Narrative:     Smear reviewed   TROPONIN I - Normal   TROPONIN I - Normal   LIPASE - Normal   D-DIMER, QUANTITATIVE - Normal    Narrative:     When used in conjunction with a clinical Pre-Test Probability assessment to exclude the venous thromboembolism (VTE) in outpatients suspected of deep vein thrombosis (DVT) and/or pulmonary embolism (PE), a cut-off of <230ng/mL is??recommended. Note: Due to the lack of an International Reference Standard some manufacturers and literature references express D-dimer results in FEU (Fibrinogen Equivalent Units), while the above results are expressed in D-DU (D-dimer Units). The conversion is 2 FEU = 1 D-DU, so some literature may quote a cut-off value approximately double that shown above.      URINE CULTURE   CBC W/ DIFFERENTIAL    Narrative:     The following orders were created for panel order CBC w/ Differential.  Procedure                               Abnormality         Status                     ---------                               -----------         ------                     CBC w/ [id number]         Abnormal            Final result               Morphology [id number]           Abnormal            Final result  Please view results for these tests on the individual orders.   TROPONIN I   TACROLIMUS LEVEL, TIMED          Desma Mcgregor, MD  11/27/18 [phone]

## 2018-11-28 ENCOUNTER — Encounter: Admit: 2018-11-28 | Discharge: 2018-11-29 | Payer: BLUE CROSS/BLUE SHIELD

## 2018-11-28 DIAGNOSIS — N189 Chronic kidney disease, unspecified: Principal | ICD-10-CM

## 2018-11-28 DIAGNOSIS — D631 Anemia in chronic kidney disease: Principal | ICD-10-CM

## 2018-11-28 NOTE — Unmapped (Signed)
Target heart rate acheived

## 2018-11-28 NOTE — Unmapped (Signed)
Pt in for infusion of Feraheme. Denies any adverse reactions to medication. Reviewed s/sx of reactions to report and instructed to seek medical treatment if they present.   1030- Premedication given: Tylenol 650mg . Benadryl 25mg  not given due to pt driving.  1047-IV inserted to right forearm with 24G jelco x1 attempt. Blood return noted and flushed with NS.    1050- NS infused via KVO pump.  1054- Ferahaem infused at 512ml/hr as per order. Pt resting. Call bell placed.   1110- Ferahaem completed. NS infusing KVO. IV site is free of redness and swelling. Pt to remain for 30 minutes post infusion for observation. Pt has no c/o of discomfort when asked.   1140-Ns complete. IV d/c-site is free of redness and swelling. S/sx of adverse reactions reviewed again and pt instructed to seek medical treatment if they arise. Pt left clinic in stable condition.

## 2018-11-28 NOTE — Unmapped (Signed)
Patient returned to baseline. Met discharge criteria.

## 2018-12-02 MED ORDER — ALBUTEROL SULFATE HFA 90 MCG/ACTUATION AEROSOL INHALER
11 refills | 0 days | Status: CP
Start: 2018-12-02 — End: ?

## 2018-12-02 MED ORDER — AEROCHAMBER MV SPACER
1 refills | 1 days | Status: CP | PRN
Start: 2018-12-02 — End: ?

## 2018-12-02 NOTE — Unmapped (Signed)
Pt called with continued shortness of breath with trying to take a deep breath.  All recent studies were reviewed with Dr. Nestor Lewandowsky. BP is 118-120's/60-70's  He is having some pain in chest in the middle and down to the left. He feels like something is pushing against his chest on the left when he takes a deep breath. He is also bruising easily he says.  Ordered albuterol inhaler 2 puffs every 4-6 hours with spacer for the next week and then we will re-evaluate.

## 2018-12-05 ENCOUNTER — Encounter: Payer: Self-pay | Admitting: Unknown Physician Specialty

## 2018-12-05 NOTE — Progress Notes (Signed)
Letter sent.  Lab normal resuls

## 2018-12-09 ENCOUNTER — Ambulatory Visit
Admit: 2018-12-09 | Discharge: 2018-12-13 | Disposition: A | Discharge status: Home / Self Care | Payer: BLUE CROSS/BLUE SHIELD | Admitting: Student in an Organized Health Care Education/Training Program

## 2018-12-09 LAB — URINALYSIS WITH CULTURE REFLEX
GLUCOSE UA: NEGATIVE
PH UA: 5 (ref 5.0–9.0)
RBC UA: >100 /HPF — ABNORMAL HIGH (ref <=3)
SQUAMOUS EPITHELIAL: <1 /HPF (ref 0–5)
UROBILINOGEN UA: 0.2
WBC UA: 50 /HPF — ABNORMAL HIGH (ref <=2)

## 2018-12-09 LAB — CBC W/ AUTO DIFF
BASOPHILS ABSOLUTE COUNT: 0 10*9/L (ref 0.0–0.1)
BASOPHILS RELATIVE PERCENT: 0.2 %
EOSINOPHILS ABSOLUTE COUNT: 0.1 10*9/L (ref 0.0–0.4)
EOSINOPHILS RELATIVE PERCENT: 2.8 %
HEMATOCRIT: 28.7 % — ABNORMAL LOW (ref 41.0–53.0)
HEMOGLOBIN: 9 g/dL — ABNORMAL LOW (ref 13.5–17.5)
LARGE UNSTAINED CELLS: 3 % (ref 0–4)
LYMPHOCYTES ABSOLUTE COUNT: 0.3 10*9/L — ABNORMAL LOW (ref 1.5–5.0)
MEAN CORPUSCULAR HEMOGLOBIN CONC: 31.2 g/dL (ref 31.0–37.0)
MEAN CORPUSCULAR HEMOGLOBIN: 25.2 pg — ABNORMAL LOW (ref 26.0–34.0)
MEAN CORPUSCULAR VOLUME: 80.8 fL (ref 80.0–100.0)
MEAN PLATELET VOLUME: 8.4 fL (ref 7.0–10.0)
MONOCYTES ABSOLUTE COUNT: 0.4 10*9/L (ref 0.2–0.8)
MONOCYTES RELATIVE PERCENT: 10.8 %
NEUTROPHILS ABSOLUTE COUNT: 2.5 10*9/L (ref 2.0–7.5)
NEUTROPHILS RELATIVE PERCENT: 74.4 %
PLATELET COUNT: 314 10*9/L (ref 150–440)
RED BLOOD CELL COUNT: 3.55 10*12/L — ABNORMAL LOW (ref 4.50–5.90)
RED CELL DISTRIBUTION WIDTH: 15.4 % — ABNORMAL HIGH (ref 12.0–15.0)
WBC ADJUSTED: 3.3 10*9/L — ABNORMAL LOW (ref 4.5–11.0)

## 2018-12-09 LAB — COMPREHENSIVE METABOLIC PANEL
ALKALINE PHOSPHATASE: 65 U/L (ref 38–126)
ALT (SGPT): 9 U/L (ref <50)
ANION GAP: 6 mmol/L — ABNORMAL LOW (ref 7–15)
AST (SGOT): 18 U/L — ABNORMAL LOW (ref 19–55)
BILIRUBIN TOTAL: 0.5 mg/dL (ref 0.0–1.2)
BLOOD UREA NITROGEN: 21 mg/dL (ref 7–21)
BUN / CREAT RATIO: 11
CALCIUM: 8.9 mg/dL (ref 8.5–10.2)
CO2: 24 mmol/L (ref 22.0–30.0)
CREATININE: 1.93 mg/dL — ABNORMAL HIGH (ref 0.70–1.30)
EGFR CKD-EPI AA MALE: 48 mL/min/{1.73_m2} — ABNORMAL LOW (ref >=60)
EGFR CKD-EPI NON-AA MALE: 41 mL/min/{1.73_m2} — ABNORMAL LOW (ref >=60)
GLUCOSE RANDOM: 105 mg/dL (ref 70–179)
POTASSIUM: 4.7 mmol/L (ref 3.5–5.0)
PROTEIN TOTAL: 6.2 g/dL — ABNORMAL LOW (ref 6.5–8.3)
SODIUM: 133 mmol/L — ABNORMAL LOW (ref 135–145)

## 2018-12-09 LAB — BLOOD GAS CRITICAL CARE PANEL, VENOUS
BASE EXCESS VENOUS: -1.7 (ref -2.0–2.0)
CALCIUM IONIZED VENOUS (MG/DL): 4.88 mg/dL (ref 4.40–5.40)
GLUCOSE WHOLE BLOOD: 111 mg/dL (ref 70–179)
HCO3 VENOUS: 23 mmol/L (ref 22–27)
HEMOGLOBIN BLOOD GAS: 9.2 g/dL — ABNORMAL LOW (ref 13.50–17.50)
LACTATE BLOOD VENOUS: 0.7 mmol/L (ref 0.5–1.8)
O2 SATURATION VENOUS: 44.6 % (ref 40.0–85.0)
PCO2 VENOUS: 43 mmHg (ref 40–60)
POTASSIUM WHOLE BLOOD: 4.3 mmol/L (ref 3.4–4.6)
SODIUM WHOLE BLOOD: 132 mmol/L — ABNORMAL LOW (ref 135–145)

## 2018-12-09 LAB — MAGNESIUM: Magnesium:MCnc:Pt:Ser/Plas:Qn:: 1.6

## 2018-12-09 LAB — LACTATE BLOOD VENOUS: Lactate:SCnc:Pt:BldV:Qn:: 0.7

## 2018-12-09 LAB — NEUTROPHILS ABSOLUTE COUNT: Neutrophils:NCnc:Pt:Bld:Qn:Automated count: 2.5

## 2018-12-09 LAB — POTASSIUM WHOLE BLOOD: Potassium:SCnc:Pt:Bld:Qn:: 4.3

## 2018-12-09 LAB — SMEAR REVIEW: Lab: 0

## 2018-12-09 LAB — SODIUM: Sodium:SCnc:Pt:Ser/Plas:Qn:: 133 — ABNORMAL LOW

## 2018-12-09 LAB — D-DIMER QUANTITATIVE (CH,ML,PD,ET): Fibrin D-dimer DDU:MCnc:Pt:PPP:Qn:: 526 — ABNORMAL HIGH

## 2018-12-09 LAB — RBC UA: Erythrocytes:Naric:Pt:Urine sed:Qn:Microscopy.light.HPF: >100 — ABNORMAL HIGH

## 2018-12-09 LAB — THYROID STIMULATING HORMONE: Thyrotropin:ACnc:Pt:Ser/Plas:Qn:: 0.603

## 2018-12-09 NOTE — Unmapped (Signed)
Pt c/o continued left sided pain, worse with deep breaths. He is still having blood in his urine.  He is now running a fever of 1008 with tylenol.  This was reviewed with Dr. Nestor Lewandowsky, pt ordered to come to the ER.  I called the ER to let them know he was coming.

## 2018-12-10 LAB — IRON PANEL
IRON: 15 ug/dL — ABNORMAL LOW (ref 35–165)
TOTAL IRON BINDING CAPACITY (CALC): 235.7 mg/dL — ABNORMAL LOW (ref 252.0–479.0)

## 2018-12-10 LAB — CRYPTOCOCCAL ANTIGEN: Lab: NEGATIVE

## 2018-12-10 LAB — BASIC METABOLIC PANEL
ANION GAP: 6 mmol/L — ABNORMAL LOW (ref 7–15)
BLOOD UREA NITROGEN: 20 mg/dL (ref 7–21)
BUN / CREAT RATIO: 11
CHLORIDE: 106 mmol/L (ref 98–107)
CO2: 22 mmol/L (ref 22.0–30.0)
CREATININE: 1.9 mg/dL — ABNORMAL HIGH (ref 0.70–1.30)
EGFR CKD-EPI AA MALE: 49 mL/min/{1.73_m2} — ABNORMAL LOW (ref >=60)
EGFR CKD-EPI NON-AA MALE: 42 mL/min/{1.73_m2} — ABNORMAL LOW (ref >=60)
POTASSIUM: 4.9 mmol/L (ref 3.5–5.0)
SODIUM: 134 mmol/L — ABNORMAL LOW (ref 135–145)

## 2018-12-10 LAB — MEAN CORPUSCULAR HEMOGLOBIN: Lab: 25.2 — ABNORMAL LOW

## 2018-12-10 LAB — TRANSFERRIN: Transferrin:MCnc:Pt:Ser/Plas:Qn:: 187.1 — ABNORMAL LOW

## 2018-12-10 LAB — CBC
HEMATOCRIT: 28.5 % — ABNORMAL LOW (ref 41.0–53.0)
HEMOGLOBIN: 8.8 g/dL — ABNORMAL LOW (ref 13.5–17.5)
MEAN CORPUSCULAR HEMOGLOBIN CONC: 30.8 g/dL — ABNORMAL LOW (ref 31.0–37.0)
MEAN CORPUSCULAR HEMOGLOBIN: 25.2 pg — ABNORMAL LOW (ref 26.0–34.0)
PLATELET COUNT: 298 10*9/L (ref 150–440)
RED BLOOD CELL COUNT: 3.49 10*12/L — ABNORMAL LOW (ref 4.50–5.90)
RED CELL DISTRIBUTION WIDTH: 15.3 % — ABNORMAL HIGH (ref 12.0–15.0)
WBC ADJUSTED: 2.9 10*9/L — ABNORMAL LOW (ref 4.5–11.0)

## 2018-12-10 LAB — TACROLIMUS, TROUGH
Lab: 6.2
Lab: 7.4

## 2018-12-10 LAB — PHOSPHORUS: Phosphate:MCnc:Pt:Ser/Plas:Qn:: 3.4

## 2018-12-10 LAB — MAGNESIUM: Magnesium:MCnc:Pt:Ser/Plas:Qn:: 1.7

## 2018-12-10 LAB — CHLORIDE: Chloride:SCnc:Pt:Ser/Plas:Qn:: 106

## 2018-12-10 NOTE — Unmapped (Signed)
Bed: G143  Expected date: 12/09/18  Expected time: 9:01 PM  Means of arrival:   Comments:

## 2018-12-10 NOTE — Unmapped (Signed)
General Medicine History and Physical    Assessment/Plan:    Active Problems:    * No active hospital problems. *    Ethan West is a 43 y.o. 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    #Fevers, dyspnea and L flank pain: I am unsure of what exactly may be causing his constellation of symptoms. Notably, the patient has been on albuterol and has no prior history of obstructive pulmonary disease. Do not suspect that COPD or asthma is at play here. Stress echo and troponins have been negative, making a cardiac etiology to be less likely. Infection is certainly on the differential given his fevers; I suspect that his L flank pain is from #ruptured cysts as evidenced by his CT from 2 weeks prior. This is likely causing his ongoing hematuria and marked discoloration of the UA. Renal graft infection would cause fevers (and the patient has a UA concerning for infection), but I am unsure about his dyspnea. He may have multiple pathologies at play. I would be further worried about #PE (he has no clear unilateral edema, however, nor is he tachycardic, hypotensive or hypoxic). The patient may have #ILD or a restrictive pulmonary pathology. Alternatively, the patient has been having hematuria and his hemoglobin is down from 11.2, so perhaps he is symptomatic from his #anemia. COVID-19 and influenza negative.  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 Concerning for infection in graft or elsewhere. Baseline creatinine seems to be ~1.7  --Renal transplant U/S  --Renal U/S  --UCx  --CMV PCR  --EBV PCR  --Continue to antibiose with cefepime --Continuing immunosuppressive therapy with mycophenolate 540 mg bid and tacrolimus 2 mg daily  --Tac level    #HTN:  --Amlodipine 10 mg daily    #FEN/GI  --Replete electrolytes PRN for K>4/Mg>2  --Regular diet  --No IV fluids  --FULL CODE  --DVT PPx w/ 30 mg lovenox SQ    ___________________________________________________________________    Chief Complaint:  Chief Complaint   Patient presents with   ??? Fever Immunocompromised     No Principal Problem: There is no principal problem currently on the Problem List. Please update the Problem List and refresh.    HPI:  Ethan West is a 43 y.o. 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    Patient went to ED couple of weeks ago (11/4) and felt like a cyst had ruptured. He had substernal chest pain as well which caused him to present. At the time, his work-up was largely benign and consisted of CXR, echocardiogram and CT. He was found to have some fluid around his pancreas but a negative lipase. D-dimer was negative, troponin was negative. CXR negative, renal ultrasound of transplanted kidney with new, mild patchy perfusion throughout parenchyma, stable to slightly increased resistive indices within normal limits. Stress echo was negative. He had ongoing exertional dyspnea and was started on an albuterol. He denies any improvement while taking in 2 puffs q4h. His chest discomfort from his initial presentation resolved but his dyspnea persisted. He  noticed that he has been urinating blood for approximately 3 weeks. He states that he initially had dark red urine which transitioned to coca-cola colored urine. He has had ongoing L flank pain. He has had lower blood pressures at home and temperature (BP 111/69, 104/70 this AM - T: 100.1-100.4, Tmax 101.8). His work has been limited by dyspnea and fatigue. No orthopnea. No PND. No cough. No history of COPD or asthma. Used to smoke (1 ppd x5y). No hx of blood clots or heart disease. No palpitations. Occasional jitteriness. ROS positive for nausea, decreased appetite, fevers, chills, night sweats, L flank pain and dyspnea but negative for vomiting, chest pain or sick contacts.    Allergies:  Benazepril, Clarithromycin, and Penicillins    Medications:   Prior to Admission medications    Medication Dose, Route, Frequency   acetaminophen (TYLENOL) 500 MG tablet 1,000 mg, Oral, Every 6 hours PRN   albuterol HFA 90 mcg/actuation inhaler 2 puffs every 4-6 hours with spacer   amLODIPine (NORVASC) 10 MG tablet 10 mg, Oral, Daily (standard)   aspirin (ECOTRIN) 81 MG tablet 81 mg, Oral, Daily (standard)   carvediloL (COREG) 6.25 MG tablet 6.25 mg, Oral, 2 times a day (standard)   cholecalciferol, vitamin D3, 50 mcg (2,000 unit) tablet 2,000 Units, Oral, Daily (standard)   escitalopram oxalate (LEXAPRO) 10 MG tablet 10 mg, Oral, Daily (standard)   inhalational spacing device (AEROCHAMBER MV) Spcr 1 each, Miscellaneous, 6 times a day PRN   mycophenolate (MYFORTIC) 180 MG EC tablet 540 mg, Oral, 2 times a day (standard)   sodium bicarbonate 650 mg tablet 650 mg, Oral, 2 times a day   sulfamethoxazole-trimethoprim (BACTRIM) 400-80 mg per tablet 1 tablet, Oral, Every Mon-Wed-Fri tacrolimus (PROGRAF) 1 MG capsule Take 2 capsules in the morning and take 2 capsules at night       Medical History:  Past Medical History:   Diagnosis Date   ??? ADPKD (autosomal dominant polycystic kidney disease)    ??? Hypertension    ??? Kidney stone    ??? Polycystic kidney disease        Surgical History:  Past Surgical History:   Procedure Laterality Date   ??? APPENDECTOMY     ??? EXPLORATORY LAPAROTOMY     ??? NEPHRECTOMY TRANSPLANTED ORGAN     ??? PR AV ANAST,UP ARM BASILIC VEIN TRANSPOSIT Left 1/61/0960    Procedure: ARTERIOVENOUS ANASTOMOSIS, OPEN; BY UPPER ARM BASILIC VEIN TRANSPOSITION;  Surgeon: Leona Carry, MD;  Location: MAIN OR [facility];  Service: Transplant   ??? PR CREAT AV FISTULA,AUTOGENOUS GRAFT Left 12/05/2017    Procedure: CREATE AV FISTULA (SEPART PROC); AUTOG GFT, UPPER EXTREMITY;  Surgeon: Leona Carry, MD;  Location: MAIN OR [facility];  Service: Transplant   ??? PR TRANSPLANT,PREP CADAVER RENAL GRAFT N/A 06/25/2018    Procedure: [facility] STD PREP CAD DONR RENAL ALLOGFT PRIOR TO TRNSPLNT, INCL DISSEC/REM PERINEPH FAT, DIAPH/RTPER ATTAC;  Surgeon: Leona Carry, MD;  Location: MAIN OR [facility];  Service: Transplant   ??? PR TRANSPLANTATION OF KIDNEY N/A 06/25/2018    Procedure: Priority RENAL ALLOTRANSPLANTATION, IMPLANTATION OF GRAFT; WITHOUT RECIPIENT NEPHRECTOMY;  Surgeon: Leona Carry, MD;  Location: MAIN OR [facility];  Service: Transplant       Social History:  Social History     Socioeconomic History   ??? Marital status: Married     Spouse name: Not on file   ??? Number of children: Not on file   ??? Years of education: Not on  file   ??? Highest education level: Not on file   Occupational History   ??? Not on file   Social Needs   ??? Financial resource strain: Not on file   ??? Food insecurity     Worry: Not on file     Inability: Not on file   ??? Transportation needs     Medical: Not on file     Non-medical: Not on file   Tobacco Use   ??? Smoking status: Former Smoker     Types: Cigarettes Quit date: 06/22/2001     Years since quitting: 17.4   ??? Smokeless tobacco: Former Neurosurgeon     Types: Chew     Quit date: 06/23/2015   Substance and Sexual Activity   ??? Alcohol use: No     Alcohol/week: 0.0 standard drinks   ??? Drug use: No   ??? Sexual activity: Not on file   Lifestyle   ??? Physical activity     Days per week: Not on file     Minutes per session: Not on file   ??? Stress: Not on file   Relationships   ??? Social Wellsite geologist on phone: Not on file     Gets together: Not on file     Attends religious service: Not on file     Active member of club or organization: Not on file     Attends meetings of clubs or organizations: Not on file     Relationship status: Not on file   Other Topics Concern   ??? Not on file   Social History Narrative   ??? Not on file       Family History:  Family History   Problem Relation Age of Onset   ??? Hypertension Father    ??? Kidney disease Father    ??? Polycystic kidney disease Father    ??? No Known Problems Mother    ??? Prostate cancer Neg Hx    ??? GU problems Neg Hx        Review of Systems:  10 systems reviewed and are negative unless otherwise mentioned in HPI    Labs/Studies:  Labs and Studies from the last 24hrs per EMR and Reviewed    Physical Exam:  Temp:  [37 ??C-37.5 ??C] 37 ??C  Heart Rate:  [83-101] 88  SpO2 Pulse:  [72-96] 89  Resp:  [20-36] 20  BP: (104-139)/(67-84) 114/81  SpO2:  [93 %-100 %] 93 %    Gen: NAD  HEENT: Atraumatic, normocephalic, nares clear anteriorly, sclera anicteric  CV: RRR, no m/r/g, S1/S2 normal  Pulm: CTAB  Abd: NABS, soft, mild tenderness to palpation in left flank  Ext: Warm, well perfused, no edema  Neuro: Grossly no neurological deficits  Psych: Appropriate mood and affect

## 2018-12-10 NOTE — Unmapped (Addendum)
Ethan West is a 43 y.o. 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 Hospital course listed below in problem-based format.   ??  Fevers, dyspnea and L flank pressure in setting of ADPCKD s/p LDKT: Patient had been afebrile for the past two days with persistent dyspnea. Left flank pain is now reported a pressure and patient continues to report coke colored urine. CT chest / VQ scan was negative, and recent stress test echo recently which was normal with acceptable ejection fraction. CT Chest without contrast showed clear lungs w/o airspace disease or pleural effusion(11/17). Transplant nephrology, surgery, and infectious disease was consulted. Thought of dyspnea with not clear other source is likely due to ongoing anemia. Transplant nephrology recommended EPO, and iron infusions. He was started on IV iron infusion, with hopes to continue outpatient. Regarding his fevers, had not fevered inpatient. He was started on cefepime on admission, and transitioned to ceftriaxone 11/17, per ICID recs. Urine w/micro regarding mixed GU flora and consider MDR screen. All other infectious workup has been??negative.     Hematuria due to renal cyst rupture: This has been happening 2-3 times per year. Ultrasound notable for perinephric hematoma as well. May be contributing to patient's progressive anemia as well.    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 Continued myfortic and prograf, based off levels.  ??  HTN: home amlodipine continued

## 2018-12-10 NOTE — Unmapped (Signed)
Tacrolimus Therapeutic Monitoring Pharmacy Note    Ethan West is a 44 y.o. male continuing tacrolimus.     Indication: Kidney transplant     Date of Transplant: 06/25/2018      Prior Dosing Information: Home regimen 2mg  BID     Goals:  Therapeutic Drug Levels  Tacrolimus trough goal: 8-10 ng/mL    Additional Clinical Monitoring/Outcomes  ?? Monitor renal function (SCr and urine output) and liver function (LFTs)  ?? Monitor for signs/symptoms of adverse events (e.g., hyperglycemia, hyperkalemia, hypomagnesemia, hypertension, headache, tremor)    Results:   Tacrolimus level: 7.4 ng/mL. Unsure of when last dose was taken prior to hospitalization    Pharmacokinetic Considerations and Significant Drug Interactions:  ? Concurrent hepatotoxic medications: None identified  ? Concurrent CYP3A4 substrates/inhibitors: None identified  ? Concurrent nephrotoxic medications: None identified    Assessment/Plan:  Recommendedation(s)  ? Continue current regimen of 2mg  BID    Follow-up  ? Next level has been ordered on 12/11/2018 prior to morning 0800 dose to ensure time of last dose prior to level.   ? A pharmacist will continue to monitor and recommend levels as appropriate    Please page service pharmacist with questions/clarifications.    Jolene Schimke, PharmD Candidate     Phoebe Perch, PharmD, BCPS  Clinical Pharmacist  PG: [phone]

## 2018-12-10 NOTE — Unmapped (Signed)
[facility]  Emergency Department Provider Note      ED Clinical Impression     Final diagnoses:   Pyelonephritis (Primary)   Hematuria, unspecified type       Initial Impression, ED Course, Assessment and Plan     Impression: Ethan West is a 43 y.o. 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    On initial presentation, patient overall nontoxic-appearing, in no acute distress.  Vital signs significant for tachycardia with heart rate 101, low-grade temperature 99.5.    Differential diagnoses include acute renal failure, electrolyte abnormalities, urinary tract infection, transplant rejection, pyelonephritis, Covid infection, pneumonia.    Will obtain labs including CBC, CMP, blood cultures, urinalysis, chest x-ray.  Will start patient on broad-spectrum antibiotics for empiric coverage.    Chart review significant for normal stress echo 11/28/2018.    Urinalysis showed proteinuria, hematuria, pyuria.  In general appearance, the urine appeared cola colored.    Laboratory analysis otherwise significant for mild leukopenia with WBC 3.3, anemia with hemoglobin 9.0 (down 2 g from hemoglobin 11.2 on 11/27/2018), normal platelets, creatinine approximately at baseline at 1.93, D-dimer 526.    On my interpretation, chest x-ray is overall normal appearing, with normal cardiac silhouette, no focal consolidations or increased opacities, overall lung spaces without acute changes.    Renal transplant ultrasound showed patchy perfusion, stable to slightly increased resistive indices with elevation at the main renal artery anastomosis, new from prior, mild hydronephrosis with urothelial thickening, suspicious for possible superimposed infection. Given patient's kidney function, CTA chest was unobtainable.  Nuclear medicine perfusion study was ordered for further evaluation of possible pulmonary embolism.  Renal ultrasound was pending at the time of admission.    We will page MAO for admission.    ED Triage Vitals [12/09/18 1604]   Vital Signs Group      Temp 37.5 ??C (99.5 ??F)      Temp Source Oral      Heart Rate 101      SpO2 Pulse       Heart Rate Source Monitor      Resp 22      BP 104/84      MAP (mmHg) 92      BP Location Right arm      BP Method Automatic      Patient Position Sitting   SpO2 98 %            Additional Medical Decision Making     I have reviewed the vital signs and the nursing notes. Labs and radiology results that were available during my care of the patient were independently reviewed by me and considered in my medical decision making.     Case discussed with ED attending Dr. Pincus Sanes who is agreeable with the plan.    ____________________________________________       History     Chief Complaint  Fever Immunocompromised      HPI Ethan West is a 43 y.o. 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  Patient explains that over the past 2 weeks or so has not been feeling well with left-sided abdominal pain starting about 2 weeks ago which she was seen at  [facility] emergency department for and found to have no acute abnormalities and was sent home with plan for outpatient follow-up.  Patient reports that he has noted worsening exertional dyspnea over the past 2 weeks and says that doing simple things like getting up to walk to the bathroom or kitchen caused him to be short of breath.  Patient reports history of similar symptoms when he was recently postop from kidney transplant.  Patient denies any cough, hemoptysis, history of DVT/PE, nausea, vomiting, diarrhea.  Patient does report that since for the past 3 weeks or so, he has noted hematuria.  Patient reports history of hematuria in the past with rupturing kidney cysts but he states that normally his symptoms only last for about 2 weeks.  Patient also reports that his pain associated with ruptured kidney stones typically resolve with resolution of hematuria but states that his pain is persistent as well. Per review, the patient presented to [facility] ED on 11/27/2018 for left lateral abdominal pain radiating to his chest, reminding him of a previous renal cyst rupture. Kidney ultrasound notable for patchy perfusion, but resistive indices were stable. CT A/P without contrast notable for Small volume peripancreatic free fluid layering along the left perirenal space, but otherwise unremarkable. UA showed increased proteins, large blood, increased RBC, and few bacteria. Lipase, d-dimer, CXR, EKG, and troponin all returned negative. Patient discharged with nephrologist follow-up. Patient presented to nephrology on 11/28/2018 for regular Feraheme infusion.     Review of Systems:  Constitutional: + fever.  Eyes: no visual changes.  ENT: no sore throat.  Cardiovascular: no chest pain.  Respiratory: + shortness of breath.  Gastrointestinal: no abdominal pain, no vomiting, no diarrhea.  Genitourinary: + dysuria, + hematuria  Musculoskeletal: no back pain.  Skin: no rash.  Neurological: no headaches, no focal weakness or numbness.    Past Medical History:   Diagnosis Date   ??? ADPKD (autosomal dominant polycystic kidney disease)    ??? Hypertension    ??? Kidney stone    ??? Polycystic kidney disease        Patient Active Problem List   Diagnosis   ??? Autosomal dominant polycystic kidney disease   ??? Urinary frequency   ??? Kidney stone   ??? Chronic kidney disease (CKD), stage V (CMS-HCC)   ??? Bruising   ??? Inguinal hernia, right   ??? Essential hypertension   ??? ADPKD (autosomal dominant polycystic kidney disease)   ??? Metabolic acidosis   ??? Secondary hyperparathyroidism (CMS-HCC)   ??? Kidney transplant status, living related donor   ??? Anemia in CKD (chronic kidney disease)   ??? 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 Olecranon bursitis of right elbow       Past Surgical History:   Procedure Laterality Date   ??? APPENDECTOMY     ??? EXPLORATORY LAPAROTOMY     ??? NEPHRECTOMY TRANSPLANTED ORGAN ??? PR AV ANAST,UP ARM BASILIC VEIN TRANSPOSIT Left 1/61/0960    Procedure: ARTERIOVENOUS ANASTOMOSIS, OPEN; BY UPPER ARM BASILIC VEIN TRANSPOSITION;  Surgeon: Leona Carry, MD;  Location: MAIN OR [facility];  Service: Transplant   ??? PR CREAT AV FISTULA,AUTOGENOUS GRAFT Left 12/05/2017    Procedure: CREATE AV FISTULA (SEPART PROC); AUTOG GFT, UPPER EXTREMITY;  Surgeon: Leona Carry, MD;  Location: MAIN OR [facility];  Service: Transplant   ??? PR TRANSPLANT,PREP CADAVER RENAL GRAFT N/A 06/25/2018    Procedure: BACKBNCH STD PREP CAD DONR RENAL ALLOGFT PRIOR TO TRNSPLNT,  INCL DISSEC/REM PERINEPH FAT, DIAPH/RTPER ATTAC;  Surgeon: Leona Carry, MD;  Location: MAIN OR [facility];  Service: Transplant   ??? PR TRANSPLANTATION OF KIDNEY N/A 06/25/2018    Procedure: Priority RENAL ALLOTRANSPLANTATION, IMPLANTATION OF GRAFT; WITHOUT RECIPIENT NEPHRECTOMY;  Surgeon: Leona Carry, MD;  Location: MAIN OR [facility];  Service: Transplant         Current Facility-Administered Medications:   ???  acetaminophen (TYLENOL) tablet 650 mg, 650 mg, Oral, Q6H PRN, Radene Gunning, MD  ???  amLODIPine (NORVASC) tablet 10 mg, 10 mg, Oral, Daily, Radene Gunning, MD, 10 mg at 12/11/18 0853  ???  cefTRIAXone (ROCEPHIN) 1 g in sodium chloride 0.9 % (NS) 100 mL IVPB-connector bag, 1 g, Intravenous, Q24H, [name], MD, Last Rate: 200 mL/hr at 12/11/18 0854, 1 g at 12/11/18 0854  ???  cholecalciferol (vitamin D3) tablet 2,000 Units, 2,000 Units, Oral, Daily, Radene Gunning, MD, 2,000 Units at 12/11/18 [phone]  ???  enoxaparin (LOVENOX) syringe 40 mg, 40 mg, Subcutaneous, Q24H SCH, [name], MD, 40 mg at 12/10/18 2048  ???  escitalopram oxalate (LEXAPRO) tablet 10 mg, 10 mg, Oral, Daily, Radene Gunning, MD, 10 mg at 12/11/18 9604  ???  ferric gluconate (FERRLECIT) 250 mg in sodium chloride (NS) 0.9 % 100 mL IVPB, 250 mg, Intravenous, Q12H SCH, Cherie Dark, DO ???  mycophenolate (MYFORTIC) EC tablet 540 mg, 540 mg, Oral, BID, Radene Gunning, MD, 540 mg at 12/11/18 5409  ???  tacrolimus (PROGRAF) capsule 2 mg, 2 mg, Oral, BID, Radene Gunning, MD, 2 mg at 12/11/18 8119    Allergies  Benazepril, Clarithromycin, and Penicillins    Family History   Problem Relation Age of Onset   ??? Hypertension Father    ??? Kidney disease Father    ??? Polycystic kidney disease Father    ??? No Known Problems Mother    ??? Prostate cancer Neg Hx    ??? GU problems Neg Hx        Social History  Social History     Tobacco Use   ??? Smoking status: Former Smoker     Types: Cigarettes     Quit date: 06/22/2001     Years since quitting: 17.4   ??? Smokeless tobacco: Former Neurosurgeon     Types: Chew     Quit date: 06/23/2015   Substance Use Topics   ??? Alcohol use: No     Alcohol/week: 0.0 standard drinks   ??? Drug use: No         Physical Exam     ED Triage Vitals [12/09/18 1604]   Enc Vitals Group      BP 104/84      Heart Rate 101      SpO2 Pulse       Resp 22      Temp 37.5 ??C (99.5 ??F)      Temp Source Oral      SpO2 98 %      Weight       Height       Head Circumference       Peak Flow       Pain Score       Pain Loc       Pain Edu?       Excl. in GC?        Constitutional: Alert and oriented. Well appearing. In no distress.  Eyes: Conjunctivae are normal.  ENT  Head: Normocephalic and atraumatic.       Nose: No congestion.       Mouth/Throat: Mucous membranes are moist.       Neck: No stridor.  Hematological/Lymphatic/Immunilogical: No cervical lymphadenopathy.  Cardiovascular: rate as vital signs, regular rhythm. Normal and symmetric distal pulses are present in all extremities.  Respiratory: Normal respiratory effort, symmetrical expansion of chest. No wheezes, no rales.  Gastrointestinal: Soft and nontender. No rigid abdomen present.  Genitourinary: Deferred  Musculoskeletal: No tenderness or edema of bilateral LEs. Neurologic: Normal speech and language. No gross focal neurologic deficits are appreciated.  Skin: Skin is warm, dry and intact. No rash noted.  Psychiatric: Mood and affect are normal. Speech and behavior are normal.    Radiology     NM Lung Scan Perfusion Imaging Only   Final Result   No wedge-shaped perfusion defects to suggest presence of pulmonary embolism.      CT Chest Wo Contrast   Final Result      Clear lungs. No airspace disease or pleural effusion.      US Renal Complete   Final Result   -Bilateral large polycystic kidneys. Smaller subcentimeter lesions are too small to characterize. No definite solid renal masses identified, however, cannot be excluded.   -Heterogeneous area in the upper pole of the left kidney, likely represent previously seen hematoma/perinephric hematoma, similar to that seen on CT abdomen pelvis from 11/27/2018. Recommend follow-up to resolution.      Please see below for data measurements:   Right kidney: 20.5 cm   Left kidney:  21.3 cm      Bladder volume prevoid: 500.24  mL    Bladder volume postvoid: 0  mL       ++++++++++++++++++++      The findings of this study were discussed via telephone with DR. Williams Che by Dr. Vista Mink  on 12/10/2018 12:40 AM.        US Renal Transplant W Doppler   Final Result   1. Patchy perfusion throughout the renal transplant.   2. Stable to slightly increased resistive indices with elevation of the RI at the main renal artery anastomosis, new from prior.   3. Mild hydronephrosis with urothelial thickening. Correlation with urinalysis is recommended to exclude superimposed infection.      Please see below for data measurements:      Renal Transplant: length 11.22 cm; width 4.66 cm; height 5.93 cm      Arcuate artery superior resistive index: 0.68   Arcuate artery mid resistive index: 0.65   Arcuate artery inferior resistive index: 0.70      Previous resistive indices range of arcuate arteries: .63-.71 Segmental artery superior resistive index: 0.77   Segmental artery mid resistive index: 0.75   Segmental artery inferior resistive index: 0.80      Previous resistive indices range of segmental arteries: .66-.71      Main renal artery hilum resistive index: 0.74   Main renal artery mid resistive index: 0.78   Main renal artery anastomosis resistive index: 0.85      Previous resistive indices range of main renal artery: .68-.75      Main renal vein: patent      Iliac artery: Patent   Iliac vein: Patent      Bladder volume prevoid: 46.3  mL          XR Chest Portable   Final Result      Mildly decreased lung volumes without acute airspace disease.  Pertinent labs & imaging results that were available during my care of the patient were reviewed by me and considered in my medical decision making (see chart for details).     Please note- This chart has been created using [employer]. Chart creation errors have been sought, but may not always be located and such creation errors, especially pronoun confusion, do NOT reflect on the standard of medical care.       Documentation assistance was provided by Beola Cord, Scribe on December 09, 2018 at 5:08 PM for Halford Chessman, DO.    I attest that I have reviewed the note and that the components of the history of the present illness, the physical exam, and the assessment and plan documented were performed by me or were performed in my presence by the student where I verified the documentation and performed (or re-performed) the exam and medical decision making.        Dene Gentry, MD  Resident  12/11/18 [phone]

## 2018-12-10 NOTE — Unmapped (Signed)
referred here by transplant coordinator due to severe pain in left side, difficulty urinating with blood noted in urine, difficulty breathing and fevers. t max 101.61f last night.

## 2018-12-11 LAB — PHOSPHORUS: Phosphate:MCnc:Pt:Ser/Plas:Qn:: 3.2

## 2018-12-11 LAB — BASIC METABOLIC PANEL
ANION GAP: 7 mmol/L (ref 7–15)
BLOOD UREA NITROGEN: 18 mg/dL (ref 7–21)
BUN / CREAT RATIO: 10
CALCIUM: 9 mg/dL (ref 8.5–10.2)
CHLORIDE: 108 mmol/L — ABNORMAL HIGH (ref 98–107)
CO2: 23 mmol/L (ref 22.0–30.0)
CREATININE: 1.72 mg/dL — ABNORMAL HIGH (ref 0.70–1.30)
EGFR CKD-EPI NON-AA MALE: 48 mL/min/{1.73_m2} — ABNORMAL LOW (ref >=60)
POTASSIUM: 5 mmol/L (ref 3.5–5.0)
SODIUM: 138 mmol/L (ref 135–145)

## 2018-12-11 LAB — ADENOVIRUS PCR, BLOOD: Adenovirus DNA:PrThr:Pt:Ser/Plas:Ord:Probe.amp.tar: NEGATIVE

## 2018-12-11 LAB — MAGNESIUM: Magnesium:MCnc:Pt:Ser/Plas:Qn:: 1.9

## 2018-12-11 LAB — TACROLIMUS, TROUGH: Lab: 9.3

## 2018-12-11 LAB — EGFR CKD-EPI NON-AA MALE: Lab: 48 — ABNORMAL LOW

## 2018-12-11 LAB — CBC
HEMATOCRIT: 29.1 % — ABNORMAL LOW (ref 41.0–53.0)
HEMOGLOBIN: 8.9 g/dL — ABNORMAL LOW (ref 13.5–17.5)
MEAN CORPUSCULAR HEMOGLOBIN CONC: 30.5 g/dL — ABNORMAL LOW (ref 31.0–37.0)
MEAN CORPUSCULAR HEMOGLOBIN: 24.5 pg — ABNORMAL LOW (ref 26.0–34.0)
MEAN CORPUSCULAR VOLUME: 80.4 fL (ref 80.0–100.0)
MEAN PLATELET VOLUME: 8.6 fL (ref 7.0–10.0)
PLATELET COUNT: 335 10*9/L (ref 150–440)
RED BLOOD CELL COUNT: 3.62 10*12/L — ABNORMAL LOW (ref 4.50–5.90)
RED CELL DISTRIBUTION WIDTH: 15.4 % — ABNORMAL HIGH (ref 12.0–15.0)

## 2018-12-11 LAB — HEMATOCRIT: Hematocrit:VFr:Pt:Bld:Qn:: 29.1 — ABNORMAL LOW

## 2018-12-11 NOTE — Unmapped (Signed)
Pt admitted with dyspnea, L flank pain and fever (polynephritis)  Pt alert oriented x4, VSS, Afebrile. Pt receiving IV antibx as ordered. Pt received his anti rejection meds at 9pm per pt request. No complaints of pain, lovenox given for VTE. Pt updated on POC and he had no questions at this time. Pt resting quietly this shift.   Problem: Adult Inpatient Plan of Care  Goal: Plan of Care Review  12/11/2018 0140 by Valeta Harms, RN  Outcome: Progressing  Goal: Patient-Specific Goal (Individualization)  12/11/2018 0140 by Valeta Harms, RN  Outcome: Progressing  Flowsheets (Taken 12/11/2018 0138)  Patient-Specific Goals (Include Timeframe): Pt will have pain level under 3 this shift.  Individualized Care Needs: IV antibx, labs, safety, antiregection meds, pain control  Anxieties, Fears or Concerns: no concerns voiced today  Goal: Absence of Hospital-Acquired Illness or Injury  12/11/2018 0140 by Valeta Harms, RN  Outcome: Progressing  Goal: Optimal Comfort and Wellbeing  12/11/2018 0140 by Valeta Harms, RN  Outcome: Progressing  Goal: Readiness for Transition of Care  12/11/2018 0140 by Valeta Harms, RN  Outcome: Progressing    Goal: Rounds/Family Conference  12/11/2018 0140 by Valeta Harms, RN  Outcome: Progressing  12/11/2018 0138 by Valeta Harms, RN  Outcome: Progressing     Problem: Pain Acute  Goal: Optimal Pain Control  Outcome: Progressing     Problem: Infection  Goal: Infection Symptom Resolution  Outcome: Progressing

## 2018-12-11 NOTE — Unmapped (Signed)
Pt is A&O X4 VSS.  Pt was able to communicate needs well with staff.  Pt denies pain and discomfort. No falls noted this shift.  IV intact, clean, dry and flushed per protocol.  IV ABX continued with no adverse reactions noted. Pt continues resting in bed  call bell within reach, bed in lowest position. Will cont to monitor.      Problem: Adult Inpatient Plan of Care  Goal: Plan of Care Review  Outcome: Ongoing - Unchanged  Goal: Patient-Specific Goal (Individualization)  Outcome: Ongoing - Unchanged  Flowsheets (Taken 12/11/2018 1156)  Patient-Specific Goals (Include Timeframe): Pt will be free of falls this shift 7a-7p  Individualized Care Needs: IV ABX Labs Safety Antirejection meds Pain control  Anxieties, Fears or Concerns: None voiced  Goal: Absence of Hospital-Acquired Illness or Injury  Outcome: Ongoing - Unchanged  Goal: Optimal Comfort and Wellbeing  Outcome: Ongoing - Unchanged  Goal: Readiness for Transition of Care  Outcome: Ongoing - Unchanged  Goal: Rounds/Family Conference  Outcome: Ongoing - Unchanged     Problem: Pain Acute  Goal: Optimal Pain Control  Outcome: Ongoing - Unchanged     Problem: Infection  Goal: Infection Symptom Resolution  Outcome: Ongoing - Unchanged

## 2018-12-11 NOTE — Unmapped (Signed)
Internal Medicine (MDW) Progress Note    Subjective:   No acute events overnight.  --Afebrile  --Started IV iron per Nephro rec  --EPO pending iron panel and response  --f/u ICID; IV iron in setting of possible infection  --f/u blood tacrolimus; per Renal Transplant  --cont. apreciate recs from Nephro Trans & ICID      Assessment & Plan:   Ethan West is a 43 y.o. 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    Active Problems:    * No active hospital problems. *  Resolved Problems:    * No resolved hospital problems. *      #Fevers, dyspnea and L flank pressure: Patient has been afebrile for the past two days with persistent dyspnea. L flank pain is now reported a pressure and patient continues to report coke colored urine. CT chest / VQ scan was negative reducing likely hood of PE(11/17). Had stress test echo recently which was normal and good EF. CT Chest wo contrast showed clear lungs w/o airspace disease or pleural effusion(11/17). Patient dyspnea could be 2/2  ongoing anemia since Tx surgery, iron stores have been low. Nephrology is currently following and recommends iron panel, EPO, daily tac lvls, and monitor blood culture. Surg Transplant peripherally monitoring, as patient is >30 days s/p transplant and currently non surgical concern. Infection is certainly on the differential, specifically transplant pyelo given inflammation noted on renal tx ultrasound. Pending virology studies. ICID recommend stopping cefepime, start ceftriaxone 1g IV daily and speaking with micro regarding mixed GU flora and consider MDR screen.   --CTM   --f/u virology as above  --Start IV iron per Nephro rec; pending rec from ICID  --EPO pending iron panel and response  --f/u ICID; IV iron in setting of possible infection  --f/u blood tacrolimus; per Renal Transplant  --Histo Urine Ag --discontinue cefepime, start ceftriaxone 1g IV daily  --dailyTac level  --f/u BCx  --cont. apreciate recs from Nephro Trans & ICID    Hematuria: Suspect 2/2 renal cyst ruptures as patient reports this happening 2-3 times per year. Ultrasound notable for perinephric hematoma as well. May be contributing to patient's progressive anemia as well.  - Trend Hb  ??  #ADPCKD s/p LDKT 06/2018 presenting with fevers and a dirty urinalysis. Concerning for infection in graft or elsewhere (see above). Baseline creatinine seems to be ~1.7    #HTN:  --Amlodipine 10 mg daily    Daily Checklist:  Diet: Regular Diet  DVT PPx: Lovenox 40mg  q24h  Electrolytes: No Repletion Needed  Code Status: Full Code  Dispo: Admit to Med W        Objective:   Temp:  [36.7 ??C (98.1 ??F)-37.1 ??C (98.8 ??F)] 37.1 ??C (98.8 ??F)  Heart Rate:  [80-93] 80  Resp:  [16-18] 16  BP: (128-147)/(74-95) 137/80  SpO2:  [97 %-100 %] 100 %    Gen: WDWN  in NAD, alert, oriented, answers questions appropriately  HEENT: atraumatic, sclera anicteric, MMM. OP w/o erythema or exudate   Heart: RRR, S1, S2, no M/R/G, no chest wall tenderness  Lungs: CTAB, no crackles or wheezes, no use of accessory muscles  Abdomen: Normoactive bowel sounds, soft, ND w/ L flank discomfort, no rebound/guarding  Extremities: no clubbing, cyanosis, or edema  Psych: Appropriate mood and affect    Labs/Studies: Labs and Studies from the last 24hrs per EMR and Reviewed  [name]    I attest that I have reviewed the student note and that the components of the history of the present illness, the physical exam, and the assessment and plan documented were performed by me or were performed in my presence by the student where I verified the documentation and performed (or re-performed) the exam and medical decision making.   [name]

## 2018-12-11 NOTE — Unmapped (Signed)
NEW IMMUNOCOMPROMISED HOST INFECTIOUS DISEASE CONSULT NOTE      Ethan West is being seen in consultation at the request of Ethan West, * for evaluation of fever in immunocompromised host.     Assessment/Recommendations:    Ethan West is a 43 y.o. 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 Ruptured cyst  -ddx: anemia/infection related to hematoma from native kidney vs. Transplant pyelo vs. Viral reactivation (CMV) vs new viral (Parvo/BK/Adeno) vs. Zoonotic/Tickborne (very unlikely) vs.  Fungal infection (histo possibly?)  -12/09/18 CXR- no airspace disease; CT chest without contrast 12/10/18 with small GGO in RLL however no nodules  -12/09/18 US Renal c/f mild hydronephrosis in allograft/pyelo? And native L kidney with large, unchanged hematoma    Recommendations:  -agree with CMV, EBV VL  -also recommend HSV, Parvo, Adeno serum PCR  -please send BK PCR from urine or decoy cells (can discuss with nephrology)  -please send histo Urine Ag  -recommend stopping cefepime, start ceftriaxone 1g IV daily  -hold on zoonotic workup unless patient not improving  -speak with micro in am regarding mixed GU flora and consider MDR screen  -f/u Viral studies as above    The [facility] ID service will continue to follow.  Please page the ID Transplant/Liquid Oncology Fellow consult at [phone] with questions.  Patient discussed with Dr. Reynold Bowen History of Present Illness:      Source of information includes:  Electronic Medical Records.  History obtained from:patient.    Mr. Ethan West is a 43 yo gentleman with APCKD s/p living donor transplant in 06/2018 (mom was donor) with subacute, persistent dyspnea now with flank pain and fevers. ID consulted for management of workup.    Per patient very dyspneic following transplant but healed well. Off Valcyte in 3 months. Was doing well until end of September when feeling short of breath, noted to be iron deficient and started on iron infusions with some improvement. Felt well enough to start hunting again with daughter (deer) but denies injuries or deer cleaning.    However in early November had sudden left flank pain with shortness of breath and a chest tightness and went to ER on 11/27/18 and thought to have cyst rupture. Described blood in urine (full stream) that eventually got better albeit dark amber urine intermittently since and had poor PO at that time 2/2 pain/meds. Also significant fatigue and what patient describes as night sweats  Since then.     Patient now with new fevers for 48 hours without other new symptoms. Still having dark urine which is concerning to patient as previous cyst ruptures have only lasted a few weeks. At home fevers up to 101. In ER not present however  101 HR; low grade temp 99.5 howver low BP for size (104/84) and tachycardia along with labs c/w AKI (baseline Cr 1.77) however normal lactate  and noted decrease in Hb from baseline (normally 11-12, today 8.8 after hydration). He denies headaches, sore throat, nasal drip, mouth ulcers, skin changes, chest pain, angina, orthopnea, LE edema; does endorse now only able to walk through 1/2 of house without having to stop to catch breath; denies emesis/nausea or diarrhea; abdominal pain is left sided/flank and some mild ttp over allograft--feels like he is bloated and he is intermittently getting stabbbed in side. No dysuria, penile discharge, blood in stool, myalgias; baseline knee and ankle pain unchanged and no new joint pain.     Started on Cefepime in ER, Urine culture grew mixed GU flora, 12/09/18 blood cultures NGTD. Urinalysis with >100 RBC, >50 WBC and 500 protein with no squamous cells. Team has sent EBV, CMV VL; Per request this am they sent full RPP which was negative and crypto Ag that have returned negative. Had imaging of both native and transplant kidneys- with mild hydronephrosis and urothelial thickening in allograft c/f transplant pyelonephritis; native left kidney with heterogenous area 8 x 6 x 4 cm representing previously seen perinephric hematoma from 11/27/18 CT abdomen pelvis.        Allergies:  Allergies   Allergen Reactions   ??? Benazepril Rash   ??? Clarithromycin Rash   ??? Penicillins Rash     Tolerates cefepime and cefazolin.       Medications:   Current antibiotics:  Cefepime 2 g IV q12 Estimated Creatinine Clearance: 62.5 mL/min (A) (based on SCr of 1.9 mg/dL (H)).      Previous antibiotics:  Vanc/cefepime in ER    Current/Prior immunomodulators:  MMF/Tacro    Other medications reviewed.     Medical History:  Past Medical History:   Diagnosis Date   ??? ADPKD (autosomal dominant polycystic kidney disease)    ??? Hypertension    ??? Kidney stone    ??? Polycystic kidney disease        Surgical History:  Past Surgical History:   Procedure Laterality Date   ??? APPENDECTOMY     ??? EXPLORATORY LAPAROTOMY     ??? NEPHRECTOMY TRANSPLANTED ORGAN     ??? PR AV ANAST,UP ARM BASILIC VEIN TRANSPOSIT Left 1/61/0960 Procedure: ARTERIOVENOUS ANASTOMOSIS, OPEN; BY UPPER ARM BASILIC VEIN TRANSPOSITION;  Surgeon: Leona Carry, MD;  Location: MAIN OR [facility];  Service: Transplant   ??? PR CREAT AV FISTULA,AUTOGENOUS GRAFT Left 12/05/2017    Procedure: CREATE AV FISTULA (SEPART PROC); AUTOG GFT, UPPER EXTREMITY;  Surgeon: Leona Carry, MD;  Location: MAIN OR [facility];  Service: Transplant   ??? PR TRANSPLANT,PREP CADAVER RENAL GRAFT N/A 06/25/2018    Procedure: [facility] STD PREP CAD DONR RENAL ALLOGFT PRIOR TO TRNSPLNT, INCL DISSEC/REM PERINEPH FAT, DIAPH/RTPER ATTAC;  Surgeon: Leona Carry, MD;  Location: MAIN OR [facility];  Service: Transplant   ??? PR TRANSPLANTATION OF KIDNEY N/A 06/25/2018    Procedure: Priority RENAL ALLOTRANSPLANTATION, IMPLANTATION OF GRAFT; WITHOUT RECIPIENT NEPHRECTOMY;  Surgeon: Leona Carry, MD;  Location: MAIN OR [facility];  Service: Transplant       Social History:  Tobacco use:   reports that he quit smoking about 17 years ago. His smoking use included cigarettes. He quit smokeless tobacco use about 3 years ago.  His smokeless tobacco use included chew.   Alcohol use:    reports no history of alcohol use.   Drug use:    reports no history of drug use.   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 Used to fish, not recently mainly fresh water (no wading). Does mow the law, no gardening. TB exposures:  No known TB exposure and No family history of TB   Sexual history:  Sex with women and No history of STIs   Other significant exposures:  No exposure to well water, No exposure to unpasteurized daily products, No exposure to raw/undercooked foods, No exposure to young children, No exposure to blood products, Never incarcerated and Never homeless     Family History:  no recent sick contacts in family and no history active TB in a family member  Family History   Problem Relation Age of Onset   ??? Hypertension Father    ??? Kidney disease Father    ??? Polycystic kidney disease Father    ??? No Known Problems Mother    ??? Prostate cancer Neg Hx    ??? GU problems Neg Hx        Review of Systems:  All other systems reviewed are negative.          Vital Signs last 24 hours:  Temp:  [36.9 ??C-37.5 ??C] 36.9 ??C  Heart Rate:  [71-101] 71  SpO2 Pulse:  [72-96] 83  Resp:  [18-36] 18  BP: (104-139)/(67-92) 136/92  MAP (mmHg):  [80-100] 94  SpO2:  [93 %-100 %] 99 %    Physical Exam:  Patient Lines/Drains/Airways Status    Active Active Lines, Drains, & Airways     Name:   Placement date:   Placement time:   Site:   Days:    Peripheral IV 12/09/18 Right Antecubital   12/09/18    1715    Antecubital   less than 1    Peripheral IV 12/09/18 Right Wrist   12/09/18    1720    Wrist   less than 1    Arteriovenous Fistula - Vein Graft  Access 12/05/17 1516 Arteriovenous fistula Left Arm   12/05/17    1516    Arm   369              GEN:  looks well, no apparent distress  EYES: sclerae anicteric and non injected and PERRL, EOMI  ZOX:[id number] good and no thrush, leukoplakia or oral lesions  LYMPH:no cervical, supraclavicular, axillary, or inguinal LAD  CV:RRR, no abnormal heart sounds noted and no peripheral edema  PULM:normal work of breathing at rest, CTAB anteriorly and CTAB posteriorly WJ:XBJY significant LUQ and tenderness over allograft; referred pain on palpation of RLQ to LLQ; negative murphy's; otherwise soft and mildly distended  NW:GNFAOZ external male genitalia, circumcised  RECTAL:deferred  SKIN:no petechiae, ecchymoses or obvious rashes on clothed exam  MSK:no vertebral point tenderness, no swollen joints and full neck ROM  NEURO:no tremor noted, facial expression symmetric and moves extremities equally  PSYCH:attentive, appropriate affect, good eye contact, fluent speech    Labs:  Lab Results   Component Value Date    WBC 2.9 (L) 12/10/2018    WBC 3.3 (L) 12/09/2018    WBC 7.2 01/08/2015    WBC 8.5 02/21/2013    HGB 8.8 (L) 12/10/2018    HGB 13.7 01/08/2015    HCT 28.5 (L) 12/10/2018    HCT 40.0 01/08/2015    Platelet 298 12/10/2018    Platelet 200 01/08/2015    Absolute Neutrophils 2.5 12/09/2018    Absolute Lymphocytes 0.3 (L) 12/09/2018    Absolute Eosinophils 0.1 12/09/2018    Sodium 134 (L) 12/10/2018    Sodium 142 01/08/2015    Potassium 4.9 12/10/2018    Potassium 4.1 01/08/2015    BUN 20 12/10/2018    BUN 27 (H) 01/08/2015    Creatinine 1.90 (H) 12/10/2018    Creatinine 1.93 (H) 12/09/2018    Creatinine 3.16 (H) 01/08/2015    Creatinine 2.64 (H) 08/06/2014    Glucose 99 12/10/2018    Magnesium 1.7 12/10/2018    Albumin 3.4 (L) 12/09/2018    Total Bilirubin 0.5 12/09/2018    AST 18 (L) 12/09/2018    ALT 9 12/09/2018    Alkaline Phosphatase 65 12/09/2018    INR 1.12 06/13/2018       Microbiology:  Past cultures were reviewed in Epic and CareEverywhere.    Imaging:  Independent visualization of images: I independently reviewed the image from (11/16-11/17) and I agree with the findings/interpretation.    Serologies:  Lab Results   Component Value Date    CMV IGG Positive (A) 06/13/2018    EBV VCA IgG Antibody Positive (A) 06/13/2018    Hep A IgG Nonreactive 05/07/2017    Hep B Surface Ag Nonreactive 06/13/2018    Hep B S Ab Nonreactive 06/13/2018 Hep B Surf Ab Quant <8.00 06/13/2018    Hepatitis C Ab Nonreactive 06/13/2018    RPR Nonreactive 06/13/2018    HSV 1 IgG Negative 06/13/2018    HSV 2 IgG Positive (A) 06/13/2018    Varicella IgG Positive 06/13/2018    Rubella IgG Scr Positive 05/07/2017    Toxoplasma Gondii IgG Negative 05/07/2017    Quantiferon TB Gold Plus Interpretation Negative 06/13/2018    Quantiferon Mitogen Minus Nil 10.00 06/13/2018    Quantiferon Antigen 1 minus Nil 0.00 06/13/2018       Immunizations:  Immunization History   Administered Date(s) Administered   ??? Influenza Vaccine Quad (IIV4 PF) 92mo+ injectable 10/04/2018   ??? TdaP 12/23/2009

## 2018-12-11 NOTE — Unmapped (Signed)
Pt received from ED. Pt is A&O X4 VSS.  Pt was able to communicate needs well with staff.  Pt denies pain and discomfort. No falls noted this shift.  IV intact, clean, dry and flushed per protocol. Continues resting in bed with wife at bedside , call bell within reach, bed in lowest position. Will cont to monitor.        Problem: Adult Inpatient Plan of Care  Goal: Plan of Care Review  12/10/2018 1701 by Oneita Kras, RN  Outcome: Ongoing - Unchanged  12/10/2018 1700 by Oneita Kras, RN  Outcome: Ongoing - Unchanged  Goal: Patient-Specific Goal (Individualization)  12/10/2018 1701 by Oneita Kras, RN  Outcome: Ongoing - Unchanged  Flowsheets (Taken 12/10/2018 1701)  Patient-Specific Goals (Include Timeframe): Free from falls this shift 7a-7p  Individualized Care Needs: Labs Falls/Safety Precautions VS  Anxieties, Fears or Concerns: None voiced  12/10/2018 1700 by Oneita Kras, RN  Outcome: Ongoing - Unchanged  Goal: Absence of Hospital-Acquired Illness or Injury  12/10/2018 1701 by Oneita Kras, RN  Outcome: Ongoing - Unchanged  12/10/2018 1700 by Oneita Kras, RN  Outcome: Ongoing - Unchanged  Goal: Optimal Comfort and Wellbeing  12/10/2018 1701 by Oneita Kras, RN  Outcome: Ongoing - Unchanged  12/10/2018 1700 by Oneita Kras, RN  Outcome: Ongoing - Unchanged  Goal: Readiness for Transition of Care  12/10/2018 1701 by Oneita Kras, RN  Outcome: Ongoing - Unchanged  12/10/2018 1700 by Oneita Kras, RN  Outcome: Ongoing - Unchanged  Goal: Rounds/Family Conference  12/10/2018 1701 by Oneita Kras, RN  Outcome: Ongoing - Unchanged  12/10/2018 1700 by Oneita Kras, RN  Outcome: Ongoing - Unchanged

## 2018-12-11 NOTE — Unmapped (Signed)
Tacrolimus Therapeutic Monitoring Pharmacy Note    Ethan West is a 43 y.o. male continuing tacrolimus.     Indication: Kidney transplant     Date of Transplant: 06/25/2018      Prior Dosing Information: Current regimen 2mg  BID      Goals:  Therapeutic Drug Levels  Tacrolimus trough goal: 8-10 ng/mL    Additional Clinical Monitoring/Outcomes  ?? Monitor renal function (SCr and urine output) and liver function (LFTs)  ?? Monitor for signs/symptoms of adverse events (e.g., hyperglycemia, hyperkalemia, hypomagnesemia, hypertension, headache, tremor)    Results:   Tacrolimus level: 9.3 ng/mL, drawn 2 hours early    Pharmacokinetic Considerations and Significant Drug Interactions:  ? Concurrent hepatotoxic medications: None identified  ? Concurrent CYP3A4 substrates/inhibitors: None identified  ? Concurrent nephrotoxic medications: None identified    Assessment/Plan:  Recommendedation(s)  ? Continue current regimen of 2mg  BID. Level falls within appropriate range of when level was taken.    Follow-up  ? Next level should be ordered on 12/12/2018 prior to morning 0800 dose per orders from nephrology .   ? A pharmacist will continue to monitor and recommend levels as appropriate    Please page service pharmacist with questions/clarifications.    Ethan West, PharmD Candidate     Ethan West, PharmD, BCPS  Clinical Pharmacist  PG: [phone]

## 2018-12-11 NOTE — Unmapped (Signed)
**THIS PATIENT WAS NOT SEEN IN PERSON TO MINIMIZE POTENTIAL SPREAD OF COVID-19, PROTECT PATIENTS/PROVIDERS, AND REDUCE PPE UTILIZATION.**        Social Work  Psychosocial Assessment    Patient Name: Ethan West   Medical Record Number: [id number]   Date of Birth: Jul 03, 1975  Sex: Male     Referral  Referred by: Care Manager  Reason for Referral: Complex Family Dynamics / Expectations Impacting Discharge  No Psychosocial Interventions Necessary: No Psychosocial Interventions Necessary    Extended Emergency Contact Information  Primary Emergency Contact: [facility]  Address: [address]           [place], [place] [address] [place] Phone: [phone]  Relation: Spouse  Secondary Emergency Contact: Henrietta [place], [place]  Mobile Phone: [phone]  Relation: Relative  Interpreter needed? No    Legal Next of Kin / Guardian / POA / Advance Directives      Advance Directive (Medical Treatment)  Does patient have an advance directive covering medical treatment?: Patient does not have advance directive covering medical treatment.  Reason patient does not have an advance directive covering medical treatment:: Patient does not wish to complete one at this time.    Health Care Decision Maker [HCDM] (Medical & Mental Health Treatment)  Healthcare Decision Maker: HCDM documented in the HCDM/Contact Info section.  Information offered on HCDM, Medical & Mental Health advance directives:: Patient declined information.         Discharge Planning  Discharge Planning Information:   Type of Residence   Mailing Address:  [address]  [place] [address]    Medical Information   Past Medical History:   Diagnosis Date   ??? ADPKD (autosomal dominant polycystic kidney disease)    ??? Hypertension    ??? Kidney stone    ??? Polycystic kidney disease        Past Surgical History:   Procedure Laterality Date   ??? APPENDECTOMY     ??? EXPLORATORY LAPAROTOMY     ??? NEPHRECTOMY TRANSPLANTED ORGAN ??? PR AV ANAST,UP ARM BASILIC VEIN TRANSPOSIT Left 05/06/2438    Procedure: ARTERIOVENOUS ANASTOMOSIS, OPEN; BY UPPER ARM BASILIC VEIN TRANSPOSITION;  Surgeon: Leona Carry, MD;  Location: [facility];  Service: Transplant   ??? PR CREAT AV FISTULA,AUTOGENOUS GRAFT Left 12/05/2017    Procedure: CREATE AV FISTULA (SEPART PROC); AUTOG GFT, UPPER EXTREMITY;  Surgeon: Leona Carry, MD;  Location: MAIN OR [facility];  Service: Transplant   ??? PR TRANSPLANT,PREP CADAVER RENAL GRAFT N/A 06/25/2018    Procedure: [facility] STD PREP CAD DONR RENAL ALLOGFT PRIOR TO TRNSPLNT, INCL DISSEC/REM PERINEPH FAT, DIAPH/RTPER ATTAC;  Surgeon: Leona Carry, MD;  Location: MAIN OR [facility];  Service: Transplant   ??? PR TRANSPLANTATION OF KIDNEY N/A 06/25/2018    Procedure: Priority RENAL ALLOTRANSPLANTATION, IMPLANTATION OF GRAFT; WITHOUT RECIPIENT NEPHRECTOMY;  Surgeon: Leona Carry, MD;  Location: MAIN OR [facility];  Service: Transplant       Family History   Problem Relation Age of Onset   ??? Hypertension Father    ??? Kidney disease Father    ??? Polycystic kidney disease Father    ??? No Known Problems Mother    ??? Prostate cancer Neg Hx    ??? GU problems Neg Hx        Financial Information   Primary Insurance: Payor: MEDCOST / Plan: MEDCOST MBS / Product Type: *No Product type* /  Secondary Insurance: None   Prescription Coverage: Nurse, learning disability (listed above)   Preferred Pharmacy: [id number] [place], [place] - [address]  [facility]  [facility]  [facility] - [place], [place] - [address]    Barriers to taking medication: No    Transition Home   Transportation at time of discharge: Family/Friend's Private Vehicle    Anticipated changes related to Illness: TBD   Services in place prior to admission: N/A   Services anticipated for DC: TBD   Hemodialysis Prior to Admission: No    Readmission Risk of Unplanned Readmission Score: UNPLANNED READMISSION SCORE: 15%  Readmitted Within the Last 30 Days?   Readmission Factors include: other: w/in 1 year of transplant    Social Determinants of Health  Social Determinants of Health were addressed in provider documentation.  Please refer to patient history.    Social History  Support Systems/Concerns: Children, Family Members, Friends/Neighbors, Spouse                          Financial planner: No Field seismologist Affecting Healthcare: none    Medical and Psychiatric History  Psychosocial Stressors: Denies      Psychological Issues/Information: No issues              Chemical Dependency: None              Outpatient Providers: Specialist   Name / Contact #: : [facility]  Legal: No legal issues      Ability to [employer]: No issues accessing community services      **  Spoke w/ pt via phone to complete PSA.  Verified all demographics and ER contacts.  Pt voices no concerns/questions at this time.  States that his supports remain the same and that his wife/Kelly will provide transportation at time of DC.  States that wife works about 5 away [employer] and could be there quickly if needed.    Provided support & empathy.  Pt is aware of how to contact us if needed.  Will continue to follow as needed.    Lowella Petties, LCSW, CCTSW

## 2018-12-11 NOTE — Unmapped (Signed)
Nephrology Consult Note    Requesting Attending Physician:  [name], *  Service Requesting Consult:  Med Candie Echevaria (MDW)    ASSESSMENT:    Ethan West is a 43 y.o. 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 Working dx of cyst rupture and ongoing hematuria, possible UTI and anemia complicated by iron deficiency.      LDKT 06/2018 / baseline around 1.9   Immunosuppression prograf / myfortic / Goal : 7-9  ADPKD with cyst rupture/ hematuria   Anemia: iron def + ongoing loss   SOB/ dyspnea  Fever: rule out UTI     RECOMMENDATIONS:    Patient significant complaint is ongoing pain, hematuria and SOB. For his dyspnea recent work up included CT chest / VQ scan was negative. Had stress test echo recently which was normal and good EF. Patient has been having issues with ongoing anemia since Tx surgery, iron stores has been low. His usual Hb lvl runs in the 12 before the tx and lvls dropping to 9 and 8 is new to him.     - add on iron panel to lab / recently he has been receiving iron infusion but not full dose replaced yet   - if iron sats continue to run lower than 20 proceed with IV ferrelicit 125 BID dosing while in patient / will calculate total number of doses based on   - Will order epo dosing based on iron panel and response/ avoiding PRBC as much as possible due to recent Tx status   - Obtain SRF team consult for the cyst rupture and ongoing hematuria / future consideration for surgical intervention might be needed. 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 He has been having issues with cyst rupture few times a year associated with pain and self resolving hematuria within 2 weeks. He presented to the ED 2 weeks ago with similar complaint and had work up for the main chest pain and sob component. He continued to feel SOB and ongoing hematuria along with new issues of mild fever and low BP made him come to the ED again today.  Patient not demonstrating any symptoms of drug toxicity.no sweats. no palpitations orthopnea . no lightheaded. no lower extremity edema.  no n/v/d. no myalgias or arthralgias. no  difficulty voiding. So far work up including imaging studies and lab works point towards a adpkd cyst rupture with ongoing symptomatic anemia and negative for any infiltrate in lung, PE or cardiac events. Transplant nephrology consulted for ongoing management of the renal transplant and immunosuppression.     Allergies:  Allergies   Allergen Reactions   ??? Benazepril Rash   ??? Clarithromycin Rash   ??? Penicillins Rash     Tolerates cefepime and cefazolin.  Medications:   Prior to Admission medications    Medication Sig Start Date End Date Taking? Authorizing Provider   albuterol HFA 90 mcg/actuation inhaler 2 puffs every 4-6 hours with spacer  Patient taking differently: Inhale 2 puffs every four (4) hours as needed for wheezing or shortness of breath. with spacer 12/02/18   Pankaj Jawa, MD   amLODIPine (NORVASC) 10 MG tablet Take 1 tablet (10 mg total) by mouth daily. 07/05/18 07/05/19  Trevor Iha, DO   aspirin (ECOTRIN) 81 MG tablet Take 1 tablet (81 mg total) by mouth daily. 06/27/18 06/27/19  Drake Leach, PA   carvediloL (COREG) 6.25 MG tablet Take 1 tablet (6.25 mg total) by mouth Two (2) times a day. 07/16/18 07/16/19  Leona Carry, MD cholecalciferol, vitamin D3, 50 mcg (2,000 unit) tablet Take 1 tablet (2,000 Units total) by mouth daily. 07/16/18   Leona Carry, MD   escitalopram oxalate (LEXAPRO) 10 MG tablet Take 1 tablet (10 mg total) by mouth daily. 08/28/18 01/01/19  Leeroy Bock, MD   inhalational spacing device (AEROCHAMBER MV) Spcr 1 each by Miscellaneous route 6XD PRN. 12/02/18   Leeroy Bock, MD   mycophenolate (MYFORTIC) 180 MG EC tablet Take 3 tablets (540 mg total) by mouth Two (2) times a day. 07/22/18 07/22/19  Leona Carry, MD   sulfamethoxazole-trimethoprim (BACTRIM) 400-80 mg per tablet Take 1 tablet (80 mg of trimethoprim total) by mouth Every Monday, Wednesday, and Friday. 06/28/18 12/25/18  Drake Leach, PA   tacrolimus (PROGRAF) 1 MG capsule Take 2 capsules in the morning and take 2 capsules at night 10/07/18   Leeroy Bock, MD       Current Facility-Administered Medications   Medication Dose Route Frequency Provider Last Rate Last Dose   ??? acetaminophen (TYLENOL) tablet 650 mg  650 mg Oral Q6H PRN Radene Gunning, MD       ??? amLODIPine (NORVASC) tablet 10 mg  10 mg Oral Daily Radene Gunning, MD   10 mg at 12/10/18 0825   ??? cefepime (MAXIPIME) 2 g in dextrose 100 mL IVPB (premix)  2 g Intravenous Q12H Radene Gunning, MD 200 mL/hr at 12/10/18 0825 2 g at 12/10/18 0825   ??? cholecalciferol (vitamin D3) tablet 2,000 Units  2,000 Units Oral Daily Radene Gunning, MD   2,000 Units at 12/10/18 0825   ??? enoxaparin (LOVENOX) syringe 40 mg  40 mg Subcutaneous Q24H SCH [name], MD       ??? escitalopram oxalate (LEXAPRO) tablet 10 mg  10 mg Oral Daily Radene Gunning, MD   10 mg at 12/10/18 0825   ??? mycophenolate (MYFORTIC) EC tablet 540 mg  540 mg Oral BID Radene Gunning, MD   540 mg at 12/10/18 0840   ??? tacrolimus (PROGRAF) capsule 2 mg  2 mg Oral BID Radene Gunning, MD   2 mg at 12/10/18 0825       Medical History:  Past Medical History:   Diagnosis Date ??? ADPKD (autosomal dominant polycystic kidney disease)    ??? Hypertension    ??? Kidney stone    ??? Polycystic kidney disease        Surgical History:  Past Surgical History:   Procedure Laterality Date   ??? APPENDECTOMY     ??? EXPLORATORY LAPAROTOMY     ??? NEPHRECTOMY TRANSPLANTED ORGAN     ??? PR AV ANAST,UP ARM BASILIC VEIN TRANSPOSIT Left 1/61/0960    Procedure: ARTERIOVENOUS  ANASTOMOSIS, OPEN; BY UPPER ARM BASILIC VEIN TRANSPOSITION;  Surgeon: Leona Carry, MD;  Location: MAIN OR [facility];  Service: Transplant   ??? PR CREAT AV FISTULA,AUTOGENOUS GRAFT Left 12/05/2017    Procedure: CREATE AV FISTULA (SEPART PROC); AUTOG GFT, UPPER EXTREMITY;  Surgeon: Leona Carry, MD;  Location: MAIN OR [facility];  Service: Transplant   ??? PR TRANSPLANT,PREP CADAVER RENAL GRAFT N/A 06/25/2018    Procedure: [facility] STD PREP CAD DONR RENAL ALLOGFT PRIOR TO TRNSPLNT, INCL DISSEC/REM PERINEPH FAT, DIAPH/RTPER ATTAC;  Surgeon: Leona Carry, MD;  Location: MAIN OR [facility];  Service: Transplant   ??? PR TRANSPLANTATION OF KIDNEY N/A 06/25/2018    Procedure: Priority RENAL ALLOTRANSPLANTATION, IMPLANTATION OF GRAFT; WITHOUT RECIPIENT NEPHRECTOMY;  Surgeon: Leona Carry, MD;  Location: MAIN OR [facility];  Service: Transplant         Social History:  Tobacco use: denies  Alcohol use: denies  Drug use: denies      Family History:  Family History   Problem Relation Age of Onset   ??? Hypertension Father    ??? Kidney disease Father    ??? Polycystic kidney disease Father    ??? No Known Problems Mother    ??? Prostate cancer Neg Hx    ??? GU problems Neg Hx        Code Status:  Full Code    Review of Systems    10 point ros were done and negative unless specified in the HPI       Objective:     Vitals:    12/10/18 1600   BP: 144/84   Pulse: 85   Resp: 18   Temp: 36.7 ??C (98.1 ??F)   SpO2: 99%      Gen: built for age , without no distress,   Eyes: no pallor no icterus   ENT: wearing mask   Neck: wearing mask CVS: s1 s2 heard, rhythm normal, no murmur   Resp: air entry b/l no creps   Abd: soft , tenderness left flank but mild   Ext: no edema, no muscle wasting   Cns:  Aaox3, no tremor,grossly intact   Skin:  No new rash, no lesions     Laboratory data, Imaging data, and MAR reviewed in EPIC      Time spent on counseling/coordination of care: 30 Minutes  Total time spent with patient: 15 Minutes

## 2018-12-12 LAB — CBC
HEMOGLOBIN: 9 g/dL — ABNORMAL LOW (ref 13.5–17.5)
MEAN CORPUSCULAR HEMOGLOBIN CONC: 29.7 g/dL — ABNORMAL LOW (ref 31.0–37.0)
MEAN CORPUSCULAR HEMOGLOBIN: 24.7 pg — ABNORMAL LOW (ref 26.0–34.0)
MEAN CORPUSCULAR VOLUME: 83.2 fL (ref 80.0–100.0)
MEAN PLATELET VOLUME: 10.7 fL — ABNORMAL HIGH (ref 7.0–10.0)
RED BLOOD CELL COUNT: 3.66 10*12/L — ABNORMAL LOW (ref 4.50–5.90)
RED CELL DISTRIBUTION WIDTH: 15.4 % — ABNORMAL HIGH (ref 12.0–15.0)
WBC ADJUSTED: 2.3 10*9/L — ABNORMAL LOW (ref 4.5–11.0)

## 2018-12-12 LAB — BASIC METABOLIC PANEL
ANION GAP: 7 mmol/L (ref 7–15)
ANION GAP: 9 mmol/L (ref 7–15)
BLOOD UREA NITROGEN: 18 mg/dL (ref 7–21)
BLOOD UREA NITROGEN: 17 mg/dL (ref 7–21)
BUN / CREAT RATIO: 11
CALCIUM: 9.3 mg/dL (ref 8.5–10.2)
CALCIUM: 9.1 mg/dL (ref 8.5–10.2)
CHLORIDE: 106 mmol/L (ref 98–107)
CHLORIDE: 106 mmol/L (ref 98–107)
CO2: 21 mmol/L — ABNORMAL LOW (ref 22.0–30.0)
CO2: 20 mmol/L — ABNORMAL LOW (ref 22.0–30.0)
CREATININE: 1.71 mg/dL — ABNORMAL HIGH (ref 0.70–1.30)
EGFR CKD-EPI AA MALE: 56 mL/min/{1.73_m2} — ABNORMAL LOW (ref >=60)
EGFR CKD-EPI NON-AA MALE: 48 mL/min/{1.73_m2} — ABNORMAL LOW (ref >=60)
EGFR CKD-EPI NON-AA MALE: 48 mL/min/{1.73_m2} — ABNORMAL LOW (ref >=60)
GLUCOSE RANDOM: 109 mg/dL (ref 70–179)
GLUCOSE RANDOM: 101 mg/dL (ref 70–179)
POTASSIUM: 5.2 mmol/L — ABNORMAL HIGH (ref 3.5–5.0)
POTASSIUM: 4.9 mmol/L (ref 3.5–5.0)
SODIUM: 134 mmol/L — ABNORMAL LOW (ref 135–145)
SODIUM: 135 mmol/L (ref 135–145)

## 2018-12-12 LAB — URINALYSIS
BACTERIA: NONE SEEN /HPF
BILIRUBIN UA: NEGATIVE
GLUCOSE UA: NEGATIVE
KETONES UA: NEGATIVE
NITRITE UA: NEGATIVE
PH UA: 6 (ref 5.0–9.0)
RBC UA: >182 /HPF — ABNORMAL HIGH (ref <=3)
SPECIFIC GRAVITY UA: 1.01 (ref 1.003–1.030)
SQUAMOUS EPITHELIAL: <1 /HPF (ref 0–5)
UROBILINOGEN UA: 0.2
WBC UA: 57 /HPF — ABNORMAL HIGH (ref <=2)

## 2018-12-12 LAB — CO2: Carbon dioxide:SCnc:Pt:Ser/Plas:Qn:: 20 — ABNORMAL LOW

## 2018-12-12 LAB — CHLORIDE: Chloride:SCnc:Pt:Ser/Plas:Qn:: 106

## 2018-12-12 LAB — MAGNESIUM: Magnesium:MCnc:Pt:Ser/Plas:Qn:: 1.8

## 2018-12-12 LAB — COVID-19 IGG: SARS coronavirus 2 Ab.IgG:PrThr:Pt:Ser/Plas:Ord:IA: NEGATIVE

## 2018-12-12 LAB — PLATELET COUNT: Platelets:NCnc:Pt:Bld:Qn:Automated count: 172

## 2018-12-12 LAB — TACROLIMUS, TROUGH: Lab: 6.8

## 2018-12-12 LAB — PHOSPHORUS: Phosphate:MCnc:Pt:Ser/Plas:Qn:: 3.3

## 2018-12-12 LAB — UROBILINOGEN UA: Lab: 0.2

## 2018-12-12 NOTE — Unmapped (Signed)
CM communicated via secure chat with MDW resident; noted that patient is likely to discharge tomorrow, awaiting final recommendations from ID and nephrology. No discharge needs identified at this time. CM remains available if needs arise.

## 2018-12-12 NOTE — Unmapped (Signed)
Internal Medicine (MDW) Progress Note    Subjective:   No acute events overnight.  --Afebrile  --Recieved IV iron  --f/u blood tacrolimus; per Renal Transplant  --cont. apreciate recs from Nephro Trans & ICID      Assessment & Plan:   Ethan West is a 43 y.o. 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    Principal Problem:    Dyspnea  Active Problems:    Kidney transplant status, living related donor    Autosomal dominant polycystic kidney disease    Chronic kidney disease (CKD), stage V (CMS-HCC)    Essential hypertension    Anemia in CKD (chronic kidney disease)    Ruptured cyst of kidney  Resolved Problems:    * No resolved hospital problems. *      Persistent dyspnea w/ now resolved fevers and diminished L flank pressure: Patient has been afebrile for the past three days with persistent dyspnea. Dyspnea could be 2/2 ongoing anemia since Tx surgery, iron stores have been low. Started IV iron, will CTM CBC. CMV, EBV VL, HSV, serum PCR negative (11/19) reducing likelihood of active infections. Patient is currently stable except for dyspnea, considering outpatient IV iron treatment if iron proves to be therapeutic. Appreciate renal transplant & ICID recommendations  --CTM   --Cont. IV iron per Nephro rec (they will set this up for outpatient)  --EPO pending iron panel and response  --f/u daily blood tacrolimus; per Renal Transplant  --Histo Urine Ag  --ceftriaxone 1g IV daily pending ICID change  --f/u BCx, Parvo, BK PCR  --cont. apreciate recs from Nephro Trans & ICID    Hematuria: Suspect 2/2 renal cyst ruptures as patient reports this happening 2-3 times per year. Ultrasound notable for perinephric hematoma as well. May be contributing to patient's progressive anemia as well.  - Trend Hb #ADPCKD s/p LDKT 06/2018 presenting with now resolved fevers and a dirty urinalysis. Initially concerning for infection in graft or elsewhere (see above); however, infectious work-up largely negative thus far. Baseline creatinine seems to be ~1.7. Currently 1.71(11/19)  -Continue current regimen of tac 2mg  BID per pharm  -cnt. Appreciate pharm, Nephro Trans recs    #HTN:  --Amlodipine 10 mg daily    Daily Checklist:  Diet: Regular Diet  DVT PPx: Lovenox 40mg  q24h  Electrolytes: No Repletion Needed  Code Status: Full Code  Dispo: Admit to Med W        Objective:   Temp:  [37.1 ??C (98.8 ??F)-37.2 ??C (99 ??F)] 37.2 ??C (99 ??F)  Heart Rate:  [79-83] 79  Resp:  [18] 18  BP: (133-149)/(80-97) 149/97  SpO2:  [99 %-100 %] 100 %    Gen: WDWN  in NAD, alert, oriented, answers questions appropriately  HEENT: atraumatic, sclera anicteric, MMM. OP w/o erythema or exudate   Heart: RRR, S1, S2, no M/R/G, no chest wall tenderness  Lungs: CTAB, no crackles or wheezes, no use of accessory muscles  Abdomen: Normoactive bowel sounds, soft, NTND, no rebound/guarding  Extremities: no clubbing, cyanosis, or edema  Psych: Appropriate mood and affect    Labs/Studies: Labs and Studies from the last 24hrs per EMR and Reviewed     Ethan West    I attest that I have reviewed the student note and that the components of the history of the present illness, the physical exam, and the assessment and plan documented were performed  by me or were performed in my presence by the student where I verified the documentation and performed (or re-performed) the exam and medical decision making.   Ethan West

## 2018-12-12 NOTE — Unmapped (Signed)
IMMUNOCOMPROMISED HOST INFECTIOUS DISEASE FOLLOW UP NOTE      Ethan West is being seen in consultation at the request of Ethan West, * for evaluation of fever in immunocompromised host.     Assessment/Recommendations:    Ethan West is a 43 y.o. male    ID Problem List:  APCKD s/p kidney transplant 06/25/18  - Surgical complications: none, stent removed 07/24/18  - Serologies: CMV D+/R+, EBV D+/R+, Toxo D?/R-  - Induction: Alemtuzumab  - Immunosuppression: see below  - Prophylaxis: Bactrim      Pertinent Exposure History  Donor Infections: none  New puppy- 56 week old beagle  [place]    Pertinent Co-morbidities  APCKD  HTN    Drug Intolerances  PCN- h/o rash without nausea/emesis    Infection History  #c/f UTI 07/01/18- Mixed GU flora  -treated by transplant with cefpodoxime     #fevers, chills, DOE, LUQ abdominal pain with UA and imaging c/f transplant pyelo vs. Ruptured cyst  - ddx: anemia/infection related to hematoma from native kidney vs. Transplant pyelo vs. Viral reactivation (CMV) vs new viral (Parvo/BK/Adeno) vs. Zoonotic/Tickborne (very unlikely) vs.  Fungal infection (histo possibly?)  - 12/09/18 CXR- no airspace disease; CT chest without contrast 12/10/18 with small GGO in RLL however no nodules  - 12/09/18 US Renal c/f mild hydronephrosis in allograft/pyelo? And native L kidney with large, unchanged hematoma  - 11/16 urine culture with mixed GU flora  - 11/16 BC x 1 NGTD    Recommendations:  - f/u CMV, EBV, HSV, Parvo, Adeno serum PCRs  - f/u urine BK PCR, histo Ag  - f/u 11/16 blood culture  - continue ceftriaxone at present  - at this point, we believe the patient's possible infection is under control and as such it is ok to give IV iron      The [facility] ID service will continue to follow.  Please page the ID Transplant/Liquid Oncology Fellow consult at [phone] with questions.  Patient discussed with Dr. Crista Curb, MD  12/11/2018 10:31 PM              Subjective Afebrile overnight, HDS. Cr improving.     Feels ok overall. No fevers, shaking chills, or persistent L flank pain, though does note some pressure there with large breath. No SOB, CP, abdominal pain, pain over transplanted kidney. Continues to have cola colored urine. No dysuria.       Allergies:  Allergies   Allergen Reactions   ??? Benazepril Rash   ??? Clarithromycin Rash   ??? Penicillins Rash     Tolerates cefepime and cefazolin.       Medications:   Current antibiotics:  Ceftriaxone    Previous antibiotics:  Vanc/cefepime    Current/Prior immunomodulators:  MMF/Tacro    Other medications reviewed.        Vital Signs last 24 hours:  Temp:  [37.1 ??C] 37.1 ??C  Heart Rate:  [80-83] 83  Resp:  [16-18] 18  BP: (133-147)/(80-95) 133/80  MAP (mmHg):  [110] 110  SpO2:  [99 %-100 %] 99 %    Physical Exam:  Patient Lines/Drains/Airways Status    Active Active Lines, Drains, & Airways     Name:   Placement date:   Placement time:   Site:   Days:    Peripheral IV 12/09/18 Right Antecubital   12/09/18    1715    Antecubital   2    Peripheral IV 12/09/18 Right Wrist  12/09/18    1720    Wrist   2    Arteriovenous Fistula - Vein Graft  Access 12/05/17 1516 Arteriovenous fistula Left Arm   12/05/17    1516    Arm   371              GEN:  looks well, no apparent distress  EYES: sclerae anicteric and non injected  ZOX:WRUEAVWUJ good and no thrush, leukoplakia or oral lesions  CV:no peripheral edema  PULM:normal work of breathing at rest  WJ:XBJY, NTND and no tenderness over renal allograft, mild left CVA tenderness, no flank bruising  NW:GNFAOZHY  RECTAL:deferred  SKIN:no petechiae, ecchymoses or obvious rashes on clothed exam  MSK:no swollen joints  NEURO:no tremor noted, facial expression symmetric and moves extremities equally  PSYCH:attentive, appropriate affect, good eye contact, fluent speech    Labs:    Lab Results   Component Value Date    WBC 2.9 (L) 12/11/2018    WBC 2.9 (L) 12/10/2018    WBC 7.2 01/08/2015 WBC 8.5 02/21/2013    HGB 8.9 (L) 12/11/2018    HGB 13.7 01/08/2015    HCT 29.1 (L) 12/11/2018    HCT 40.0 01/08/2015    Platelet 335 12/11/2018    Platelet 200 01/08/2015    Absolute Neutrophils 2.5 12/09/2018    Absolute Lymphocytes 0.3 (L) 12/09/2018    Absolute Eosinophils 0.1 12/09/2018    Sodium 138 12/11/2018    Sodium 142 01/08/2015    Potassium 5.0 12/11/2018    Potassium 4.1 01/08/2015    BUN 18 12/11/2018    BUN 27 (H) 01/08/2015    Creatinine 1.72 (H) 12/11/2018    Creatinine 1.90 (H) 12/10/2018    Creatinine 3.16 (H) 01/08/2015    Creatinine 2.64 (H) 08/06/2014    Glucose 111 12/11/2018    Magnesium 1.9 12/11/2018    Albumin 3.4 (L) 12/09/2018    Total Bilirubin 0.5 12/09/2018    AST 18 (L) 12/09/2018    ALT 9 12/09/2018    Alkaline Phosphatase 65 12/09/2018    INR 1.12 06/13/2018       Microbiology:  Past cultures were reviewed in Epic and CareEverywhere.    Imaging:  Independent visualization of images: I independently reviewed the image from (11/16-11/17) and I agree with the findings/interpretation.    Serologies:  Lab Results   Component Value Date    CMV IGG Positive (A) 06/13/2018    EBV VCA IgG Antibody Positive (A) 06/13/2018    Hep A IgG Nonreactive 05/07/2017    Hep B Surface Ag Nonreactive 06/13/2018    Hep B S Ab Nonreactive 06/13/2018    Hep B Surf Ab Quant <8.00 06/13/2018    Hepatitis C Ab Nonreactive 06/13/2018    RPR Nonreactive 06/13/2018    HSV 1 IgG Negative 06/13/2018    HSV 2 IgG Positive (A) 06/13/2018    Varicella IgG Positive 06/13/2018    Rubella IgG Scr Positive 05/07/2017    Toxoplasma Gondii IgG Negative 05/07/2017    Quantiferon TB Gold Plus Interpretation Negative 06/13/2018    Quantiferon Mitogen Minus Nil 10.00 06/13/2018    Quantiferon Antigen 1 minus Nil 0.00 06/13/2018       Immunizations:  Immunization History   Administered Date(s) Administered   ??? Influenza Vaccine Quad (IIV4 PF) 93mo+ injectable 10/04/2018   ??? TdaP 12/23/2009

## 2018-12-12 NOTE — Unmapped (Signed)
Pt is alert and oriented x 4. Remained free from falls and injuries during shift 7p-7a. Vitals stable. Med compliant. No complains of pain.  Denies needs or concerns at this time. VTE: lovenox. Plan of care reviewed. Call bell within reach. Instructed to call for assistance when needed.     Problem: Adult Inpatient Plan of Care  Goal: Plan of Care Review  Outcome: Progressing  Flowsheets (Taken 12/12/2018 0307)  Progress: improving  Plan of Care Reviewed With: patient  Goal: Patient-Specific Goal (Individualization)  Outcome: Progressing  Flowsheets (Taken 12/12/2018 0307)  Patient-Specific Goals (Include Timeframe): Pt will remain free from falls and injuries during shift 7p-7a  Individualized Care Needs: VS, pain control, antirejection meds, IV abx  Anxieties, Fears or Concerns: none voiced or expressed  Goal: Absence of Hospital-Acquired Illness or Injury  Outcome: Progressing  Intervention: Identify and Manage Fall Risk  Flowsheets (Taken 12/12/2018 0307)  Safety Interventions:   nonskid shoes/slippers when out of bed   lighting adjusted for tasks/safety   low bed   environmental modification  Intervention: Prevent Skin Injury  Flowsheets (Taken 12/12/2018 0307)  Pressure Reduction Techniques: frequent weight shift encouraged  Intervention: Prevent VTE (venous thromboembolism)  Flowsheets (Taken 12/12/2018 0307)  VTE Prevention/Management:   anticoagulation therapy   ambulation promoted  Intervention: Prevent Infection  Flowsheets (Taken 12/12/2018 0307)  Infection Prevention:   single patient room provided   rest/sleep promoted  Goal: Optimal Comfort and Wellbeing  Outcome: Progressing  Intervention: Monitor Pain and Promote Comfort  Flowsheets (Taken 12/12/2018 0307)  Pain Management Interventions:   pillow support provided   quiet environment facilitated  Intervention: Provide Person-Centered Care  Flowsheets (Taken 12/12/2018 0307)  Trust Relationship/Rapport:   care explained   empathic listening provided questions encouraged   thoughts/feelings acknowledged   questions answered  Goal: Readiness for Transition of Care  Outcome: Progressing  Goal: Rounds/Family Conference  Outcome: Progressing     Problem: Pain Acute  Goal: Optimal Pain Control  Outcome: Progressing  Intervention: Develop Pain Management Plan  Flowsheets (Taken 12/12/2018 0307)  Pain Management Interventions:   pillow support provided   quiet environment facilitated  Intervention: Prevent or Manage Pain  Flowsheets (Taken 12/12/2018 0307)  Sleep/Rest Enhancement:   awakenings minimized   relaxation techniques promoted  Intervention: Optimize Psychosocial Wellbeing  Flowsheets (Taken 12/12/2018 0307)  Supportive Measures: self-care encouraged     Problem: Infection  Goal: Infection Symptom Resolution  Outcome: Progressing  Intervention: Prevent or Manage Infection  Flowsheets (Taken 12/12/2018 0307)  Infection Management: aseptic technique maintained

## 2018-12-12 NOTE — Unmapped (Signed)
IMMUNOCOMPROMISED HOST INFECTIOUS DISEASE FOLLOW UP NOTE    Assessment/Recommendations:    Ethan West is a 43 y.o. 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 ruptured cyst in native kidney  - ddx: anemia/infection related to hematoma from native kidney vs. Transplant pyelo vs. Viral reactivation (CMV) vs new viral (Parvo/BK/Adeno) vs. Zoonotic/Tickborne (very unlikely) vs.  Fungal infection (histo possibly?)  - 12/09/18 CXR- no airspace disease; CT chest without contrast 12/10/18 with small GGO in RLL however no nodules  - 12/09/18 US Renal c/f mild hydronephrosis in allograft/pyelo? And native L kidney with large, unchanged hematoma  - 11/16 urine culture with mixed GU flora  - 11/16 BC x 1 NGTD  - 11/18 serum adeno and HSV1/2 negative    Recommendations:  - f/u CMV, EBV, Parvo serum PCRs  - f/u urine BK PCR, histo Ag  - please send a repeat urinalysis with culture prior to discharge  - continue ceftriaxone while inpatient, switch to cefdinir 300 mg BID to complete a 14 day course (ends 11/29)  - we will set up outpatient ID follow up in 2 weeks       Solid Organ Transplant Infectious Diseases Follow-up Instructions  - Appointment: Pending  - Location: [facility], [address] [place], [place]  - Labs: not needed  - Please fax labs to patient???s transplant coordinator: Ambrose Pancoast at [phone] (Kidney/Pancreas)  - Antibiotics:   (a) Cefdinir Antibiotic End Date: Date 11/29    The [facility] ID service will sign off.   Please page the ID Transplant/Liquid Oncology Fellow consult at [phone] with questions.  Patient discussed with Dr. Crista Curb, MD  Infectious Diseases Fellow  12/12/2018 10:30 AM        ______________________________________________________________________________________      Subjective    Afebrile overnight, HDS. Cr stable. WBC stable.     Feels well. No fevers, shaking chills, SOB, CP, dysuria. Continues to have cola colored urine.       Allergies:  Allergies   Allergen Reactions   ??? Benazepril Rash   ??? Clarithromycin Rash   ??? Penicillins Rash     Tolerates cefepime and cefazolin.       Medications:   Current antibiotics:  Ceftriaxone    Previous antibiotics:  Vanc/cefepime    Current/Prior immunomodulators:  MMF/Tacro    Other medications reviewed.        Vital Signs last 24 hours:  Temp:  [37.1 ??C-37.2 ??C] 37.2 ??C  Heart Rate:  [79-83] 79  Resp:  [18] 18  BP: (133-149)/(80-97) 149/97  SpO2:  [99 %-100 %] 100 %    Physical Exam:  Patient Lines/Drains/Airways Status    Active Active Lines, Drains, & Airways     Name:   Placement date:   Placement time:   Site:   Days:  Peripheral IV 12/09/18 Right Antecubital   12/09/18    1715    Antecubital   2    Peripheral IV 12/09/18 Right Wrist   12/09/18    1720    Wrist   2    Arteriovenous Fistula - Vein Graft  Access 12/05/17 1516 Arteriovenous fistula Left Arm   12/05/17    1516    Arm   371              GEN:  looks well, no apparent distress  EYES: sclerae anicteric and non injected  JWJ:XBJYNWGNF good and no thrush, leukoplakia or oral lesions  CV:no peripheral edema  PULM:normal work of breathing at rest  AO:ZHYQ, NTND and no tenderness over renal allograft, reports pressure with at left costovertebral angle with  mild left CVA tenderness, no R CVA tenderness, no flank bruising  MV:HQIONGEX  RECTAL:deferred SKIN:no petechiae, ecchymoses or obvious rashes on clothed exam  MSK:no swollen joints  NEURO:no tremor noted, facial expression symmetric and moves extremities equally  PSYCH:attentive, appropriate affect, good eye contact, fluent speech    Labs:    Lab Results   Component Value Date    WBC 2.3 (L) 12/12/2018    WBC 2.9 (L) 12/11/2018    WBC 7.2 01/08/2015    WBC 8.5 02/21/2013    HGB 9.0 (L) 12/12/2018    HGB 13.7 01/08/2015    HCT 30.5 (L) 12/12/2018    HCT 40.0 01/08/2015    Platelet 172 12/12/2018    Platelet 200 01/08/2015    Absolute Neutrophils 2.5 12/09/2018    Absolute Lymphocytes 0.3 (L) 12/09/2018    Absolute Eosinophils 0.1 12/09/2018    Sodium 134 (L) 12/12/2018    Sodium 142 01/08/2015    Potassium 5.2 (H) 12/12/2018    Potassium 4.1 01/08/2015    BUN 18 12/12/2018    BUN 27 (H) 01/08/2015    Creatinine 1.71 (H) 12/12/2018    Creatinine 1.72 (H) 12/11/2018    Creatinine 3.16 (H) 01/08/2015    Creatinine 2.64 (H) 08/06/2014    Glucose 109 12/12/2018    Magnesium 1.8 12/12/2018    Albumin 3.4 (L) 12/09/2018    Total Bilirubin 0.5 12/09/2018    AST 18 (L) 12/09/2018    ALT 9 12/09/2018    Alkaline Phosphatase 65 12/09/2018    INR 1.12 06/13/2018       Microbiology:  Past cultures were reviewed in Epic and CareEverywhere.    Imaging:  Independent visualization of images: I independently reviewed the image from (11/16-11/17) and I agree with the findings/interpretation.    Serologies:  Lab Results   Component Value Date    CMV IGG Positive (A) 06/13/2018    EBV VCA IgG Antibody Positive (A) 06/13/2018    Hep A IgG Nonreactive 05/07/2017    Hep B Surface Ag Nonreactive 06/13/2018    Hep B S Ab Nonreactive 06/13/2018    Hep B Surf Ab Quant <8.00 06/13/2018    Hepatitis C Ab Nonreactive 06/13/2018    RPR Nonreactive 06/13/2018    HSV 1 IgG Negative 06/13/2018    HSV 2 IgG Positive (A) 06/13/2018    Varicella IgG Positive 06/13/2018    Rubella IgG Scr Positive 05/07/2017 Toxoplasma Gondii IgG Negative 05/07/2017    Quantiferon TB Gold Plus Interpretation Negative 06/13/2018    Quantiferon Mitogen Minus Nil 10.00 06/13/2018    Quantiferon Antigen 1 minus Nil 0.00 06/13/2018       Immunizations:  Immunization History  Administered Date(s) Administered   ??? Influenza Vaccine Quad (IIV4 PF) 45mo+ injectable 10/04/2018   ??? TdaP 12/23/2009

## 2018-12-13 LAB — MAGNESIUM: Magnesium:MCnc:Pt:Ser/Plas:Qn:: 1.8

## 2018-12-13 LAB — BASIC METABOLIC PANEL
ANION GAP: 8 mmol/L (ref 7–15)
BLOOD UREA NITROGEN: 16 mg/dL (ref 7–21)
BUN / CREAT RATIO: 10
CALCIUM: 9.7 mg/dL (ref 8.5–10.2)
CO2: 22 mmol/L (ref 22.0–30.0)
CREATININE: 1.57 mg/dL — ABNORMAL HIGH (ref 0.70–1.30)
EGFR CKD-EPI AA MALE: 61 mL/min/{1.73_m2} (ref >=60)
EGFR CKD-EPI NON-AA MALE: 53 mL/min/{1.73_m2} — ABNORMAL LOW (ref >=60)
GLUCOSE RANDOM: 112 mg/dL (ref 70–179)
POTASSIUM: 5.4 mmol/L — ABNORMAL HIGH (ref 3.5–5.0)
SODIUM: 138 mmol/L (ref 135–145)

## 2018-12-13 LAB — CBC
HEMOGLOBIN: 9.1 g/dL — ABNORMAL LOW (ref 13.5–17.5)
MEAN CORPUSCULAR HEMOGLOBIN CONC: 30.2 g/dL — ABNORMAL LOW (ref 31.0–37.0)
MEAN CORPUSCULAR HEMOGLOBIN: 24.3 pg — ABNORMAL LOW (ref 26.0–34.0)
MEAN CORPUSCULAR VOLUME: 80.4 fL (ref 80.0–100.0)
MEAN PLATELET VOLUME: 7.2 fL (ref 7.0–10.0)
RED BLOOD CELL COUNT: 3.75 10*12/L — ABNORMAL LOW (ref 4.50–5.90)
RED CELL DISTRIBUTION WIDTH: 15.1 % — ABNORMAL HIGH (ref 12.0–15.0)
WBC ADJUSTED: 2.9 10*9/L — ABNORMAL LOW (ref 4.5–11.0)

## 2018-12-13 LAB — PHOSPHORUS
PHOSPHORUS: 3.4 mg/dL (ref 2.9–4.7)
Phosphate:MCnc:Pt:Ser/Plas:Qn:: 3.4

## 2018-12-13 LAB — CREATININE: Creatinine:MCnc:Pt:Ser/Plas:Qn:: 1.57 — ABNORMAL HIGH

## 2018-12-13 LAB — TACROLIMUS, TROUGH: Lab: 5.7

## 2018-12-13 LAB — HEMATOCRIT: Hematocrit:VFr:Pt:Bld:Qn:: 30.2 — ABNORMAL LOW

## 2018-12-13 LAB — PARVO PCR, BLD: Parvovirus B19 DNA:PrThr:Pt:Ser:Ord:Probe.amp.tar: NEGATIVE

## 2018-12-13 MED ORDER — ESCITALOPRAM OXALATE 10 MG PO TABS
10.00 | ORAL_TABLET | ORAL | Status: DC
Start: 2018-12-14 — End: 2018-12-13

## 2018-12-13 MED ORDER — GENERIC EXTERNAL MEDICATION
540.00 | Status: DC
Start: 2018-12-13 — End: 2018-12-13

## 2018-12-13 MED ORDER — TACROLIMUS 1 MG PO CAPS
2.00 | ORAL_CAPSULE | ORAL | Status: DC
Start: 2018-12-13 — End: 2018-12-13

## 2018-12-13 MED ORDER — CHOLECALCIFEROL 25 MCG (1000 UT) PO TABS
2000.00 | ORAL_TABLET | ORAL | Status: DC
Start: 2018-12-14 — End: 2018-12-13

## 2018-12-13 MED ORDER — GENERIC EXTERNAL MEDICATION
250.00 | Status: DC
Start: 2018-12-13 — End: 2018-12-13

## 2018-12-13 MED ORDER — GENERIC EXTERNAL MEDICATION
1.00 | Status: DC
Start: 2018-12-14 — End: 2018-12-13

## 2018-12-13 MED ORDER — AMLODIPINE BESYLATE 10 MG PO TABS
10.00 | ORAL_TABLET | ORAL | Status: DC
Start: 2018-12-14 — End: 2018-12-13

## 2018-12-13 MED ORDER — ACETAMINOPHEN 325 MG PO TABS
650.00 | ORAL_TABLET | ORAL | Status: DC
Start: ? — End: 2018-12-13

## 2018-12-13 MED ORDER — ENOXAPARIN SODIUM 40 MG/0.4ML ~~LOC~~ SOLN
40.00 | SUBCUTANEOUS | Status: DC
Start: 2018-12-13 — End: 2018-12-13

## 2018-12-13 MED ORDER — ESCITALOPRAM 10 MG TABLET
ORAL_TABLET | Freq: Every day | ORAL | 0 refills | 30.00000 days | Status: CP
Start: 2018-12-13 — End: 2019-01-12

## 2018-12-13 NOTE — Unmapped (Signed)
Pharmacist Discharge Note  Patient Name: Ethan West  Reason for admission: Fevers, Dyspnea, L Flank Pain  Reason for writing this note: new diagnosis with new medication    Highlighted medication changes with rationale (if applicable):  - Start cefdinir 300 mg BID to complete 14-day antibiotic course ending 12/22/18  Patient followed by ICID inpatient consult service during admission. Antibiotics started given concern for transplant pyelonephritis vs ruptured cyst in native kidney in setting of fevers, dyspnea, and L flank pain. Of note, patient's possible infection was noted to be under control and IV iron approved by inpatient ICID service prior to administration.       Medication access:  - No barriers identified    Outpatient follow-up:  [ ]  Iron repletion plan - patient received 750 mg (250 mg x3 doses) IV ferric gluconate (Ferrlecit) while inpatient; calculated iron deficit ~2000 mg on 12/11/18  [ ]  Follow-up repeat urine culture, pending on discharge     Rance Muir   Clinical Pharmacist    Future Appointments   Date Time Provider Department Center   01/10/2019  3:00 PM Leeroy Bock, MD [facility]

## 2018-12-13 NOTE — Unmapped (Signed)
This SW engaged in secure chat with Meyer Russel, MD who noted that patient will discharge this afternoon with no needs.

## 2018-12-13 NOTE — Unmapped (Signed)
Pt is alert and oriented x 4 and on room air.  Given IV abx and IV iron today.  Went over DC paperwork with patient and he voiced understanding.  Two PIVS removed per order.  Pt dressed and belongings packed.  Wife will pick up from lobby.  No questions or concerns.  Problem: Adult Inpatient Plan of Care  Goal: Plan of Care Review  Outcome: Discharged to Home  Goal: Patient-Specific Goal (Individualization)  Outcome: Discharged to Home  Goal: Absence of Hospital-Acquired Illness or Injury  Outcome: Discharged to Home  Goal: Optimal Comfort and Wellbeing  Outcome: Discharged to Home  Goal: Readiness for Transition of Care  Outcome: Discharged to Home  Goal: Rounds/Family Conference  Outcome: Discharged to Home     Problem: Pain Acute  Goal: Optimal Pain Control  Outcome: Discharged to Home     Problem: Infection  Goal: Infection Symptom Resolution  Outcome: Discharged to Home     Problem: Fall Injury Risk  Goal: Absence of Fall and Fall-Related Injury  Outcome: Discharged to Home

## 2018-12-13 NOTE — Unmapped (Signed)
Pt is alert and oriented x 4. VS'S on RA. Denied any pain. Tolerated his meds without complications. No concerns voiced. Lovenox for VTE. Safety/falls precautions maintained. Call bell and bedside table w/in reach. Will cont to monitor.     Problem: Adult Inpatient Plan of Care  Goal: Plan of Care Review  Flowsheets (Taken 12/13/2018 0058)  Plan of Care Reviewed With: patient  Goal: Patient-Specific Goal (Individualization)  Flowsheets (Taken 12/13/2018 0058)  Patient-Specific Goals (Include Timeframe): Pt will have normal iron lavels prior to discharge.  Individualized Care Needs: monitor labs/vs, falls and safety precautions, iron infusion  Anxieties, Fears or Concerns: none voiced this shift  Goal: Absence of Hospital-Acquired Illness or Injury  Intervention: Identify and Manage Fall Risk  Flowsheets (Taken 12/13/2018 0058)  Safety Interventions:  ??? environmental modification  ??? fall reduction program maintained  Intervention: Prevent Skin Injury  Flowsheets (Taken 12/13/2018 0058)  Pressure Reduction Techniques: frequent weight shift encouraged  Intervention: Prevent VTE (venous thromboembolism)  Flowsheets (Taken 12/13/2018 0058)  VTE Prevention/Management: anticoagulation therapy  Intervention: Prevent Infection  Flowsheets (Taken 12/13/2018 0058)  Infection Prevention: handwashing promoted  Goal: Optimal Comfort and Wellbeing  Intervention: Monitor Pain and Promote Comfort  Flowsheets (Taken 12/13/2018 0058)  Pain Management Interventions: care clustered  Intervention: Provide Person-Centered Care  Flowsheets (Taken 12/13/2018 0058)  Trust Relationship/Rapport:  ??? care explained  ??? choices provided  ??? questions encouraged  ??? questions answered

## 2018-12-13 NOTE — Unmapped (Signed)
Pt is A&Ox4. Denies pain. Pt has received IV abx and IV iron infusion without problem. Pt performs ADLs independently. The plan of care has been explained to the pt and pt verbalized to understand. Pt remains free of fall or injury. VSS. Cont to monitor.  Problem: Adult Inpatient Plan of Care  Goal: Plan of Care Review  Outcome: Progressing  Flowsheets (Taken 12/12/2018 1800)  Progress: improving  Plan of Care Reviewed With: patient  Goal: Patient-Specific Goal (Individualization)  Outcome: Progressing  Flowsheets (Taken 12/12/2018 1800)  Patient-Specific Goals (Include Timeframe): pt will tolerated IV iron infusion without problem this shift.  Individualized Care Needs: IV abx. IV iron. monitor VS and Labs  Anxieties, Fears or Concerns: pt concerns about discharge plan  Goal: Absence of Hospital-Acquired Illness or Injury  Outcome: Progressing  Intervention: Identify and Manage Fall Risk  Flowsheets (Taken 12/12/2018 1800)  Safety Interventions:   family at bedside   nonskid shoes/slippers when out of bed   infection management   environmental modification  Intervention: Prevent Skin Injury  Flowsheets (Taken 12/12/2018 1800)  Pressure Reduction Techniques:   frequent weight shift encouraged   heels elevated off bed  Intervention: Prevent Infection  Flowsheets (Taken 12/12/2018 1800)  Infection Prevention:   environmental surveillance performed   equipment surfaces disinfected   handwashing promoted   rest/sleep promoted  Goal: Optimal Comfort and Wellbeing  Outcome: Progressing  Intervention: Monitor Pain and Promote Comfort  Flowsheets (Taken 12/12/2018 1800)  Pain Management Interventions:   position adjusted   pillow support provided  Intervention: Provide Person-Centered Care  Flowsheets (Taken 12/12/2018 1800)  Trust Relationship/Rapport:   emotional support provided   empathic listening provided   questions answered   questions encouraged   care explained  Goal: Readiness for Transition of Care Outcome: Progressing  Intervention: Mutually Develop Transition Plan  Flowsheets (Taken 12/12/2018 1800)  Concerns to be Addressed:   coping/stress   discharge planning  Goal: Rounds/Family Conference  Outcome: Progressing     Problem: Pain Acute  Goal: Optimal Pain Control  Outcome: Progressing     Problem: Infection  Goal: Infection Symptom Resolution  Outcome: Progressing

## 2018-12-13 NOTE — Unmapped (Signed)
Physician Discharge Summary [facility]  [address] [facility]  [address]  [place] [address]  Dept: [phone]  Loc: [phone]     Identifying Information:   Ethan West  04-24-1975  [id number]    Primary Care Physician: Steele Sizer, MD   Code Status: Full Code    Admit Date: 12/09/2018    Discharge Date: 12/13/2018     Discharge To: Home    Discharge Service: [facility] - GEN Med W Teaching (MDW)     Discharge Attending Physician: [name], MD    Discharge Diagnoses:  Principal Problem:    Dyspnea POA: Unknown  Active Problems:    Kidney transplant status, living related donor POA: Not Applicable    Autosomal dominant polycystic kidney disease POA: Not Applicable    Chronic kidney disease (CKD), stage V (CMS-HCC) POA: Yes    Essential hypertension POA: Yes    Anemia in CKD (chronic kidney disease) POA: Yes    Ruptured cyst of kidney POA: Not Applicable  Resolved Problems:    * No resolved hospital problems. *      Outpatient Provider Follow Up Issues:   - follow-up pending infectious work-up (see below)    Hospital Course:   Ethan West is a 43 y.o. 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 Hospital course listed below in problem-based format. Fevers, dyspnea and L flank pressure in setting of ADPCKD s/p LDKT: Patient had been afebrile for the past two days with persistent dyspnea. Left flank pain is now reported a pressure and patient continues to report coke colored urine. CT chest / VQ scan was negative, and recent stress test echo recently which was normal with acceptable ejection fraction. CT Chest without contrast showed clear lungs w/o airspace disease or pleural effusion(11/17). Transplant nephrology, surgery, and infectious disease was consulted. Thought of dyspnea with not clear other source is likely due to ongoing anemia. Transplant nephrology recommended EPO, and iron infusions. He was started on IV iron infusion, with hopes to continue outpatient. Regarding his fevers, had not fevered inpatient. He was started on cefepime on admission, and transitioned to ceftriaxone 11/17, per ICID recs. Urine w/micro regarding mixed GU flora and consider MDR screen. All other infectious workup has been??negative. He will be discharged with cefdinir 300 mg BID to complete 14-day course of abx.    Hematuria due to renal cyst rupture: This has been happening 2-3 times per year. Ultrasound notable for perinephric hematoma as well. May be contributing to patient's progressive anemia as well.    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 Continued myfortic and prograf, based off levels.  ??  HTN: home amlodipine continued      Touchbase with Outpatient Provider:  Warm Handoff: Completed on 12/13/18 by [name]  (Intern) via [facility] Message    Procedures:  None  No admission procedures for hospital encounter.  ______________________________________________________________________  Discharge Medications:     Your Medication List      START taking these medications    cefdinir 300 MG capsule  Commonly known as: OMNICEF  Take 1 capsule (300 mg total) by mouth Two (2) times a day for 9 days.  Start taking on: December 14, 2018        CHANGE how you take these medications albuterol 90 mcg/actuation inhaler  Commonly known as: PROVENTIL HFA;VENTOLIN HFA  2 puffs every 4-6  hours with spacer  What changed:   ?? how much to take  ?? how to take this  ?? when to take this  ?? reasons to take this  ?? additional instructions        CONTINUE taking these medications    AEROCHAMBER MV Spcr  Generic drug: inhalational spacing device  1 each by Miscellaneous route 6XD PRN.     amLODIPine 10 MG tablet  Commonly known as: NORVASC  Take 1 tablet (10 mg total) by mouth daily.     aspirin 81 MG tablet  Commonly known as: ECOTRIN  Take 1 tablet (81 mg total) by mouth daily.     carvediloL 6.25 MG tablet  Commonly known as: COREG  Take 1 tablet (6.25 mg total) by mouth Two (2) times a day.     cholecalciferol (vitamin D3) 50 mcg (2,000 unit) tablet  Take 1 tablet (2,000 Units total) by mouth daily.     escitalopram oxalate 10 MG tablet  Commonly known as: LEXAPRO  Take 1 tablet (10 mg total) by mouth daily.     mycophenolate 180 MG EC tablet  Commonly known as: MYFORTIC  Take 3 tablets (540 mg total) by mouth Two (2) times a day.     sulfamethoxazole-trimethoprim 400-80 mg per tablet  Commonly known as: BACTRIM  Take 1 tablet (80 mg of trimethoprim total) by mouth Every Monday, Wednesday, and Friday.     tacrolimus 1 MG capsule  Commonly known as: PROGRAF  Take 2 capsules in the morning and take 2 capsules at night            Allergies:  Benazepril, Clarithromycin, and Penicillins  ______________________________________________________________________  Pending Test Results (if blank, then none):  Pending Labs     Order Current Status    Urine Culture Collected (12/12/18 1628)    BK Virus, DNA, Quantitative, Blood In process    CMV DNA, quantitative, PCR In process    EBV QUANTITATIVE PCR, BLOOD In process    Histoplasma Antigen, Urine In process    Parvovirus PCR, Blood In process    Tacrolimus Level, Trough In process    Urine Culture In process    Blood Culture Preliminary result Blood Culture Preliminary result          Most Recent Labs:  All lab results last 24 hours -   Recent Results (from the past 24 hour(s))   Basic Metabolic Panel    Collection Time: 12/12/18  2:31 PM   Result Value Ref Range    Sodium 135 135 - 145 mmol/L    Potassium 4.9 3.5 - 5.0 mmol/L    Chloride 106 98 - 107 mmol/L    CO2 20.0 (L) 22.0 - 30.0 mmol/L    Anion Gap 9 7 - 15 mmol/L    BUN 17 7 - 21 mg/dL    Creatinine 1.61 (H) 0.70 - 1.30 mg/dL    BUN/Creatinine Ratio 10     EGFR CKD-EPI Non-African American, Male 48 (L) >=60 mL/min/1.74m2    EGFR CKD-EPI African American, Male 39 (L) >=60 mL/min/1.43m2    Glucose 101 70 - 179 mg/dL    Calcium 9.1 8.5 - 09.6 mg/dL   Urinalysis    Collection Time: 12/12/18  4:28 PM   Result Value Ref Range    Color, UA Red     Clarity, UA Cloudy     Specific Gravity, UA 1.010 1.003 - 1.030    pH, UA 6.0 5.0 - 9.0  Leukocyte Esterase, UA Trace (A) Negative    Nitrite, UA Negative Negative    Protein, UA 100 mg/dL (A) Negative    Glucose, UA Negative Negative    Ketones, UA Negative Negative    Urobilinogen, UA 0.2 mg/dL 0.2 mg/dL, 1.0 mg/dL    Bilirubin, UA Negative Negative    Blood, UA Moderate (A) Negative    RBC, UA >182 (H) <=3 /HPF    WBC, UA 57 (H) <=2 /HPF    Squam Epithel, UA <1 0 - 5 /HPF    Bacteria, UA None Seen None Seen /HPF   Magnesium Level    Collection Time: 12/13/18  6:30 AM   Result Value Ref Range    Magnesium 1.8 1.6 - 2.2 mg/dL   Phosphorus Level    Collection Time: 12/13/18  6:30 AM   Result Value Ref Range    Phosphorus 3.4 2.9 - 4.7 mg/dL   Basic metabolic panel    Collection Time: 12/13/18  6:30 AM   Result Value Ref Range    Sodium 138 135 - 145 mmol/L    Potassium 5.4 (H) 3.5 - 5.0 mmol/L    Chloride 108 (H) 98 - 107 mmol/L    CO2 22.0 22.0 - 30.0 mmol/L    Anion Gap 8 7 - 15 mmol/L    BUN 16 7 - 21 mg/dL    Creatinine 4.54 (H) 0.70 - 1.30 mg/dL    BUN/Creatinine Ratio 10     EGFR CKD-EPI Non-African American, Male 53 (L) >=60 mL/min/1.40m2 EGFR CKD-EPI African American, Male 44 >=60 mL/min/1.66m2    Glucose 112 70 - 179 mg/dL    Calcium 9.7 8.5 - 09.8 mg/dL   CBC    Collection Time: 12/13/18  6:30 AM   Result Value Ref Range    WBC 2.9 (L) 4.5 - 11.0 10*9/L    RBC 3.75 (L) 4.50 - 5.90 10*12/L    HGB 9.1 (L) 13.5 - 17.5 g/dL    HCT 11.9 (L) 14.7 - 53.0 %    MCV 80.4 80.0 - 100.0 fL    MCH 24.3 (L) 26.0 - 34.0 pg    MCHC 30.2 (L) 31.0 - 37.0 g/dL    RDW 82.9 (H) 56.2 - 15.0 %    MPV 7.2 7.0 - 10.0 fL    Platelet 322 150 - 440 10*9/L       Relevant Studies/Radiology (if blank, then none):  Xr Chest Portable    Result Date: 12/09/2018  EXAM: XR CHEST PORTABLE DATE: ACCESSION: [id number] UN DICTATED: 12/09/2018 5:53 PM INTERPRETATION LOCATION: [facility] CLINICAL INDICATION: 43 years old Male with FEVER  COMPARISON: Abdominal radiograph 11/27/2018 TECHNIQUE: Portable Chest Radiograph. FINDINGS: Mildly decreased lung volumes. No focal consolidation. No pleural effusion or pneumothorax Stable cardiomediastinal silhouette.     Mildly decreased lung volumes without acute airspace disease.    Ct Chest Wo Contrast    Result Date: 12/10/2018 EXAM: CT CHEST WO CONTRAST DATE: 12/10/2018 8:14 AM ACCESSION: [id number] UN DICTATED: 12/10/2018 9:31 AM INTERPRETATION LOCATION: [facility] CLINICAL INDICATION: 42 years old Male with Dyspnea  COMPARISON: Correlation with CT abdomen and pelvis dated 11/27/2018. TECHNIQUE: A helical CT scan was obtained without IV contrast from the thoracic inlet through the hemidiaphragms. Images were reconstructed in the axial plane.  Coronal and sagittal reformatted images of the chest were also provided for further evaluation of the lung parenchyma. FINDINGS: AIRWAYS, LUNGS, PLEURA: Clear central tracheobronchial tree.  Dependent subpleural nodularity in the posterior lower lobes, likely subsegmental atelectasis.  No pleural effusion. MEDIASTINUM: Normal heart size. No pericardial effusion. Normal caliber thoracic aorta.  No mediastinal lymphadenopathy. IMAGED ABDOMEN: Stable. SOFT TISSUES: Unremarkable. BONES: Unremarkable.     Clear lungs. No airspace disease or pleural effusion.    Nm Lung Scan Perfusion Imaging Only    Result Date: 12/10/2018  EXAM: NM LUNG SCAN PERFUSION IMAGING ONLY DATE: 12/10/2018 ACCESSION: [id number] UN DICTATED: 12/10/2018 1:51 PM INTERPRETATION LOCATION: [facility] CLINICAL INDICATION: 43 years old Male: eval for e/o PE  RADIOPHARMACEUTICAL: Tc-61m MAA, 4.0 mCi, IV Lung ventilation scan was not performed due to droplet precautions.  TECHNIQUE: Anterior/Posterior, Oblique and Lateral views of the chest were obtained following the intravenous injection Tc-60m MAA. COMPARISON: Same-day chest CT, Chest x-ray dated 12/09/2018  FINDINGS: Perfusion: No wedge-shaped segmental or subsegmental perfusion defects.      No wedge-shaped perfusion defects to suggest presence of pulmonary embolism.    US Renal Transplant W Doppler    Result Date: 12/09/2018 EXAM: US RENAL TRANSPLANT W DOPPLER DATE: 12/09/2018 8:22 PM ACCESSION: [id number] UN DICTATED: 12/09/2018 8:24 PM INTERPRETATION LOCATION: [facility] CLINICAL INDICATION: 43 years old Male with abd pain, hematuria COMPARISON: 11/27/18 TECHNIQUE:  Ultrasound views of the renal transplant were obtained using gray scale and color and spectral Doppler imaging. FINDINGS: TRANSPLANTED KIDNEY: The renal transplant was located in the left lower quadrant. Normal size and echogenicity.  No solid masses or calculi. Small subcentimeter cystic lesion. No perinephric collections identified. Mild hydronephrosis with the renal pelvis measuring 4 mm. Urothelial thickening is seen. VESSELS: - Perfusion: Using power Doppler, patchy perfusion was seen throughout the renal parenchyma. - Resistive indices in the renal transplant are stable to slightly increased compared with prior examination. - Main renal artery/iliac artery: Patent - Main renal vein/iliac vein: Patent BLADDER: Unremarkable. 1. Patchy perfusion throughout the renal transplant. 2. Stable to slightly increased resistive indices with elevation of the RI at the main renal artery anastomosis, new from prior. 3. Mild hydronephrosis with urothelial thickening. Correlation with urinalysis is recommended to exclude superimposed infection. Please see below for data measurements: Renal Transplant: length 11.22 cm; width 4.66 cm; height 5.93 cm Arcuate artery superior resistive index: 0.68 Arcuate artery mid resistive index: 0.65 Arcuate artery inferior resistive index: 0.70 Previous resistive indices range of arcuate arteries: .63-.71 Segmental artery superior resistive index: 0.77 Segmental artery mid resistive index: 0.75 Segmental artery inferior resistive index: 0.80 Previous resistive indices range of segmental arteries: .66-.71 Main renal artery hilum resistive index: 0.74 Main renal artery mid resistive index: 0.78 Main renal artery anastomosis resistive index: 0.85 Previous resistive indices range of main renal artery: .68-.75 Main renal vein: patent Iliac artery: Patent Iliac vein: Patent Bladder volume prevoid: 46.3  mL     US Renal Complete    Result Date: 12/10/2018 EXAM: US RENAL COMPLETE DATE: 12/09/2018 11:10 PM ACCESSION: [id number] UN DICTATED: 12/09/2018 11:11 PM INTERPRETATION LOCATION: [facility] CLINICAL INDICATION: 43 years old Male with hematuria  COMPARISON: CT abdomen pelvis from 11/27/2018. Ultrasound renal 05/07/17 TECHNIQUE: Static and cine images of the kidneys and bladder were performed. FINDINGS: KIDNEYS: The kidneys are diffusely enlarged and replaced with numerous cystic lesions. Accurate measurement of the size is limited. Smaller subcentimeter lesions are too small to characterize. No definite solid renal masses identified, however, cannot be excluded. No evidence of hydronephrosis or calculi are identified. There is at least one cyst which is minimally complex with a single avascular septation which measures 3.6 x 3.5 x 4.2 cm and is located in the interpolar right kidney,  unchanged. There is an ill-defined heterogeneous area in the upper pole of the left kidney measuring approximately 8.5 x 6 x 4.8 cm, similar to prior CT. No active bleeding identified. BLADDER: Unremarkable. Distended. No significant post void residual. Unremarkable.     -Bilateral large polycystic kidneys. Smaller subcentimeter lesions are too small to characterize. No definite solid renal masses identified, however, cannot be excluded. -Heterogeneous area in the upper pole of the left kidney, likely represent previously seen hematoma/perinephric hematoma, similar to that seen on CT abdomen pelvis from 11/27/2018. Recommend follow-up to resolution. Please see below for data measurements: Right kidney: 20.5 cm Left kidney:  21.3 cm Bladder volume prevoid: 500.24  mL Bladder volume postvoid: 0  mL ++++++++++++++++++++ The findings of this study were discussed via telephone with DR. Williams Che by Dr. Vista Mink  on 12/10/2018 12:40 AM.      ______________________________________________________________________  Discharge Instructions:   Activity Instructions     Activity as tolerated Other Instructions     Call MD for:  difficulty breathing, headache or visual disturbances      Call MD for:  persistent nausea or vomiting      Call MD for:  severe uncontrolled pain      Call MD for:  temperature >38.5 Celsius      Discharge instructions      You were admitted to [facility] with shortness of breath, left flank pain and blood in your urine. We think your left flank pain and blood in the urine are due to rupture of one of your renal cysts. We consulted with our infectious disease and renal transplant doctors while you were in the hospital. You underwent an extensive infectious disease work-up which has been negative. It's possible your symptoms of shortness of breath are due to anemia due to decreased iron stores (iron helps with the creation of red blood cells). You received IV iron in the hospital and transplant nephrology will help coordinate this outpatient.      Please take your medications as prescribed. Medication changes are listed below and a full list of medications is in this discharge packet. Please keep your follow-up appointments after the hospital for ongoing care. It has been a pleasure taking care of you, we wish you the best.     MEDICATION CHANGES:  -- START taking cefdinir 300 mg twice a day for 9 days (start this on 11/21)    FOLLOW-UP:  -- Transplant nephrology will contact you to set up IV iron for you outpatient.  -- Infectious Disease team will contact you to set up a follow-up appointment in 2 weeks.                    Follow Up instructions and Outpatient Referrals     Call MD for:  difficulty breathing, headache or visual disturbances      Call MD for:  persistent nausea or vomiting      Call MD for:  severe uncontrolled pain      Call MD for:  temperature >38.5 Celsius      Discharge instructions            Appointments which have been scheduled for you    Jan 10, 2019  3:00 PM  (Arrive by 2:45 PM)  VIDEO VISIT- OTHER with Leeroy Bock, MD [facility] KIDNEY TRANSPLANT [facility] [address] [facility] REG[facility]) [address]  [place] [address]  [phone]  ______________________________________________________________________  Discharge Day Services:  BP 112/82  - Pulse 77  - Temp 37 ??C (Oral)  - Resp 17  - Ht 182 cm (5' 11.65)  - Wt (!) 105.2 kg (231 lb 14.8 oz)  - SpO2 97%  - BMI 31.76 kg/m??      Pt seen on the day of discharge and determined appropriate for discharge.    Condition at Discharge: good    Length of Discharge: I spent less than 30 mins in the discharge of this patient.

## 2018-12-14 LAB — HISTOPLASMA AG, URINE VALUE

## 2018-12-14 LAB — HISTOPLASMA ANTIGEN, URINE

## 2018-12-14 MED ORDER — CEFDINIR 300 MG CAPSULE
ORAL_CAPSULE | Freq: Two times a day (BID) | ORAL | 0 refills | 9.00000 days | Status: CP
Start: 2018-12-14 — End: 2018-12-23

## 2018-12-17 LAB — CMV DNA, QUANTITATIVE, PCR: CMV VIRAL LD: NOT DETECTED

## 2018-12-17 LAB — CMV QUANT LOG10: Lab: 0

## 2018-12-18 LAB — BK BLOOD COMMENT: Lab: 0

## 2018-12-18 LAB — BK VIRUS QUANTITATIVE PCR, BLOOD: BK BLOOD RESULT: NOT DETECTED

## 2018-12-20 LAB — EBV VIRAL LOAD RESULT: Lab: NOT DETECTED

## 2018-12-24 ENCOUNTER — Encounter: Admit: 2018-12-24 | Discharge: 2018-12-25 | Payer: BLUE CROSS/BLUE SHIELD

## 2018-12-24 DIAGNOSIS — Z79899 Other long term (current) drug therapy: Principal | ICD-10-CM

## 2018-12-24 DIAGNOSIS — N189 Chronic kidney disease, unspecified: Principal | ICD-10-CM

## 2018-12-24 DIAGNOSIS — D631 Anemia in chronic kidney disease: Principal | ICD-10-CM

## 2018-12-24 DIAGNOSIS — Z940 Kidney transplant status: Principal | ICD-10-CM

## 2018-12-24 LAB — CBC W/ AUTO DIFF
BASOPHILS ABSOLUTE COUNT: 0 10*9/L (ref 0.0–0.1)
BASOPHILS RELATIVE PERCENT: 0.2 %
EOSINOPHILS ABSOLUTE COUNT: 0.1 10*9/L (ref 0.0–0.7)
EOSINOPHILS RELATIVE PERCENT: 2.1 %
HEMATOCRIT: 32.3 % — ABNORMAL LOW (ref 38.0–50.0)
HEMOGLOBIN: 10.5 g/dL — ABNORMAL LOW (ref 13.5–17.5)
LYMPHOCYTES ABSOLUTE COUNT: 0.2 10*9/L — ABNORMAL LOW (ref 0.7–4.0)
LYMPHOCYTES RELATIVE PERCENT: 7.4 %
MEAN CORPUSCULAR HEMOGLOBIN CONC: 32.7 g/dL (ref 30.0–36.0)
MEAN CORPUSCULAR HEMOGLOBIN: 25.1 pg — ABNORMAL LOW (ref 26.0–34.0)
MEAN PLATELET VOLUME: 7.4 fL (ref 7.0–10.0)
MONOCYTES ABSOLUTE COUNT: 0.3 10*9/L (ref 0.1–1.0)
MONOCYTES RELATIVE PERCENT: 12.4 %
NEUTROPHILS ABSOLUTE COUNT: 2.2 10*9/L (ref 1.7–7.7)
NEUTROPHILS RELATIVE PERCENT: 77.9 %
RED CELL DISTRIBUTION WIDTH: 16.9 % — ABNORMAL HIGH (ref 12.0–15.0)
WBC ADJUSTED: 2.8 10*9/L — ABNORMAL LOW (ref 3.5–10.5)

## 2018-12-24 LAB — URINALYSIS
GLUCOSE UA: 100 — AB
KETONES UA: 15 — AB
NITRITE UA: POSITIVE — AB
PH UA: 6.5 (ref 5.0–9.0)
SPECIFIC GRAVITY UA: 1.02 (ref 1.005–1.040)
SQUAMOUS EPITHELIAL: 0 /HPF (ref 0–5)
UROBILINOGEN UA: 2
WBC UA: 15 /HPF — ABNORMAL HIGH (ref <2)

## 2018-12-24 LAB — MEAN CORPUSCULAR HEMOGLOBIN: Erythrocyte mean corpuscular hemoglobin:EntMass:Pt:RBC:Qn:Automated count: 25.1 — ABNORMAL LOW

## 2018-12-24 LAB — CO2: Carbon dioxide:SCnc:Pt:Ser/Plas:Qn:: 22

## 2018-12-24 LAB — TRANSFERRIN: Transferrin:MCnc:Pt:Ser/Plas:Qn:: 187.1 — ABNORMAL LOW

## 2018-12-24 LAB — RENAL FUNCTION PANEL
ALBUMIN: 4.1 g/dL (ref 3.5–5.0)
ANION GAP: 9 mmol/L (ref 7–15)
BLOOD UREA NITROGEN: 16 mg/dL (ref 7–21)
BUN / CREAT RATIO: 9
CO2: 22 mmol/L (ref 22.0–30.0)
CREATININE: 1.85 mg/dL — ABNORMAL HIGH (ref 0.70–1.30)
EGFR CKD-EPI NON-AA MALE: 44 mL/min/{1.73_m2} — ABNORMAL LOW (ref >=60)
GLUCOSE RANDOM: 109 mg/dL (ref 70–179)
PHOSPHORUS: 2.5 mg/dL — ABNORMAL LOW (ref 2.9–4.7)
POTASSIUM: 4.9 mmol/L (ref 3.5–5.0)
SODIUM: 139 mmol/L (ref 135–145)

## 2018-12-24 LAB — FERRITIN: Ferritin:MCnc:Pt:Ser/Plas:Qn:: 624 — ABNORMAL HIGH

## 2018-12-24 LAB — IRON & TIBC
IRON: 32 ug/dL — ABNORMAL LOW (ref 35–165)
TOTAL IRON BINDING CAPACITY (CALC): 235.7 mg/dL — ABNORMAL LOW (ref 252.0–479.0)
TRANSFERRIN: 187.1 mg/dL — ABNORMAL LOW (ref 200.0–380.0)

## 2018-12-24 LAB — MAGNESIUM: Magnesium:MCnc:Pt:Ser/Plas:Qn:: 1.7

## 2018-12-24 LAB — BACTERIA

## 2018-12-25 ENCOUNTER — Encounter: Admit: 2018-12-25 | Discharge: 2018-12-26 | Payer: BLUE CROSS/BLUE SHIELD

## 2018-12-25 DIAGNOSIS — N189 Chronic kidney disease, unspecified: Principal | ICD-10-CM

## 2018-12-25 DIAGNOSIS — D631 Anemia in chronic kidney disease: Secondary | ICD-10-CM

## 2018-12-25 DIAGNOSIS — N185 Chronic kidney disease, stage 5: Principal | ICD-10-CM

## 2018-12-25 LAB — HEPATIC FUNCTION PANEL
ALKALINE PHOSPHATASE: 87 U/L (ref 38–126)
ALT (SGPT): 12 U/L (ref <50)
AST (SGOT): 21 U/L (ref 19–55)
BILIRUBIN DIRECT: 0.3 mg/dL (ref 0.00–0.40)
PROTEIN TOTAL: 6.8 g/dL (ref 6.5–8.3)

## 2018-12-25 LAB — TACROLIMUS BLOOD: Lab: 6.8

## 2018-12-25 LAB — BILIRUBIN TOTAL: Bilirubin:MCnc:Pt:Ser/Plas:Qn:: 0.7

## 2018-12-25 MED ADMIN — acetaminophen (TYLENOL) tablet 650 mg: 650 mg | ORAL | @ 15:00:00 | Stop: 2018-12-25

## 2018-12-25 MED ADMIN — sodium chloride (NS) 0.9 % infusion: 20 mL/h | INTRAVENOUS | @ 16:00:00 | Stop: 2018-12-25

## 2018-12-25 MED ADMIN — ferumoxytoL (FERAHEME) injection 510 mg: 510 mg | INTRAVENOUS | @ 16:00:00 | Stop: 2018-12-25

## 2018-12-25 NOTE — Unmapped (Signed)
Pt here for infusion of Feraheme. Pt denies any previous adverse reactions to medication. Reviewed with pt s/sx of reactions to report and instructed to seek medical treatment if they present.   1019- Premedication given: Tylenol 650mg . Benadryl 25mg  not given due to pt driving.  1057- PIV inserted to right hand using 24G jelco angiocath x 6 attempts. Pt gave verbal consent for attempts 5 and 6, as well as 1-4. Blood return noted and flushed with NS.    1058- NS infused at 50 ml/hr via pump.  1100- Feraheme infusion started at 100 ml/hr, increased by 100 ml/hr after 60 seconds until at rate of 559ml/hr as per order. Pt resting. Call bell placed.   1120- Feraheme completed. NS infusing at 50 ml/hr. IV site is free of redness and swelling. Pt to remain for 30 minutes post infusion for observation. Pt has no c/o of discomfort when asked.   1150- Observation complete. NS stopped and IV d/c-ed. IV site is free of redness and swelling. S/sx of adverse reactions reviewed again and pt instructed to seek medical treatment if they arise. Pt left clinic in stable condition.

## 2018-12-25 NOTE — Unmapped (Deleted)
1019  1057  1100

## 2018-12-27 LAB — CMV DNA, QUANTITATIVE, PCR: CMV QUANT: 1183 [IU]/mL — ABNORMAL HIGH (ref <0)

## 2018-12-27 LAB — CMV QUANT: Lab: 1183 — ABNORMAL HIGH

## 2018-12-30 ENCOUNTER — Encounter
Admit: 2018-12-30 | Discharge: 2018-12-31 | Payer: BLUE CROSS/BLUE SHIELD | Attending: Critical Care Medicine | Primary: Critical Care Medicine

## 2018-12-30 DIAGNOSIS — Z79899 Other long term (current) drug therapy: Principal | ICD-10-CM

## 2018-12-30 DIAGNOSIS — Z940 Kidney transplant status: Principal | ICD-10-CM

## 2018-12-30 MED ORDER — VALGANCICLOVIR 450 MG TABLET
ORAL_TABLET | Freq: Every day | ORAL | 2 refills | 30.00000 days | Status: CP
Start: 2018-12-30 — End: 2019-03-30
  Filled 2018-12-30: qty 60, 30d supply, fill #0

## 2018-12-30 MED FILL — ASPIRIN 81 MG TABLET,DELAYED RELEASE: ORAL | 30 days supply | Qty: 30 | Fill #5

## 2018-12-30 MED FILL — VALGANCICLOVIR 450 MG TABLET: 30 days supply | Qty: 60 | Fill #0 | Status: AC

## 2018-12-30 MED FILL — ASPIRIN 81 MG TABLET,DELAYED RELEASE: 30 days supply | Qty: 30 | Fill #5 | Status: AC

## 2018-12-30 NOTE — Unmapped (Signed)
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   If Yes: Student is symptomatic: N/A    Student is a [employer] employee/volunteer: N/A      Patient is experiencing symptoms severe enough that you would like to speak with a provider: No (Symptomatic)      Patient is calling to schedule a COVID test: Yes -- Asked additional question below before attempting to schedule  If Yes: Patient has tested positive in the last 90 days for COVID-19: No -- Attempted to schedule      Advised If you think you might have COVID-19 or might have been exposed to COVID-19, you should practice physical distancing and other measures to reduce disease spread. We do recommend that you and everyone in your house stay at home as much as possible.  Keep six (6) feet of distance between you and other people. Wash your hands frequently and avoid touching your face.  The CDC now recommends wearing cloth face coverings or masks in public settings where other physical distancing measures are difficult to maintain, such as grocery stores and pharmacies. Call your PCP or the COVID Help Line [phone]) from 7a-7p with new or worsening symptoms.      The call was resolved in the following manner: COVID testing scheduled

## 2018-12-30 NOTE — Unmapped (Signed)
[facility] Clinical Assessment & Refill Coordination Note    Ethan West, DOB: October 14, 1975  Phone: [phone] (home)     All above HIPAA information was verified with patient.     Was a Nurse, learning disability used for this call? No    Specialty Medication(s):   Transplant: valgancyclovir 450mg      Current Outpatient Medications   Medication Sig Dispense Refill   ??? albuterol HFA 90 mcg/actuation inhaler 2 puffs every 4-6 hours with spacer (Patient taking differently: Inhale 2 puffs every four (4) hours as needed for wheezing or shortness of breath. with spacer) 1 Inhaler 11   ??? amLODIPine (NORVASC) 10 MG tablet Take 1 tablet (10 mg total) by mouth daily. 30 tablet 11   ??? aspirin (ECOTRIN) 81 MG tablet Take 1 tablet (81 mg total) by mouth daily. 30 tablet 11   ??? carvediloL (COREG) 6.25 MG tablet Take 1 tablet (6.25 mg total) by mouth Two (2) times a day. 180 tablet 3   ??? cholecalciferol, vitamin D3, 50 mcg (2,000 unit) tablet Take 1 tablet (2,000 Units total) by mouth daily.     ??? escitalopram oxalate (LEXAPRO) 10 MG tablet Take 1 tablet (10 mg total) by mouth daily. 30 tablet 0   ??? inhalational spacing device (AEROCHAMBER MV) Spcr 1 each by Miscellaneous route 6XD PRN. 1 each 1   ??? mycophenolate (MYFORTIC) 180 MG EC tablet Take 3 tablets (540 mg total) by mouth Two (2) times a day. 540 tablet 3   ??? tacrolimus (PROGRAF) 1 MG capsule Take 2 capsules in the morning and take 2 capsules at night 450 capsule 3   ??? valGANciclovir (VALCYTE) 450 mg tablet Take 2 tablets (900 mg total) by mouth daily. 60 tablet 2     Current Facility-Administered Medications   Medication Dose Route Frequency Provider Last Rate Last Dose   ??? ferumoxytoL (FERAHEME) injection 510 mg  510 mg Intravenous Once Leeroy Bock, MD            Changes to medications: [name] reports no changes at this time.    Allergies   Allergen Reactions   ??? Benazepril Rash   ??? Clarithromycin Rash   ??? Penicillins Rash Tolerates cefepime and cefazolin.       Changes to allergies: No    SPECIALTY MEDICATION ADHERENCE     Valganciclovir 450 mg: 0 days of medicine on hand       Medication Adherence    Patient reported X missed doses in the last month: 0  Specialty Medication: Valganciclovir 450mg   Patient is on additional specialty medications: No          Specialty medication(s) dose(s) confirmed: Regimen is correct and unchanged.     Are there any concerns with adherence? No    Adherence counseling provided? Not needed    CLINICAL MANAGEMENT AND INTERVENTION      Clinical Benefit Assessment:    Do you feel the medicine is effective or helping your condition? Yes    Clinical Benefit counseling provided? Not needed    Adverse Effects Assessment:    Are you experiencing any side effects? No    Are you experiencing difficulty administering your medicine? No    Quality of Life Assessment:    How many days over the past month did your kidney transplant  keep you from your normal activities? For example, brushing your teeth or getting up in the morning. 0    Have you discussed this with your provider?  Not needed    Therapy Appropriateness:    Is therapy appropriate? Yes, therapy is appropriate and should be continued    DISEASE/MEDICATION-SPECIFIC INFORMATION      N/A    PATIENT SPECIFIC NEEDS     ? Does the patient have any physical, cognitive, or cultural barriers? No    ? Is the patient high risk? Yes, patient is taking a REMS drug. Medication is dispensed in compliance with REMS program.     ? Does the patient require a Care Management Plan? No     ? Does the patient require physician intervention or other additional services (i.e. nutrition, smoking cessation, social work)? No      SHIPPING     Specialty Medication(s) to be Shipped:   Transplant: valgancyclovir 450mg     Other medication(s) to be shipped: ASA     Changes to insurance: No    Delivery Scheduled: Yes, Expected medication delivery date: 12/31/2018. Medication will be delivered via UPS to the confirmed prescription address in [facility].    The patient will receive a drug information handout for each medication shipped and additional FDA Medication Guides as required.  Verified that patient has previously received a Conservation officer, historic buildings.    All of the patient's questions and concerns have been addressed.    Tera Helper   [facility] Specialty Pharmacist

## 2018-12-30 NOTE — Unmapped (Signed)
I talked with the patient.  He is still feeling fatigue, short of breath, low grade fever off and on.  His CMV level came back at 1183.  I talked with Dr. Nestor Lewandowsky.  He would like pt to have a COVID done and once negative be seen on Thursday with him and Dr. Norma Fredrickson.  We ordered valcyte in the mean time as his CMV was positive from last week.  He will get labs on Thursday as well.

## 2018-12-30 NOTE — Unmapped (Signed)
[facility] Specialty Medication Onboarding    Specialty Medication: VALGANCYCLOVIR 450MG  TABLETS  Prior Authorization: Not Required   Financial Assistance: No - copay  <$25  Final Copay/Day Supply: $0 / 30 DAYS    Insurance Restrictions: None     Notes to Pharmacist: DOSE CHANGE    The triage team has completed the benefits investigation and has determined that the patient is able to fill this medication at [facility]. Please contact the patient to complete the onboarding or follow up with the prescribing physician as needed.

## 2018-12-31 NOTE — Unmapped (Signed)
Assessment     [name] is a 43 y.o. male presenting to [facility] for COVID testing.     Problem List Items Addressed This Visit     None      Visit Diagnoses     Contact with or exposure to viral disease    -  Primary    Relevant Orders    COVID-19 PCR          Plan     If no testing performed, pt counseled on routine care for respiratory illness.  If testing performed, COVID sent.  Patient directed to Home given findings during today's visit.    Subjective     [name] is a 43 y.o. male who [facility] with complaints of the following:    Exposure History: In the last 21 days?     Have you traveled outside of [place]? No               Have you been in close contact with someone confirmed by a test to have COVID? (Close contact is within 6 feet for at least 10 minutes) No       Have you worked in a health care facility? No     Lived or worked facility like a nursing home, group home, or assisted living?    No         Are you scheduled to have surgery or a procedure in the next 3 days? No               Are you scheduled to receive cancer chemotherapy within the next 7 days?    No     Have you ever been tested before for COVID-19 with a swab of your nose? Yes: When: 06/24/18, 10/27/18, 11/20/18, Where: [place], [employer]   Are you a healthcare worker being tested so to return to work No         Right now,  do you have any of the following that developed over the past 7 days (as stated by patient on intake form):    Subjective fever (felt feverish) No   Chills (especially repeated shaking chills) No   Severe fatigue (felt very tired) Yes, how many days? 30   Muscle aches No   Runny nose No   Sore throat No   Loss of taste or smell No   Cough (new onset or worsening of chronic cough) No   Shortness of breath Yes, how many days? 30   Nausea or vomiting No   Headache No   Abdominal Pain No Diarrhea (3 or more loose stools in last 24 hours) No     History/Medical Conditions (as stated by patient on intake form):    Do you have any of the following:   Asthma or emphysema or COPD No   Cystic Fibrosis No   Diabetes No   High Blood Pressure  No   Cardiovascular Disease No   Chronic Kidney Disease Yes   Chronic Liver Disease No   Chronic blood disorder like Sickle Cell Disease  No   Weak immune system due to disease or medication No   Neurologic condition that limits movement  No   Developmental delay - Moderate to Severe  No   Recent (within past 2 weeks) or current Pregnancy No   Morbid Obesity (>100 pounds over ideal weight) No   Current Smoker No   Former Smoker Yes  Objective     Given above, testing performed: Yes    Testing Performed:  Test Specimen Type Sent to   COVID-19  [facility]       Scribe's Attestation: Onis Markoff Marrie Tiasha Helvie, AGNP obtained and performed the history, physical exam and medical decision making elements that were entered into the chart.  Signed by Edison Simon serving as Scribe, on 12/31/2018 9:09 AM  The documentation recorded by the scribe accurately reflects the service I personally performed and the decisions made by me. Arthi Mcdonald M. Amen Dargis, AGNP  January 01, 2019 7:54 AM

## 2019-01-01 ENCOUNTER — Telehealth
Admit: 2019-01-01 | Discharge: 2019-01-02 | Payer: BLUE CROSS/BLUE SHIELD | Attending: Geriatric Medicine | Primary: Geriatric Medicine

## 2019-01-01 NOTE — Unmapped (Deleted)
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  - Post-op course: uncomplicated  - Other PMH: HTN  - Serology: CMV D+/R+  - DSAs: not checked post-transplant  - Biopsies/rejections: ntd  Since last visit (10/22 w/ [facility], 7/15 w/ CPP):  - Hospital admission 11/16-11/20 for ongoing dyspnea, L flank pain, fevers, hematuria   - VQ scan neg, Echo normal, CT chest w/ clear lungs   - Suspected dyspnea 2/2 ongoing anemia, started on IV iron infusion   - No fevers while inpt, urine w/ no growth   - Started on cefepime and switched to CTX per ICID, plan to d/c on cefdinir x14d   - Renal US w/ perinephric hematoma, may be contributing to anemia  - IV Feraheme infusion on 12/2  - Pt message on 12/6 and 12/7 still fatigued, SOB, low grad fever off/on  - COVID test 12/7 negative  IS:  - Induction: [place]  - Tac 3 mg BID  - Tac goal: 7-9 per last [facility] note  - Last tac level: 6.8 (12/1) - no dose changes  - Myfortic 540 mg BID  Infxn:  - CMV (mod risk): Valcyte 900 mg daily (continue minimum 14d and until CMV <200)   - Valcyte stopped after 3 months per protocol, then restarted for pos CMV level   - CMV level on 12/1: 1183 (log 3.07)  - PCP: Bactrim SS MWF (end date 12/25/18 - 6 months) - should be off?  - Fungal: nystatin completed  CV ppx:  - ASA? yes  - Statin? No, not indicated  - Last lipid panel (01/26/16): TC 148, TG 114, HDL 42, LDL 83  BP:  - Meds: amlodipine 10 mg/d, carvedilol 6.25 BID  BG:  - Last A1c: 5.0% (06/13/18)  Anemia:  - Last iron panel (12/1): iron 32, Tsat 14%, ferritin 624  - H/h (12/1): 10.5/32.3  - IV Feraheme  Lytes:  - Mg level: 1.7 (12/1)  - Supplementation: none  BM:  - No meds  Pain:  - No meds  Bone health:  - Vit D level (07/03/18): 24.0  - DEXA: none  Women's/men's health:  Other:  - Lexapro 10 mg/d  - Albuterol HFA PRN  - Calculated CrCl (adjusted BW 87.3 kg) = 63.6 ml/min Questions for today:  - Confirm tac goal (7-9 vs 6-8)? In setting of new CMV infection...  - Received Valcyte? Inc dose to 900 BID?? Watch WBC/ANC...  - Stop Bactrim now that >6 months post-txp?  - Finished course of cefdinir? S/sx of UTI/pyelo or systemic infxn?  - BP control? Room to inc carvedilol if needed...  - Repeat vit D level      Medication changes today:   1. None    Education/Adherence tools provided today:  1.provided updated medication list  2. provided additional education on immunosuppression and transplant related medications including reviewing indications of medications, dosing and side effects    Follow up items:  1. goal of understanding indications and dosing of immunosuppression medications    Next visit with pharmacy in 1-3 months  ____________________________________________________________________    Ethan West is a 43 y.o. male s/p living kidney transplant on 06/25/2018 (Kidney) 2/2 polycystic kidney disease.     Other PMH significant for HTN    Seen by pharmacy today for: medication management and pill box fill and adherence education; last seen by  pharmacy 5 months ago     Since last visit, patient was admitted with dyspnea and left flank pain from 12/09/18 to 12/13/18. Work-up for dyspnea unremarkable so thought to be 2/2 ongoing anemia and initiated on IV Feraheme. Urine culture with no growth, but given reported fevers, flank pain, and hematuria, ICID recommended 14 day course of antibiotics (cefepime --> ceftriaxone --> PO cefdinir), which patient has since completed.    CC:  Patient has no complaints today ***    There were no vitals filed for this visit.    Allergies   Allergen Reactions   ??? Benazepril Rash   ??? Clarithromycin Rash   ??? Penicillins Rash     Tolerates cefepime and cefazolin.       All medications reviewed and updated.     Medication list includes revisions made during today???s encounter    Outpatient Encounter Medications as of 01/02/2019 Medication Sig Dispense Refill   ??? albuterol HFA 90 mcg/actuation inhaler 2 puffs every 4-6 hours with spacer (Patient taking differently: Inhale 2 puffs every four (4) hours as needed for wheezing or shortness of breath. with spacer) 1 Inhaler 11   ??? amLODIPine (NORVASC) 10 MG tablet Take 1 tablet (10 mg total) by mouth daily. 30 tablet 11   ??? aspirin (ECOTRIN) 81 MG tablet Take 1 tablet (81 mg total) by mouth daily. 30 tablet 11   ??? carvediloL (COREG) 6.25 MG tablet Take 1 tablet (6.25 mg total) by mouth Two (2) times a day. 180 tablet 3   ??? [EXPIRED] cefdinir (OMNICEF) 300 MG capsule Take 1 capsule (300 mg total) by mouth Two (2) times a day for 9 days. 18 capsule 0   ??? cholecalciferol, vitamin D3, 50 mcg (2,000 unit) tablet Take 1 tablet (2,000 Units total) by mouth daily.     ??? escitalopram oxalate (LEXAPRO) 10 MG tablet Take 1 tablet (10 mg total) by mouth daily. 30 tablet 0   ??? inhalational spacing device (AEROCHAMBER MV) Spcr 1 each by Miscellaneous route 6XD PRN. 1 each 1   ??? mycophenolate (MYFORTIC) 180 MG EC tablet Take 3 tablets (540 mg total) by mouth Two (2) times a day. 540 tablet 3   ??? [EXPIRED] sulfamethoxazole-trimethoprim (BACTRIM) 400-80 mg per tablet Take 1 tablet (80 mg of trimethoprim total) by mouth Every Monday, Wednesday, and Friday. 12 tablet 5   ??? tacrolimus (PROGRAF) 1 MG capsule Take 2 capsules in the morning and take 2 capsules at night 450 capsule 3   ??? valGANciclovir (VALCYTE) 450 mg tablet Take 2 tablets (900 mg total) by mouth daily. 60 tablet 2     Facility-Administered Encounter Medications as of 01/02/2019   Medication Dose Route Frequency Provider Last Rate Last Dose   ??? ferumoxytoL (FERAHEME) injection 510 mg  510 mg Intravenous Once Leeroy Bock, MD           Immunosuppression:    Induction agent : alemtuzumab    Current immunosuppression:  ?? tacrolimus 3 mg PO bid   ?? tac goal: 7-9 per Dr. Nestor Lewandowsky   ?? myfortic540  mg PO bid   ?? steroid free Patient complains of minor tremor ***    IMMUNOSUPPRESSION DRUG LEVELS:  Lab Results   Component Value Date    Tacrolimus, Trough 5.7 12/13/2018    Tacrolimus, Trough 6.8 12/12/2018    Tacrolimus, Trough 9.3 12/11/2018    Tacrolimus, Timed 6.8 12/24/2018     No results found for: CYCLO  No results found for: EVEROLIMUS  No results found for: SIROLIMUS    Prograf level is accurate 12 hour trough    Graft function: stable  DSA: ntd (not yet checked post-transplant)  Biopsies to date: ntd  WBC/ANC:  wnl ***    Plan: Will maintain current immunosuppression as dose was just changed last night. Continue to monitor.    OI Prophylaxis:   CMV Status: D+/ R+, moderate risk . CMV prophylaxis: valganciclovir 900 mg daily   ?? Completed 3 months of prophylaxis per protocol in 09/2018  ?? Restarted Valcyte on *** for detectable CMV  ?? Last CMV level: 1183 (12/24/18)  Estimated Creatinine Clearance: 64.2 mL/min (A) (based on SCr of 1.85 mg/dL (H)).  Lab Results   Component Value Date    CMV Quant 1,183 (H) 12/24/2018     PCP Prophylaxis: bactrim SS 1 tab MWF x 6 months (end date 12/25/2018)  Thrush: completed in hospital    Assessment: Patient is tolerating infectious prophylaxis well    Plan: Continue per protocol. Continue to monitor.    CV Prophylaxis:  Aspirin: yes, asa 81 mg   The 10-year ASCVD risk score Denman George DC Jr., et al., 2013) is: 2%  Statin therapy: Not indicated; currently not on statin    Plan: not indicated. Continue to monitor     BP: Goal < 140/90. Clinic vitals reported above  Home BP ranges: 130s/80s ***  Current meds: amlodipine 10 mg daily, carvedilol 6.25 mg BID    Plan:  Continue to monitor    Anemia of CKD:  H/H:   Lab Results   Component Value Date    HGB 10.5 (L) 12/24/2018     Lab Results   Component Value Date    HCT 32.3 (L) 12/24/2018     Iron panel:  Lab Results   Component Value Date    IRON 32 (L) 12/24/2018    TIBC 235.7 (L) 12/24/2018    FERRITIN 624.0 (H) 12/24/2018     Lab Results Component Value Date    Iron Saturation (%) 14 (L) 12/24/2018       Prior ESA use: none post-transplant  IV iron use: IV Feraheme 510 mg most recently on 12/25/18    Plan: stable. Continue to monitor. ***     DM: Goal A1c < 7  Lab Results   Component Value Date    A1C 5.0 06/13/2018     History of Dm? No  Diet: did not address  Exercise: did not address  Fluid intake: 6-8 bottles of water daily    Plan:  Continue to monitor    Electrolytes: wnl  Current meds: none    Plan: Continue to monitor.    GI/BM: pt reports no diarrhea  Current meds: Colace PRN (not using), Miralax PRN (not using)    Plan: Continue to monitor    Pain: pt reports moderate incisional site pain  Current meds: APAP 500 mg BID, tramadol PRN (using HS PRN)    Plan:  Continue to monitor    Bone health:   Vitamin D Level: 24.0 on 07/03/18. Goal > 30.   Last DEXA results:  none available  Current meds: Vitamin D3 2,000 units     Plan: Vitamin D level  out of goal,continue supplementation.  Continue to monitor.     Women's/Men's Health:  Ethan West is a 43 y.o. male. Patient reports no men's/women's health issues    Plan: Continue to monitor    Anxiety/depression  Current meds: escitalopram 10 mg daily  Plan: continue to monitor    Adherence:  Patient has poor understanding of medications; was able to independently identify names/doses of immunosuppressants but not OI meds  Patient  does not fill their own pill box on a regular basis at home.  Wife has been managing.  Patient brought medication card:yes  Pill box:was correct    Plan: Encouraged patient to bring filled pill box to next visit; provided extensive adherence counseling/intervention    Spent approximately 30 minutes on educating this patient and greater than 50% was spent in direct face to face counseling regarding post transplant medication education. Questions and concerns were address to patient's satisfaction. Patient was reviewed with Dr. Nestor Lewandowsky who was agreement with the stated plan.    During this visit, the following was completed:   BG log data assessment  BP log data assessment  Labs ordered and evaluated  complex treatment plan >1 DS   Patient education was completed for 11-24 minutes     All questions/concerns were addressed to the patient's satisfaction.  __________________________________________  Patient was reviewed with *** who was in agreement with the stated plan.      Damita Dunnings, PharmD, CPP, [facility]

## 2019-01-02 ENCOUNTER — Encounter: Admit: 2019-01-02 | Discharge: 2019-01-02 | Payer: BLUE CROSS/BLUE SHIELD

## 2019-01-02 ENCOUNTER — Encounter: Admit: 2019-01-02 | Discharge: 2019-01-02 | Payer: BLUE CROSS/BLUE SHIELD | Attending: Surgery | Primary: Surgery

## 2019-01-02 DIAGNOSIS — Z79899 Other long term (current) drug therapy: Principal | ICD-10-CM

## 2019-01-02 DIAGNOSIS — Z940 Kidney transplant status: Principal | ICD-10-CM

## 2019-01-02 LAB — URINALYSIS
BILIRUBIN UA: NEGATIVE
GLUCOSE UA: NEGATIVE
KETONES UA: NEGATIVE
LEUKOCYTE ESTERASE UA: NEGATIVE
NITRITE UA: NEGATIVE
PH UA: 6 (ref 5.0–9.0)
PROTEIN UA: 100 — AB
RBC UA: >182 /HPF — ABNORMAL HIGH (ref <=3)
UROBILINOGEN UA: 0.2
WBC UA: 42 /HPF — ABNORMAL HIGH (ref <=2)

## 2019-01-02 LAB — CBC W/ AUTO DIFF
BASOPHILS RELATIVE PERCENT: 0.3 %
EOSINOPHILS ABSOLUTE COUNT: 0.1 10*9/L (ref 0.0–0.4)
EOSINOPHILS RELATIVE PERCENT: 2.5 %
HEMATOCRIT: 38 % — ABNORMAL LOW (ref 41.0–53.0)
HEMOGLOBIN: 11.4 g/dL — ABNORMAL LOW (ref 13.5–17.5)
LARGE UNSTAINED CELLS: 3 % (ref 0–4)
LYMPHOCYTES ABSOLUTE COUNT: 0.5 10*9/L — ABNORMAL LOW (ref 1.5–5.0)
LYMPHOCYTES RELATIVE PERCENT: 18.6 %
MEAN CORPUSCULAR HEMOGLOBIN CONC: 30 g/dL — ABNORMAL LOW (ref 31.0–37.0)
MEAN CORPUSCULAR VOLUME: 82.6 fL (ref 80.0–100.0)
MEAN PLATELET VOLUME: 9 fL (ref 7.0–10.0)
MONOCYTES ABSOLUTE COUNT: 0.2 10*9/L (ref 0.2–0.8)
MONOCYTES RELATIVE PERCENT: 7.7 %
NEUTROPHILS ABSOLUTE COUNT: 1.8 10*9/L — ABNORMAL LOW (ref 2.0–7.5)
NEUTROPHILS RELATIVE PERCENT: 68 %
PLATELET COUNT: 201 10*9/L (ref 150–440)
RED BLOOD CELL COUNT: 4.6 10*12/L (ref 4.50–5.90)
RED CELL DISTRIBUTION WIDTH: 18.4 % — ABNORMAL HIGH (ref 12.0–15.0)
WBC ADJUSTED: 2.7 10*9/L — ABNORMAL LOW (ref 4.5–11.0)

## 2019-01-02 LAB — CREATININE, URINE: Lab: 114

## 2019-01-02 LAB — COMPREHENSIVE METABOLIC PANEL
ALBUMIN: 4.2 g/dL (ref 3.5–5.0)
ALKALINE PHOSPHATASE: 80 U/L (ref 38–126)
ALT (SGPT): 20 U/L (ref <50)
AST (SGOT): 29 U/L (ref 19–55)
BILIRUBIN TOTAL: 0.7 mg/dL (ref 0.0–1.2)
BLOOD UREA NITROGEN: 16 mg/dL (ref 7–21)
BUN / CREAT RATIO: 10
CALCIUM: 9.6 mg/dL (ref 8.5–10.2)
CHLORIDE: 104 mmol/L (ref 98–107)
CO2: 20 mmol/L — ABNORMAL LOW (ref 22.0–30.0)
CREATININE: 1.66 mg/dL — ABNORMAL HIGH (ref 0.70–1.30)
EGFR CKD-EPI AA MALE: 57 mL/min/{1.73_m2} — ABNORMAL LOW (ref >=60)
EGFR CKD-EPI NON-AA MALE: 50 mL/min/{1.73_m2} — ABNORMAL LOW (ref >=60)
GLUCOSE RANDOM: 91 mg/dL (ref 70–99)
POTASSIUM: 5 mmol/L (ref 3.5–5.0)
SODIUM: 137 mmol/L (ref 135–145)

## 2019-01-02 LAB — LIPASE: Triacylglycerol lipase:CCnc:Pt:Ser/Plas:Qn:: 300 — ABNORMAL HIGH

## 2019-01-02 LAB — CO2: Carbon dioxide:SCnc:Pt:Ser/Plas:Qn:: 20 — ABNORMAL LOW

## 2019-01-02 LAB — PROTEIN / CREATININE RATIO, URINE
CREATININE, URINE: 114 mg/dL
PROTEIN URINE: 307 mg/dL

## 2019-01-02 LAB — PHOSPHORUS
PHOSPHORUS: 2.7 mg/dL — ABNORMAL LOW (ref 2.9–4.7)
Phosphate:MCnc:Pt:Ser/Plas:Qn:: 2.7 — ABNORMAL LOW

## 2019-01-02 LAB — BILIRUBIN UA: Bilirubin:PrThr:Pt:Urine:Ord:Test strip: NEGATIVE

## 2019-01-02 LAB — MAGNESIUM: Magnesium:MCnc:Pt:Ser/Plas:Qn:: 1.6

## 2019-01-02 LAB — EOSINOPHILS ABSOLUTE COUNT: Eosinophils:NCnc:Pt:Bld:Qn:Automated count: 0.1

## 2019-01-02 LAB — BILIRUBIN DIRECT: Bilirubin.glucuronidated+Bilirubin.albumin bound:MCnc:Pt:Ser/Plas:Qn:: 0.2

## 2019-01-02 LAB — TACROLIMUS, TROUGH: Lab: 8.6

## 2019-01-02 LAB — SMEAR REVIEW

## 2019-01-02 LAB — AMYLASE: Amylase:CCnc:Pt:Ser/Plas:Qn:: 103

## 2019-01-02 NOTE — Unmapped (Signed)
I spent 15 minutes with the patient at his clinic visit today.  I reviewed and updated his medications.  Reviewed with the pharmacist, will increase valcyte to 900mg  bid x 2 weeks then every day.  He is getting all of his medications ok.  The last few days he has had less hematuria but then today it is back.  We will stop the aspirin.  His BP has been good 110-120/70-80's.  He agrees that he needs to have his kidneys removed and would like to schedule it for the 1st of the year.  He will do a bottle of magnesium citrate and clear liquid diet the night before.  I will in touch after next week after Dr. Norma Fredrickson knows his schedule.

## 2019-01-02 NOTE — Unmapped (Signed)
[facility] Follow-Up Note    ASSESSMENT:   Ethan West is a 43 y.o. year old male     ID PROBLEM LIST  APCKD s/p kidney transplant 06/25/18  - Surgical complications: none, stent removed 07/24/18  - Serologies: CMV D+/R+, EBV D+/R+, Toxo D?/R-  - Induction: Alemtuzumab  - Immunosuppression: see below  - Prophylaxis: Bactrim  ??  Pertinent Exposure History  Donor Infections: none  New puppy- 55 week old beagle  [place]  ??  Pertinent Co-morbidities  APCKD  HTN  ??  Drug Intolerances  PCN- h/o rash without nausea/emesis  ??  Infection History  #c/f UTI 07/01/18- Mixed GU flora  - treated by transplant with cefpodoxime   ??  #fevers, LUQ abdominal pain UA and imaging c/f transplant pyelo vs. ruptured cyst in native kidney  - ddx: anemia/infection related to hematoma from native kidney vs. Transplant pyelo vs. Viral reactivation (CMV) vs new viral (Parvo/BK/Adeno) vs. Zoonotic/Tickborne (very unlikely) vs.  Fungal infection (histo possibly?)  - 12/09/18 CXR- no airspace disease; CT chest without contrast 12/10/18 with small GGO in RLL however no nodules  - 12/09/18 US Renal c/f mild hydronephrosis in allograft/pyelo? 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 vs other etiology)  -CMV VL >1,000 on 12/1, previously undetected  - valgan 450 mg PO BID since 12/7, repeat CMV VL pending    Recommendations:  --Continue valganciclovir, recommend treatment dosing of --900 mg PO BID (or equivalent adjusted for renal function) Repeat CMV VL 1 week after starting valgan and then continue weekly  --Duration of valgan will be 2 weeks and need for longer duration to be determined based on CMV VLs, symptoms, and other ongoing work-up for low-grade fevers and hematuria (the latter due to hemorrhagic cyst most likely)  --Repeat UA with culture at 12/9 renal transplant clinic appointm if febrile and/or any other UTI-related symptoms  --If febrile in clinic, recommend blood cultures x 2 percutaneous   --RTC in 3 weeks (If FUO persists consider serum histo Ag, urine histo Ag cannot be performed on urine sample with hematuria)    Kathrynn Humble, MD  Immunocompromised Host ID  [id number]    I spent 15 minutes on the real-time audio and video with the patient. I spent an additional 10 minutes on pre- and post-visit activities.     The patient was physically located in [place] or a state in which I am permitted to provide care. The patient and/or parent/guardian understood that s/he may incur co-pays and cost sharing, and agreed to the telemedicine visit. The visit was reasonable and appropriate under the circumstances given the patient's presentation at the time.    The patient and/or parent/guardian has been advised of the potential risks and limitations of this mode of treatment (including, but not limited to, the absence of in-person examination) and has agreed to be treated using telemedicine. The patient's/patient's family's questions regarding telemedicine have been answered.     If the visit was completed  in an ambulatory setting, the patient and/or parent/guardian has also been advised to contact their provider???s office for worsening conditions, and seek emergency medical treatment and/or call 911 if the patient deems either necessary.        Subjective:   Interval History: Still has low-grade fevers after hospital discharge (99-100 at Tm) intermittently.No chills or rigors.  Continues to have some abdominal discomfort as well.  Overall, fevers and abdominal symptoms less severe than during time of admission. Stopped cefdinir as scheduled. No rash or other symptoms related to antibiotic reported.     CMV VL returned > 1,000 on 12/1 and started valgan on 12/7. Also continues to have hematuria and anemia requiring IV iron supplementation (on 12/2).    Other ID w/u from hospitalization reviewed and negative. Urine histo Ag was not performed due to presence of RBCs.      Medications  Current Outpatient Medications   Medication Sig Dispense Refill   ??? albuterol HFA 90 mcg/actuation inhaler 2 puffs every 4-6 hours with spacer (Patient taking differently: Inhale 2 puffs every four (4) hours as needed for wheezing or shortness of breath. with spacer) 1 Inhaler 11   ??? amLODIPine (NORVASC) 10 MG tablet Take 1 tablet (10 mg total) by mouth daily. 30 tablet 11   ??? aspirin (ECOTRIN) 81 MG tablet Take 1 tablet (81 mg total) by mouth daily. 30 tablet 11   ??? carvediloL (COREG) 6.25 MG tablet Take 1 tablet (6.25 mg total) by mouth Two (2) times a day. 180 tablet 3   ??? cholecalciferol, vitamin D3, 50 mcg (2,000 unit) tablet Take 1 tablet (2,000 Units total) by mouth daily.     ??? escitalopram oxalate (LEXAPRO) 10 MG tablet Take 1 tablet (10 mg total) by mouth daily. 30 tablet 0   ??? inhalational spacing device (AEROCHAMBER MV) Spcr 1 each by Miscellaneous route 6XD PRN. 1 each 1   ??? mycophenolate (MYFORTIC) 180 MG EC tablet Take 3 tablets (540 mg total) by mouth Two (2) times a day. 540 tablet 3   ??? tacrolimus (PROGRAF) 1 MG capsule Take 2 capsules in the morning and take 2 capsules at night 450 capsule 3   ??? valGANciclovir (VALCYTE) 450 mg tablet Take 2 tablets (900 mg total) by mouth daily. 60 tablet 2     Current Facility-Administered Medications Medication Dose Route Frequency Provider Last Rate Last Dose   ??? ferumoxytoL (FERAHEME) injection 510 mg  510 mg Intravenous Once Leeroy Bock, MD           Current Antimicrobials:  Valgan    Immunosuppressants:  As above  Objective:  Physical Exam:    Devices:    There were no vitals taken for this visit.    GEN: , NAD  HEENT:wearing a mask, sclearae white, unable to assess conjunctivae  CVS: deferred  Lungs: deferred  Abdomen: deferred  Skin: no facial rash  Lower extremities: deferred      LABS:  Lab Results   Component Value Date    WBC 2.8 (L) 12/24/2018    WBC 2.9 (L) 12/13/2018    WBC 2.3 (L) 12/12/2018    WBC 2.9 (L) 12/11/2018    WBC 2.9 (L) 12/10/2018    WBC 7.2 01/08/2015    WBC 8.5 02/21/2013     Lab Results   Component Value Date    Creatinine 1.85 (H) 12/24/2018    Creatinine 1.57 (H) 12/13/2018    Creatinine 1.70 (H) 12/12/2018    Creatinine 1.71 (H)  12/12/2018    Creatinine 1.72 (H) 12/11/2018    Creatinine 3.16 (H) 01/08/2015    Creatinine 2.64 (H) 08/06/2014    Creatinine 2.13 (H) 05/08/2014    Creatinine 2.11 (H) 02/21/2013     No results found for: ESR, CRP, IGG      MICROBIOLOGY:  I personally reviewed updated microbiological culture and susceptibility data.      IMAGING:  I personally reviewed updated relevant radiological studies.

## 2019-01-02 NOTE — Unmapped (Signed)
Urine was collected and sent to the lab.

## 2019-01-02 NOTE — Unmapped (Signed)
TRANSPLANT SURGERY PROGRESS NOTE    Assessment and Plan  Ethan West is a 43 y.o. 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     Open Bilateral Native Nephrectomy: Fateh's presentation is likely due to the continuing pathology of his ADPKD. Rupture of cysts leading to prolonged hematuria and fatigue from resulting anemia combined with mass effect causing SOB are likely sequelae of his condition. He was counseled on the risk and benefits of native kidney nephrectomy including blood loss, infection, and damage to surrounding structures. All questions were answered including expected hospital course and length of recovery. Plan to remove left native kidney first with R side TF due to increased L sided severity and concerns for complications. Review of CT abdomen/ pelvis from 11/4 demonstrated bilaterally enlarged polycystic kidneys. See imaging results below.    Subjective  Ethan West is a 43 y.o. 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 He was recently hospitalized for the same and discharged with 9 days of cefdinir and an albuterol inhaler from which he experienced minimal relief of symptoms. He denies pain but endorses pressure and a difficulty with taking a full breath. His iron studies have been low and he has been given supplemental iron. He reports hematuria that has persisted for most days over the past 6 weeks. Denies dysuria, pyuria, and urgency. Labs from 12/1 demonstrated PCR positivity for CMV and he has been restarted on Valcyte. Myfortic has been decreased to 360 mg daily.     Objective    Vitals:    01/02/19 0910   BP: 115/84   BP Site: R Arm   BP Position: Sitting   BP Cuff Size: Large   Pulse: 81   Temp: 36.7 ??C (98.1 ??F)   Weight: (!) 103.4 kg (228 lb)   Height: 182.9 cm (6')      Body mass index is 30.92 kg/m??.     Physical Exam: General Appearance:  No acute distress, appears comfortable sitting in exam room.  Lungs: Clear to auscultation bilaterally, equal chest rise, able to speak in full sentences.  Heart:Regular rate and rhythm, no murmurs, rubs or gallops, peripheral pulses 3+ in upper extremities.   Abdomen: Soft, non-tender, non-distended, sensation of pressure to deep palpation, abdomen feels full. Hockey stick scar over LLQ.  Extremities: Warm and well perfused, atraumatic, full ROM.      Data Review:  All lab results last 24 hours:    Recent Results (from the past 24 hour(s))   Lipase Level    Collection Time: 01/02/19  8:58 AM   Result Value Ref Range    Lipase 300 (H) 44 - 232 U/L   Amylase Level    Collection Time: 01/02/19  8:58 AM   Result Value Ref Range    Amylase 103 30 - 110 U/L   Comprehensive Metabolic Panel    Collection Time: 01/02/19  8:58 AM   Result Value Ref Range    Sodium 137 135 - 145 mmol/L    Potassium 5.0 3.5 - 5.0 mmol/L    Chloride 104 98 - 107 mmol/L    Anion Gap 13 7 - 15 mmol/L    CO2 20.0 (L) 22.0 - 30.0 mmol/L    BUN 16 7 - 21 mg/dL    Creatinine 2.95 (H) 0.70 - 1.30 mg/dL    BUN/Creatinine Ratio 10     EGFR  CKD-EPI Non-African American, Male 50 (L) >=60 mL/min/1.43m2    EGFR CKD-EPI African American, Male 52 (L) >=60 mL/min/1.4m2    Glucose 91 70 - 99 mg/dL    Calcium 9.6 8.5 - 16.1 mg/dL    Albumin 4.2 3.5 - 5.0 g/dL    Total Protein 7.0 6.5 - 8.3 g/dL    Total Bilirubin 0.7 0.0 - 1.2 mg/dL    AST 29 19 - 55 U/L    ALT 20 <50 U/L    Alkaline Phosphatase 80 38 - 126 U/L   Magnesium Level    Collection Time: 01/02/19  8:58 AM   Result Value Ref Range    Magnesium 1.6 1.6 - 2.2 mg/dL   Phosphorus Level    Collection Time: 01/02/19  8:58 AM   Result Value Ref Range    Phosphorus 2.7 (L) 2.9 - 4.7 mg/dL   CBC w/ Differential    Collection Time: 01/02/19  8:58 AM   Result Value Ref Range    WBC 2.7 (L) 4.5 - 11.0 10*9/L    RBC 4.60 4.50 - 5.90 10*12/L    HGB 11.4 (L) 13.5 - 17.5 g/dL HCT 09.6 (L) 04.5 - 40.9 %    MCV 82.6 80.0 - 100.0 fL    MCH 24.8 (L) 26.0 - 34.0 pg    MCHC 30.0 (L) 31.0 - 37.0 g/dL    RDW 81.1 (H) 91.4 - 15.0 %    MPV 9.0 7.0 - 10.0 fL    Platelet 201 150 - 440 10*9/L    Neutrophils % 68.0 %    Lymphocytes % 18.6 %    Monocytes % 7.7 %    Eosinophils % 2.5 %    Basophils % 0.3 %    Absolute Neutrophils 1.8 (L) 2.0 - 7.5 10*9/L    Absolute Lymphocytes 0.5 (L) 1.5 - 5.0 10*9/L    Absolute Monocytes 0.2 0.2 - 0.8 10*9/L    Absolute Eosinophils 0.1 0.0 - 0.4 10*9/L    Absolute Basophils 0.0 0.0 - 0.1 10*9/L    Large Unstained Cells 3 0 - 4 %    Microcytosis Slight (A) Not Present    Anisocytosis Moderate (A) Not Present    Hypochromasia Marked (A) Not Present   Bilirubin, Direct    Collection Time: 01/02/19  8:58 AM   Result Value Ref Range    Bilirubin, Direct 0.20 0.00 - 0.40 mg/dL         Imaging:  None today. Most recent CT abdomen/ pelvis without contrast (11/27/2018) showed:     FINDINGS:   ??  Evaluation of the solid organs and vasculature is limited in the absence of intravenous contrast.  ??  LINES AND TUBES: None.  ??  LOWER THORAX: Normal  ??  HEPATOBILIARY:No focal hepatic lesions. The gallbladder is present and is unremarkable. No biliary dilatation.    SPLEEN: Unremarkable.  PANCREAS: Unremarkable.  ??  ADRENALS: Unremarkable.  KIDNEYS/URETERS: Unchanged polycystic disease of the kidneys, with innumerable simple cysts.  ??  BLADDER: Unremarkable.  PELVIC/REPRODUCTIVE ORGANS: Unremarkable.  ??  GI TRACT: No dilated or thick walled loops of bowel.  The appendix is normal.   ??  PERITONEUM/RETROPERITONEUM AND MESENTERY: Small volume peripancreatic free fluid layering along the left perirenal space  LYMPH NODES: No enlarged lymph nodes.   VESSELS: The aorta is normal in caliber.  Scant atherosclerotic calcifications of the abdominal aorta. Normal in caliber. BONES AND SOFT TISSUES: Multilevel degenerative disease of the spine.  No concerning soft  tissue lesions.  No acute fracture, dislocation, or listhesis.  No aggressive lytic or blastic osseus lesions.  ??  ??  IMPRESSION:  Small volume peripancreatic free fluid may represent recent acute pancreatitis. Recommend clinical correlation.  ??  ++++++++++++++++++++  ADDENDUM (11/27/2018 9:39 AM):     There is evidence of likely hemorrhage at the upper part of the left kidney which is likely secondary to cyst rupture/hemorrhage and hemorrhage is predominantly within the confines of the left kidney. There is evidence of asymmetrical prominent perinephric intermediate to high density fluid which could be secondary to likely reactive changes although there may be some small component of left-sided perinephric hematoma.  No evidence of acute pancreatitis.    I have verified all student documentation or findings.  I have personally performed or re-performed the physical exam and medical decision making.    [facility] Domino Holten MD

## 2019-01-04 LAB — CMV DNA, QUANTITATIVE, PCR
CMV QUANT LOG10: 2.66 {Log_IU}/mL — ABNORMAL HIGH (ref <0.00)
CMV QUANT: 459 [IU]/mL — ABNORMAL HIGH (ref <0)

## 2019-01-04 LAB — CMV QUANT LOG10: Lab: 2.66 — ABNORMAL HIGH

## 2019-01-05 DIAGNOSIS — F419 Anxiety disorder, unspecified: Principal | ICD-10-CM

## 2019-01-06 MED ORDER — ESCITALOPRAM 10 MG TABLET
ORAL_TABLET | 1 refills | 0 days | Status: CP
Start: 2019-01-06 — End: ?
  Filled 2019-01-20: qty 90, 90d supply, fill #0

## 2019-01-09 NOTE — Unmapped (Signed)
I spent 10 minutes on the phone with the patient before his telemedicine visit with Dr. Nestor Lewandowsky on 12/18.  I reviewed and updated his medications.  He is feeling better the last few days a little less fatigued.  He has not had as much hematuria the last few days.  We are still waiting on a surgery date of January.  I will reach out to Dr. Norma Fredrickson later as well as Jola Babinski.  He denies fever or swelling.

## 2019-01-10 ENCOUNTER — Encounter: Admit: 2019-01-10 | Discharge: 2019-01-11 | Payer: BLUE CROSS/BLUE SHIELD | Attending: Nephrology | Primary: Nephrology

## 2019-01-10 ENCOUNTER — Encounter: Admit: 2019-01-10 | Discharge: 2019-01-11 | Payer: BLUE CROSS/BLUE SHIELD

## 2019-01-10 DIAGNOSIS — Z940 Kidney transplant status: Principal | ICD-10-CM

## 2019-01-10 MED ORDER — TACROLIMUS 1 MG CAPSULE
ORAL_CAPSULE | 3 refills | 0 days
Start: 2019-01-10 — End: ?

## 2019-01-10 NOTE — Unmapped (Signed)
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  Urine analysis: 42 WBC/ >182 RBC  Urine protein/creatinine ratio: 2.693.  DSA: not checked    Last CMV checked: 459  - 01/02/19  Last BKV checked: no decoy cells - 01/02/19    Prophylaxis -   - Bactrim 80 mg TIW (MWF) (until 12/25/18)    2) Immunosuppression: Prograf 2/2 mg BID / Myfortic 540 mg BID  - Last Prograf level 8.6 on 12/24/18 and Last dose of Prograf: 9 PM  - Goal of 12 hour Prograf trough: 7-9  - Changes in Immunosuppression - cut prograf down to 2/1 BID  - Medications side effects: Minor tremors    3) HTN - Controlled  Goal for B.P - <130/80  Changes in B.P medications - No    4) Anemia - stable, Hgb 11.4  Goal for Hemoglobin >12.0  Last Ferritin 624 on 12/24/18    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    9) Fatigue / SOB - unsure of etiology -   Improved.      Follow-up: Virtual visit in 6-8 weeks     I spent 10 minutes on the real-time audio and video with the patient. I spent an additional 15 minutes on pre- and post-visit activities. The patient was physically located in [place] or a state in which I am permitted to provide care. The patient and/or parent/guardian understood that s/he may incur co-pays and cost sharing, and agreed to the telemedicine visit. The visit was reasonable and appropriate under the circumstances given the patient's presentation at the time.    The patient and/or parent/guardian has been advised of the potential risks and limitations of this mode of treatment (including, but not limited to, the absence of in-person examination) and has agreed to be treated using telemedicine. The patient's/patient's family's questions regarding telemedicine have been answered.     If the visit was completed in an ambulatory setting, the patient and/or parent/guardian has also been advised to contact their provider???s office for worsening conditions, and seek emergency medical treatment and/or call 911 if the patient deems either necessary.      History of Presenting Illness:     Since patient's last visit in the transplant clinic - patient has been doing well in terms of transplant, taking transplant medications regularly, no episodes of rejection and no side effects of medications.    Since patient's last visit in the transplant clinic - patient was admitted to the hospital from 11/4 - 12/09/18 for pyelonephritis, hematuria and a UTI.     He was admitted again from 11/16 -11/20 for dyspnea and L flank pain associated with fevers, as well as hematuria. He was discharged with cefdinir 300 mg BID to complete 14-day course of abx  and an albuterol inhaler from which he experienced minimal relief of symptoms.    He continues receiving his Feraheme infusions. His last one was on 12/25/18.     He tested negative for COVID-19 on 12/30/18.     He continues to follow with the infectious disease clinic for ICH. He presented to the transplant clinic on 01/02/19 for prolonged fatigue, SOB, and hematuria. Imaging shows likely hemorrhage at the upper part of the left kidney and some possible small component of left-sided perinephric hematoma.    His visit today is conducted via video due to the ongoing COVID-19 pandemic.    Today he presents feeling overall well. His home blood pressures are ranging between the 120s/mid 70s-mid 80s. Unfortunately, he does report slight hand tremors whenever he eats or is pouring something into a glass. He said the tremors were bad last week. He will cut prograf down to 2 and 1.     He was admitted 6/2-06/28/18 for living donor renal transplant (from his mother, 28 y.o.), which he received on 06/25/18. Postoperative course was uncomplicated with no delayed graft function.    Patient reports initial diagnosis of polycystic kidney disease in early 2000. He had a ruptured appendix in late '99 vs early '00, and in the process of that workup, was diagnosed with PKD. He reports complications of ruptured cysts every 1-2 years, more recently 2-3 times per year; most recent occurrence was the week prior to his transplant surgery. He experiences pain and hematuria for 3 days to 2 weeks leading up to rupture, with instant relief once the cyst ruptures; may experience some residual bleeding after rupture. He denies history of cysts in the pancreas or liver. Also denies any history of UTI. Reports family history of PKD in his father, sister, and sister's daughter.    Reports HTN control has been inconsistent over the last 20 years. He has been better controlled on current regimen of amlodipine and carvedilol.    He reports history of ibuprofen use for headaches, previously occurring 3 times per week. Headaches are currently resolved. He believes they were occurring back when his BPs were labile.     Last dose of Prograf at 9PM   Diabetes: No   HTN: Yes    Controlled:Yes   CAD/Heartfailure: No   Adherence With Medication: yes    With Follow up: yes    Functional Status: Independent    Review of Systems:     Fever or chills: negative   Sore throat: negative   Fatigue/malaise: negative   Weight loss or gain: negative   New skin rash/lump or bump: negative   Problems with teeth/gums: negative   Chest pain: negative   Cough or shortness of breath: present.  Swelling: negative   Abdominal pain/heartburn/nausea/vomiting or diarrhea: negative   Pain or bleeding when urinating: negative   Twitching/numbness or weakness: negative     Physical Exam:     Not done    Renal Transplant History:    Race: Caucasian   Age of recipient (at time of transplant): 43 y.o.   Cause of kidney disease: ADPKD   Native biopsy: No    Date of transplant: 06/25/2018   Type of transplant: LR    - Donor creatinine: N/A    - Any co morbidities: None    - Infection in donor: none    - Ischemia time: 2h 45m    - Crossmatch: negative    - Donor kidney biopsy (06/25/18): moderate arteriosclerosis  Induction: Campath - Alemtuzumab   Maintenance IS at the time of transplant: tacrolimus, Myfortic   DGF: No    Allergies:   Allergies   Allergen Reactions   ??? Benazepril Rash   ??? Clarithromycin Rash   ??? Penicillins Rash     Tolerates cefepime and cefazolin.        Current Medications:   Current Outpatient Medications   Medication Sig Dispense Refill   ??? amLODIPine (NORVASC) 10 MG tablet Take 1 tablet (10 mg total) by mouth daily. 30 tablet 11   ??? cholecalciferol, vitamin D3, 50 mcg (2,000 unit) tablet Take 1 tablet (2,000 Units total) by mouth daily.     ??? escitalopram oxalate (LEXAPRO) 10 MG tablet TAKE 1 TABLET BY MOUTH EVERY DAY 90 tablet 1   ??? mycophenolate (MYFORTIC) 180 MG EC tablet Take 3 tablets (540 mg total) by mouth Two (2) times a day. 540 tablet 3   ??? tacrolimus (PROGRAF) 1 MG capsule Take 2 capsules in the morning and take 2 capsules at night 450 capsule 3 ??? valGANciclovir (VALCYTE) 450 mg tablet Take 2 tablets (900 mg total) by mouth daily. 60 tablet 2     Current Facility-Administered Medications   Medication Dose Route Frequency Provider Last Rate Last Dose   ??? ferumoxytoL (FERAHEME) injection 510 mg  510 mg Intravenous Once Leeroy Bock, MD           Past Medical History:   Past Medical History:   Diagnosis Date   ??? ADPKD (autosomal dominant polycystic kidney disease)    ??? Hypertension    ??? Kidney stone    ??? Polycystic kidney disease         Laboratory studies:     No results found for this or any previous visit (from the past 170 hour(s)).    Scribe's Attestation: Leeroy Bock, MD obtained and performed the history, physical exam and medical decision making elements that were entered into the chart.  Signed by Sharion Balloon, Scribe, on January 10, 2019 at 1:48 PM.    Documentation assistance provided by the Scribe. I was present during the time the encounter was recorded. The information recorded by the Scribe was done at my direction and has been reviewed and validated by me.

## 2019-01-13 LAB — CMV DNA, QUANTITATIVE, PCR: CMV QUANT LOG10: 2.23 {Log_IU}/mL — ABNORMAL HIGH (ref <0.00)

## 2019-01-13 LAB — CMV QUANT: Lab: 168 — ABNORMAL HIGH

## 2019-01-16 DIAGNOSIS — Z940 Kidney transplant status: Principal | ICD-10-CM

## 2019-01-16 MED ORDER — VALGANCICLOVIR 450 MG TABLET
ORAL_TABLET | Freq: Every day | ORAL | 2 refills | 30.00000 days | Status: CP
Start: 2019-01-16 — End: 2019-04-16

## 2019-01-20 ENCOUNTER — Ambulatory Visit: Admit: 2019-01-20 | Discharge: 2019-01-21 | Payer: BLUE CROSS/BLUE SHIELD

## 2019-01-20 DIAGNOSIS — Z940 Kidney transplant status: Principal | ICD-10-CM

## 2019-01-20 DIAGNOSIS — Z79899 Other long term (current) drug therapy: Principal | ICD-10-CM

## 2019-01-20 LAB — COMPREHENSIVE METABOLIC PANEL
ALBUMIN: 4.2 g/dL (ref 3.5–5.0)
ALKALINE PHOSPHATASE: 79 U/L (ref 38–126)
ANION GAP: 8 mmol/L (ref 7–15)
AST (SGOT): 37 U/L (ref 19–55)
BILIRUBIN TOTAL: 0.7 mg/dL (ref 0.0–1.2)
BLOOD UREA NITROGEN: 17 mg/dL (ref 7–21)
BUN / CREAT RATIO: 10
CALCIUM: 9.8 mg/dL (ref 8.5–10.2)
CHLORIDE: 107 mmol/L (ref 98–107)
CO2: 23 mmol/L (ref 22.0–30.0)
EGFR CKD-EPI AA MALE: 58 mL/min/{1.73_m2} — ABNORMAL LOW (ref >=60)
EGFR CKD-EPI NON-AA MALE: 50 mL/min/{1.73_m2} — ABNORMAL LOW (ref >=60)
POTASSIUM: 4 mmol/L (ref 3.5–5.0)
PROTEIN TOTAL: 7.2 g/dL (ref 6.5–8.3)
SODIUM: 138 mmol/L (ref 135–145)

## 2019-01-20 LAB — LIPID PANEL
CHOLESTEROL: 140 mg/dL (ref 100–199)
TRIGLYCERIDES: 102 mg/dL (ref 1–149)
VLDL CHOLESTEROL CAL: 20.4 mg/dL (ref 11–50)

## 2019-01-20 LAB — CBC W/ AUTO DIFF
BASOPHILS ABSOLUTE COUNT: 0 10*9/L (ref 0.0–0.1)
BASOPHILS RELATIVE PERCENT: 0.1 %
EOSINOPHILS ABSOLUTE COUNT: 0.1 10*9/L (ref 0.0–0.4)
EOSINOPHILS RELATIVE PERCENT: 1.8 %
HEMATOCRIT: 44.3 % (ref 41.0–53.0)
HEMOGLOBIN: 13.5 g/dL (ref 13.5–17.5)
LYMPHOCYTES ABSOLUTE COUNT: 0.5 10*9/L — ABNORMAL LOW (ref 1.5–5.0)
LYMPHOCYTES RELATIVE PERCENT: 14.2 %
MEAN CORPUSCULAR HEMOGLOBIN CONC: 30.4 g/dL — ABNORMAL LOW (ref 31.0–37.0)
MEAN CORPUSCULAR VOLUME: 86.5 fL (ref 80.0–100.0)
MEAN PLATELET VOLUME: 8.7 fL (ref 7.0–10.0)
MONOCYTES ABSOLUTE COUNT: 0.1 10*9/L — ABNORMAL LOW (ref 0.2–0.8)
MONOCYTES RELATIVE PERCENT: 1.8 %
NEUTROPHILS RELATIVE PERCENT: 81.7 %
PLATELET COUNT: 172 10*9/L (ref 150–440)
RED BLOOD CELL COUNT: 5.13 10*12/L (ref 4.50–5.90)
RED CELL DISTRIBUTION WIDTH: 19.4 % — ABNORMAL HIGH (ref 12.0–15.0)
WBC ADJUSTED: 3.2 10*9/L — ABNORMAL LOW (ref 4.5–11.0)

## 2019-01-20 LAB — EGFR CKD-EPI NON-AA MALE: Lab: 50 — ABNORMAL LOW

## 2019-01-20 LAB — TACROLIMUS, TROUGH: Lab: 7.5

## 2019-01-20 LAB — SMEAR REVIEW

## 2019-01-20 LAB — TRIGLYCERIDES: Triglyceride:MCnc:Pt:Ser/Plas:Qn:: 102

## 2019-01-20 LAB — SLIDE REVIEW

## 2019-01-20 LAB — EOSINOPHILS ABSOLUTE COUNT: Eosinophils:NCnc:Pt:Bld:Qn:Automated count: 0.1

## 2019-01-20 LAB — PHOSPHORUS: Phosphate:MCnc:Pt:Ser/Plas:Qn:: 2.6 — ABNORMAL LOW

## 2019-01-20 LAB — MAGNESIUM: Magnesium:MCnc:Pt:Ser/Plas:Qn:: 1.4 — ABNORMAL LOW

## 2019-01-20 MED ORDER — VALGANCICLOVIR 450 MG TABLET: 900 mg | tablet | Freq: Every day | 0 refills | 60 days | Status: AC

## 2019-01-20 MED ORDER — VALGANCICLOVIR 450 MG TABLET
ORAL_TABLET | Freq: Every day | ORAL | 0 refills | 60.00000 days | Status: CP
Start: 2019-01-20 — End: 2019-01-20
  Filled 2019-02-03: qty 120, 30d supply, fill #0

## 2019-01-20 MED FILL — ESCITALOPRAM 10 MG TABLET: 90 days supply | Qty: 90 | Fill #0 | Status: AC

## 2019-01-20 NOTE — Unmapped (Signed)
Change in Valganciclovir dosage increase. Co-pay $0.00.

## 2019-01-20 NOTE — Unmapped (Signed)
Lab orders entered

## 2019-01-21 LAB — VITAMIN D 25 HYDROXY: VITAMIN D, TOTAL (25OH): 35.9 ng/mL (ref 20.0–80.0)

## 2019-01-21 LAB — CMV COMMENT: Lab: 0

## 2019-01-21 LAB — CMV DNA, QUANTITATIVE, PCR: CMV VIRAL LD: NOT DETECTED

## 2019-01-21 LAB — VITAMIN D, TOTAL (25OH): Lab: 35.9

## 2019-01-21 MED ORDER — VALGANCICLOVIR 450 MG TABLET
ORAL_TABLET | Freq: Two times a day (BID) | ORAL | 6 refills | 30 days
Start: 2019-01-21 — End: ?

## 2019-01-21 NOTE — Unmapped (Addendum)
12/29: checking patient's wam profile, valcyte rx was transferred out today to a [facility] in [place]. Clinic is aware of this, and pt let coordinator know that it was all taken care of -ef      [facility] Clinical Assessment & Refill Coordination Note    Ethan West, DOB: 07/07/1975  Phone: [phone] (home)     All above HIPAA information was verified with patient.     Was a Nurse, learning disability used for this call? No    Specialty Medication(s):   Transplant: valgancyclovir 450mg      Current Outpatient Medications   Medication Sig Dispense Refill   ??? amLODIPine (NORVASC) 10 MG tablet Take 1 tablet (10 mg total) by mouth daily. 30 tablet 11   ??? cholecalciferol, vitamin D3, 50 mcg (2,000 unit) tablet Take 1 tablet (2,000 Units total) by mouth daily.     ??? escitalopram oxalate (LEXAPRO) 10 MG tablet TAKE 1 TABLET BY MOUTH EVERY DAY 90 tablet 1   ??? mycophenolate (MYFORTIC) 180 MG EC tablet Take 3 tablets (540 mg total) by mouth Two (2) times a day. 540 tablet 3   ??? tacrolimus (PROGRAF) 1 MG capsule Take 2 capsules in the morning and take 1 capsules at night 450 capsule 3   ??? valGANciclovir (VALCYTE) 450 mg tablet Take 2 tablets (900 mg total) by mouth Two (2) times a day. 120 tablet 0     Current Facility-Administered Medications   Medication Dose Route Frequency Provider Last Rate Last Dose   ??? ferumoxytoL (FERAHEME) injection 510 mg  510 mg Intravenous Once Leeroy Bock, MD            Changes to medications: see valcyte below    Allergies   Allergen Reactions   ??? Benazepril Rash   ??? Clarithromycin Rash   ??? Penicillins Rash     Tolerates cefepime and cefazolin.       Changes to allergies: No    SPECIALTY MEDICATION ADHERENCE     valganciclovir 450mg   : 0 days of medicine on hand       Medication Adherence    Patient reported X missed doses in the last month: 0  Specialty Medication: valganciclovir 450mg  Specialty medication(s) dose(s) confirmed: valganciclovir dose was inc on 12/10 to 2bid (from 2daily) but new rx was not sent. when pt went back to 2bid ins would not fill until 12/30. COP tried to get ins override but it was denied as no rx ever sent for higher dose. COP requested new rx from clinic for new dosing, but pt left before new rx could be sent. [facility] for 2bid dosing     Are there any concerns with adherence? Yes: pt is on road in [place] and won't be back for 1 week. advised him to find local pharmacy asap (tonight or tomorrow am) to get rx transferred from [place] (he has our phone number) so if they need to order med can do it asap. advised him should not miss doses, i will message clinic as well on this    Adherence counseling provided? Not needed    CLINICAL MANAGEMENT AND INTERVENTION      Clinical Benefit Assessment:    Do you feel the medicine is effective or helping your condition? Yes    Clinical Benefit counseling provided? Not needed    Adverse Effects Assessment:    Are you experiencing any side effects? No    Are you experiencing difficulty administering your  medicine? No    Quality of Life Assessment:    How many days over the past month did your transplant  keep you from your normal activities? For example, brushing your teeth or getting up in the morning. 0    Have you discussed this with your provider? Not needed    Therapy Appropriateness:    Is therapy appropriate? Yes, therapy is appropriate and should be continued    DISEASE/MEDICATION-SPECIFIC INFORMATION      N/A    PATIENT SPECIFIC NEEDS     ? Does the patient have any physical, cognitive, or cultural barriers? No    ? Is the patient high risk? No     ? Does the patient require a Care Management Plan? No     ? Does the patient require physician intervention or other additional services (i.e. nutrition, smoking cessation, social work)? No      SHIPPING     Specialty Medication(s) to be Shipped:   na Other medication(s) to be shipped: na     Changes to insurance: No    Delivery Scheduled: Patient declined refill at this time due to pt will contact local pharmacy in [facility] to transfer this med and have it filled there.     Medication will be delivered via na to the confirmed na address in [facility].    The patient will receive a drug information handout for each medication shipped and additional FDA Medication Guides as required.  Verified that patient has previously received a Conservation officer, historic buildings.    All of the patient's questions and concerns have been addressed.    Thad Ranger   [facility] Specialty Pharmacist

## 2019-01-21 NOTE — Unmapped (Signed)
Called patient no answer.   Left call back # and requested a call back.   Sent My Chart message requesting geographically location to help assist with getting Valcyte locally.

## 2019-01-24 MED ORDER — CHOLECALCIFEROL (VITAMIN D3) 50 MCG (2,000 UNIT) CAPSULE
Freq: Every day | ORAL | 0 refills | 100 days | Status: CP
Start: 2019-01-24 — End: ?

## 2019-01-31 DIAGNOSIS — Z940 Kidney transplant status: Principal | ICD-10-CM

## 2019-01-31 DIAGNOSIS — Z79899 Other long term (current) drug therapy: Principal | ICD-10-CM

## 2019-02-03 ENCOUNTER — Ambulatory Visit: Admit: 2019-02-03 | Discharge: 2019-02-04 | Payer: BLUE CROSS/BLUE SHIELD | Attending: Family | Primary: Family

## 2019-02-03 ENCOUNTER — Encounter: Admit: 2019-02-03 | Discharge: 2019-02-04 | Payer: BLUE CROSS/BLUE SHIELD

## 2019-02-03 LAB — CBC W/ AUTO DIFF
BASOPHILS ABSOLUTE COUNT: 0 10*9/L (ref 0.0–0.1)
EOSINOPHILS ABSOLUTE COUNT: 0.1 10*9/L (ref 0.0–0.4)
HEMATOCRIT: 49.9 % (ref 41.0–53.0)
HEMOGLOBIN: 15.3 g/dL (ref 13.5–17.5)
LARGE UNSTAINED CELLS: 1 % (ref 0–4)
LYMPHOCYTES ABSOLUTE COUNT: 0.5 10*9/L — ABNORMAL LOW (ref 1.5–5.0)
LYMPHOCYTES RELATIVE PERCENT: 12.6 %
MEAN CORPUSCULAR HEMOGLOBIN CONC: 30.7 g/dL — ABNORMAL LOW (ref 31.0–37.0)
MEAN CORPUSCULAR HEMOGLOBIN: 26.5 pg (ref 26.0–34.0)
MEAN PLATELET VOLUME: 7.7 fL (ref 7.0–10.0)
MONOCYTES ABSOLUTE COUNT: 0.2 10*9/L (ref 0.2–0.8)
MONOCYTES RELATIVE PERCENT: 4.7 %
NEUTROPHILS ABSOLUTE COUNT: 2.9 10*9/L (ref 2.0–7.5)
NEUTROPHILS RELATIVE PERCENT: 79.7 %
PLATELET COUNT: 258 10*9/L (ref 150–440)
RED BLOOD CELL COUNT: 5.78 10*12/L (ref 4.50–5.90)
WBC ADJUSTED: 3.6 10*9/L — ABNORMAL LOW (ref 4.5–11.0)

## 2019-02-03 LAB — RED CELL DISTRIBUTION WIDTH: Lab: 18.5 — ABNORMAL HIGH

## 2019-02-03 LAB — RENAL FUNCTION PANEL
ALBUMIN: 4.3 g/dL (ref 3.5–5.0)
ANION GAP: 8 mmol/L (ref 7–15)
BLOOD UREA NITROGEN: 15 mg/dL (ref 7–21)
BUN / CREAT RATIO: 10
CALCIUM: 9.8 mg/dL (ref 8.5–10.2)
CO2: 23 mmol/L (ref 22.0–30.0)
CREATININE: 1.44 mg/dL — ABNORMAL HIGH (ref 0.70–1.30)
EGFR CKD-EPI AA MALE: 68 mL/min/{1.73_m2} (ref >=60)
EGFR CKD-EPI NON-AA MALE: 59 mL/min/{1.73_m2} — ABNORMAL LOW (ref >=60)
GLUCOSE RANDOM: 105 mg/dL — ABNORMAL HIGH (ref 70–99)
PHOSPHORUS: 2.7 mg/dL — ABNORMAL LOW (ref 2.9–4.7)
POTASSIUM: 4.2 mmol/L (ref 3.5–5.0)
SODIUM: 136 mmol/L (ref 135–145)

## 2019-02-03 LAB — TACROLIMUS, TROUGH: Lab: 8.9

## 2019-02-03 LAB — MAGNESIUM: Magnesium:MCnc:Pt:Ser/Plas:Qn:: 1.4 — ABNORMAL LOW

## 2019-02-03 LAB — EGFR CKD-EPI AA MALE: Lab: 68

## 2019-02-03 MED FILL — AMLODIPINE 10 MG TABLET: ORAL | 30 days supply | Qty: 30 | Fill #4

## 2019-02-03 MED FILL — VALGANCICLOVIR 450 MG TABLET: 30 days supply | Qty: 120 | Fill #0 | Status: AC

## 2019-02-03 MED FILL — AMLODIPINE 10 MG TABLET: 30 days supply | Qty: 30 | Fill #4 | Status: AC

## 2019-02-03 NOTE — Unmapped (Signed)
COVID Pre-Procedure Intake Form     Assessment     [name] is a 44 y.o. male presenting to [facility] for COVID testing.     Plan     If no testing performed, pt counseled on routine care for respiratory illness.  If testing performed, COVID sent.  Patient directed to Home given findings during today's visit.    Subjective     [name] is a 44 y.o. male who [facility] the [facility] with complaints of the following:    Exposure History: In the last 21 days?     Have you traveled outside of [place]? No               Have you been in close contact with someone confirmed by a test to have COVID? (Close contact is within 6 feet for at least 10 minutes) No       Have you worked in a health care facility? No     Lived or worked facility like a nursing home, group home, or assisted living?    No         Are you scheduled to have surgery or a procedure in the next 3 days? Yes               Are you scheduled to receive cancer chemotherapy within the next 7 days?    No     Have you ever been tested before for COVID-19 with a swab of your nose? Yes: When: n/a, Where: n/a   Are you a healthcare worker being tested so to return to work No     Right now,  do you have any of the following that developed over the past 7 days (as stated by patient on intake form):    Subjective fever (felt feverish) No   Chills (especially repeated shaking chills) No   Severe fatigue (felt very tired) No   Muscle aches No   Runny nose No   Sore throat No   Loss of taste or smell No   Cough (new onset or worsening of chronic cough) No   Shortness of breath No   Nausea or vomiting No   Headache No   Abdominal Pain No   Diarrhea (3 or more loose stools in last 24 hours) No Scribe's Attestation: Paulita Fujita, FNP obtained and performed the history, physical exam and medical decision making elements that were entered into the chart.  Signed by Edison Simon serving as Scribe, on 02/03/2019 11:23 AM      The documentation recorded by the scribe accurately reflects the service I personally performed and the decisions made by me. Aida Puffer, FNP  February 03, 2019 1:21 PM

## 2019-02-03 NOTE — Unmapped (Signed)
[facility] Refill Coordination Note    Specialty Medication(s) to be Shipped:   Transplant: valgancyclovir 450mg  **[facility]** per pt request    Other medication(s) to be shipped: amlodipine 10mg      Ethan West, DOB: [birth year]  Phone: [phone] (home)       All above HIPAA information was verified with patient.     Was a Nurse, learning disability used for this call? No    Completed refill call assessment today to schedule patient's medication shipment from the [facility] [phone]).       Specialty medication(s) and dose(s) confirmed: Regimen is correct and unchanged.   Changes to medications: Ethan West reports no changes at this time.  Changes to insurance: No  Questions for the pharmacist: No    Confirmed patient received Welcome Packet with first shipment. The patient will receive a drug information handout for each medication shipped and additional FDA Medication Guides as required.       DISEASE/MEDICATION-SPECIFIC INFORMATION        N/A    SPECIALTY MEDICATION ADHERENCE     Medication Adherence    Patient reported X missed doses in the last month: 0  Specialty Medication: valGANciclovir 450 mg tablet (VALCYTE)  Patient is on additional specialty medications: No          Valganciclovir 450 mg: 1 day of medicine on hand     SHIPPING     Shipping address confirmed in Epic.     Delivery Scheduled: Yes, Expected medication delivery date: 02/04/2019.     Medication will be delivered via UPS to the prescription address in Epic WAM.    Ethan West [facility] Specialty Technician

## 2019-02-04 LAB — CMV QUANT LOG10: Lab: 0

## 2019-02-04 LAB — CMV DNA, QUANTITATIVE, PCR

## 2019-02-05 ENCOUNTER — Ambulatory Visit
Admit: 2019-02-05 | Discharge: 2019-02-10 | Disposition: A | Discharge status: Home / Self Care | Payer: BLUE CROSS/BLUE SHIELD | Admitting: Surgery

## 2019-02-05 ENCOUNTER — Encounter
Admit: 2019-02-05 | Discharge: 2019-02-10 | Disposition: A | Discharge status: Home / Self Care | Payer: BLUE CROSS/BLUE SHIELD | Attending: Anesthesiology | Admitting: Surgery

## 2019-02-05 LAB — BLOOD GAS CRITICAL CARE PANEL, ARTERIAL
BASE EXCESS ARTERIAL: -2.1 — ABNORMAL LOW (ref -2.0–2.0)
BASE EXCESS ARTERIAL: -7 — ABNORMAL LOW (ref -2.0–2.0)
BASE EXCESS ARTERIAL: -6.6 — ABNORMAL LOW (ref -2.0–2.0)
BASE EXCESS ARTERIAL: -6.6 — ABNORMAL LOW (ref -2.0–2.0)
CALCIUM IONIZED ARTERIAL (MG/DL): 5.03 mg/dL (ref 4.40–5.40)
CALCIUM IONIZED ARTERIAL (MG/DL): 4.05 mg/dL — ABNORMAL LOW (ref 4.40–5.40)
CALCIUM IONIZED ARTERIAL (MG/DL): 5.32 mg/dL (ref 4.40–5.40)
FIO2 ARTERIAL: 75
FIO2 ARTERIAL: 40
FIO2 ARTERIAL: 45
FIO2 ARTERIAL: 28
GLUCOSE WHOLE BLOOD: 104 mg/dL (ref 70–179)
GLUCOSE WHOLE BLOOD: 153 mg/dL (ref 70–179)
GLUCOSE WHOLE BLOOD: 133 mg/dL (ref 70–179)
GLUCOSE WHOLE BLOOD: 188 mg/dL — ABNORMAL HIGH (ref 70–179)
HCO3 ARTERIAL: 23 mmol/L (ref 22–27)
HCO3 ARTERIAL: 19 mmol/L — ABNORMAL LOW (ref 22–27)
HCO3 ARTERIAL: 19 mmol/L — ABNORMAL LOW (ref 22–27)
HCO3 ARTERIAL: 21 mmol/L — ABNORMAL LOW (ref 22–27)
HEMOGLOBIN BLOOD GAS: 10.4 g/dL — ABNORMAL LOW (ref 13.50–17.50)
HEMOGLOBIN BLOOD GAS: 8.4 g/dL — ABNORMAL LOW (ref 13.50–17.50)
HEMOGLOBIN BLOOD GAS: 8.7 g/dL — ABNORMAL LOW (ref 13.50–17.50)
LACTATE BLOOD ARTERIAL: 0.6 mmol/L (ref <1.3)
LACTATE BLOOD ARTERIAL: 0.7 mmol/L (ref <1.3)
LACTATE BLOOD ARTERIAL: 1.1 mmol/L (ref <1.3)
LACTATE BLOOD ARTERIAL: 1.4 mmol/L — ABNORMAL HIGH (ref <1.3)
O2 SATURATION ARTERIAL: >100 % — ABNORMAL HIGH (ref 94.0–100.0)
O2 SATURATION ARTERIAL: 99.6 % (ref 94.0–100.0)
O2 SATURATION ARTERIAL: 99.7 % (ref 94.0–100.0)
O2 SATURATION ARTERIAL: 98.7 % (ref 94.0–100.0)
O2 SATURATION ARTERIAL: 99.1 % (ref 94.0–100.0)
PCO2 ARTERIAL: 40.6 mmHg (ref 35.0–45.0)
PCO2 ARTERIAL: 42.8 mmHg (ref 35.0–45.0)
PCO2 ARTERIAL: 47.2 mmHg — ABNORMAL HIGH (ref 35.0–45.0)
PH ARTERIAL: 7.36 (ref 7.35–7.45)
PH ARTERIAL: 7.28 — ABNORMAL LOW (ref 7.35–7.45)
PH ARTERIAL: 7.29 — ABNORMAL LOW (ref 7.35–7.45)
PH ARTERIAL: 7.26 — ABNORMAL LOW (ref 7.35–7.45)
PH ARTERIAL: 7.27 — ABNORMAL LOW (ref 7.35–7.45)
PO2 ARTERIAL: 187 mmHg — ABNORMAL HIGH (ref 80.0–110.0)
PO2 ARTERIAL: 198 mmHg — ABNORMAL HIGH (ref 80.0–110.0)
PO2 ARTERIAL: 137 mmHg — ABNORMAL HIGH (ref 80.0–110.0)
POTASSIUM WHOLE BLOOD: 4.9 mmol/L — ABNORMAL HIGH (ref 3.4–4.6)
POTASSIUM WHOLE BLOOD: 4.8 mmol/L — ABNORMAL HIGH (ref 3.4–4.6)
POTASSIUM WHOLE BLOOD: 4.6 mmol/L (ref 3.4–4.6)
POTASSIUM WHOLE BLOOD: 4.8 mmol/L — ABNORMAL HIGH (ref 3.4–4.6)
SODIUM WHOLE BLOOD: 140 mmol/L (ref 135–145)
SODIUM WHOLE BLOOD: 139 mmol/L (ref 135–145)
SODIUM WHOLE BLOOD: 138 mmol/L (ref 135–145)
SODIUM WHOLE BLOOD: 139 mmol/L (ref 135–145)

## 2019-02-05 LAB — HLA DS POST TRANSPLANT

## 2019-02-05 LAB — CBC
HEMOGLOBIN: 10.1 g/dL — ABNORMAL LOW (ref 13.5–17.5)
MEAN CORPUSCULAR HEMOGLOBIN CONC: 31.3 g/dL (ref 31.0–37.0)
MEAN CORPUSCULAR HEMOGLOBIN: 27.7 pg (ref 26.0–34.0)
MEAN CORPUSCULAR VOLUME: 88.5 fL (ref 80.0–100.0)
MEAN PLATELET VOLUME: 8.5 fL (ref 7.0–10.0)
PLATELET COUNT: 166 10*9/L (ref 150–440)
RED BLOOD CELL COUNT: 3.65 10*12/L — ABNORMAL LOW (ref 4.50–5.90)
RED CELL DISTRIBUTION WIDTH: 18.1 % — ABNORMAL HIGH (ref 12.0–15.0)
WBC ADJUSTED: 4.2 10*9/L — ABNORMAL LOW (ref 4.5–11.0)

## 2019-02-05 LAB — RED BLOOD CELL COUNT: Lab: 3.65 — ABNORMAL LOW

## 2019-02-05 LAB — EXTEM
EXTEM ALPHA ANGLE: 56 degrees — ABNORMAL LOW (ref 65–80)
EXTEM AMPLITUDE AT 10 MINUTES: 37 mm — ABNORMAL LOW (ref 44–66)
EXTEM AMPLITUDE AT 10 MINUTES: 42 mm — ABNORMAL LOW (ref 44–66)
EXTEM AMPLITUDE AT 20 MINUTES: 45 mm — ABNORMAL LOW (ref 50–70)
EXTEM AMPLITUDE AT 20 MINUTES: 50 mm (ref 50–70)
EXTEM CLOT FORMATION TIME: 191 s — ABNORMAL HIGH (ref 48–127)
EXTEM CLOT FORMATION TIME: 141 s — ABNORMAL HIGH (ref 48–127)
EXTEM CLOTTING TIME: 100 s — ABNORMAL HIGH (ref 43–82)
EXTEM CLOTTING TIME: 75 s (ref 43–82)
EXTEM LYSIS INDEX AT 30 MINUTES: 100 %
EXTEM MAXIMUM CLOT FIRMNESS: 47 mm — ABNORMAL LOW (ref 52–70)
EXTEM MAXIMUM CLOT FIRMNESS: 52 mm (ref 52–70)
EXTEM MAXIMUM LYSIS: 3 %

## 2019-02-05 LAB — HLA CLASS 1 ANTIBODY RESULT: Lab: NEGATIVE

## 2019-02-05 LAB — DONOR HLA-C ANTIGEN #2

## 2019-02-05 LAB — EXTEM LYSIS INDEX AT 30 MINUTES
Lab: 100
Lab: 100

## 2019-02-05 LAB — FIBTEM
FIBTEM AMPLITUDE AT 20 MINUTES: 10 mm (ref 7–24)
FIBTEM MAXIMUM CLOT FIRMNESS: 9 mm (ref 7–24)
FIBTEM MAXIMUM CLOT FIRMNESS: 10 mm (ref 7–24)

## 2019-02-05 LAB — FSAB CLASS 2 ANTIBODY SPECIFICITY: HLA CL2 AB RESULT: NEGATIVE

## 2019-02-05 LAB — BASE EXCESS ARTERIAL
Base excess:SCnc:Pt:BldA:Qn:Calculated: -5.2 — ABNORMAL LOW
Base excess:SCnc:Pt:BldA:Qn:Calculated: -7 — ABNORMAL LOW

## 2019-02-05 LAB — HEMOGLOBIN BLOOD GAS: Hemoglobin:MCnc:Pt:Bld:Qn:: 8.4 — ABNORMAL LOW

## 2019-02-05 LAB — FIBTEM AMPLITUDE AT 20 MINUTES
Lab: 10
Lab: 10

## 2019-02-05 LAB — HLA CL2 AB COMMENT

## 2019-02-05 LAB — LACTATE BLOOD ARTERIAL: Lactate:SCnc:Pt:BldA:Qn:: 0.6

## 2019-02-05 LAB — O2 SATURATION ARTERIAL: Oxygen saturation:MFr:Pt:BldA:Qn:: 98.7

## 2019-02-05 NOTE — Unmapped (Signed)
DAY OF SURGERY UPDATE    H&P reviewed. The patient was examined and there are no changes to the H&P.   No significant changes since last saw [place] in clinic. Had no questions about procedure. Accompanied by wife.      Note from Clinic appointment 01/02/2019:    TRANSPLANT SURGERY PROGRESS NOTE  ??  Assessment and Plan  Ethan West is a 44 y.o. 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   ??  Open Bilateral Native Nephrectomy: Eriel's presentation is likely due to the continuing pathology of his ADPKD. Rupture of cysts leading to prolonged hematuria and fatigue from resulting anemia combined with mass effect causing SOB are likely sequelae of his condition. He was counseled on the risk and benefits of native kidney nephrectomy including blood loss, infection, and damage to surrounding structures. All questions were answered including expected hospital course and length of recovery. Plan to remove left native kidney first with R side TF due to increased L sided severity and concerns for complications. Review of CT abdomen/ pelvis from 11/4 demonstrated bilaterally enlarged polycystic kidneys. See imaging results below.  ??  Subjective  Ethan West is a 44 y.o. 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 He was recently hospitalized for the same and discharged with 9 days of cefdinir and an albuterol inhaler from which he experienced minimal relief of symptoms. He denies pain but endorses pressure and a difficulty with taking a full breath. His iron studies have been low and he has been given supplemental iron. He reports hematuria that has persisted for most days over the past 6 weeks. Denies dysuria, pyuria, and urgency. Labs from 12/1 demonstrated PCR positivity for CMV and he has been restarted on Valcyte. Myfortic has been decreased to 360 mg daily. Objective  ??  Vitals       Vitals:   ?? 01/02/19 0910   BP: 115/84   BP Site: R Arm   BP Position: Sitting   BP Cuff Size: Large   Pulse: 81   Temp: 36.7 ??C (98.1 ??F)   Weight: (!) 103.4 kg (228 lb)   Height: 182.9 cm (6')         Body mass index is 30.92 kg/m??.   ??  Physical Exam:  ??  General Appearance:  No acute distress, appears comfortable sitting in exam room.  Lungs: Clear to auscultation bilaterally, equal chest rise, able to speak in full sentences.  Heart:Regular rate and rhythm, no murmurs, rubs or gallops, peripheral pulses 3+ in upper extremities.   Abdomen: Soft, non-tender, non-distended, sensation of pressure to deep palpation, abdomen feels full. Hockey stick scar over LLQ.  Extremities: Warm and well perfused, atraumatic, full ROM.  ??  ??  Data Review:  All lab results last 24 hours:    Recent Results         Recent Results (from the past 24 hour(s))   Lipase Level   ?? Collection Time: 01/02/19  8:58 AM   Result Value Ref Range   ?? Lipase 300 (H) 44 - 232 U/L   Amylase Level   ?? Collection Time: 01/02/19  8:58 AM   Result Value Ref Range   ?? Amylase 103 30 - 110 U/L   Comprehensive Metabolic Panel   ?? Collection Time: 01/02/19  8:58 AM   Result Value Ref Range   ?? Sodium 137  135 - 145 mmol/L   ?? Potassium 5.0 3.5 - 5.0 mmol/L   ?? Chloride 104 98 - 107 mmol/L   ?? Anion Gap 13 7 - 15 mmol/L   ?? CO2 20.0 (L) 22.0 - 30.0 mmol/L   ?? BUN 16 7 - 21 mg/dL   ?? Creatinine 1.66 (H) 0.70 - 1.30 mg/dL   ?? BUN/Creatinine Ratio 10 ??   ?? EGFR CKD-EPI Non-African American, Male 50 (L) >=60 mL/min/1.52m2   ?? EGFR CKD-EPI African American, Male 48 (L) >=60 mL/min/1.37m2   ?? Glucose 91 70 - 99 mg/dL   ?? Calcium 9.6 8.5 - 10.2 mg/dL   ?? Albumin 4.2 3.5 - 5.0 g/dL   ?? Total Protein 7.0 6.5 - 8.3 g/dL   ?? Total Bilirubin 0.7 0.0 - 1.2 mg/dL   ?? AST 29 19 - 55 U/L   ?? ALT 20 <50 U/L   ?? Alkaline Phosphatase 80 38 - 126 U/L   Magnesium Level   ?? Collection Time: 01/02/19  8:58 AM   Result Value Ref Range ?? Magnesium 1.6 1.6 - 2.2 mg/dL   Phosphorus Level   ?? Collection Time: 01/02/19  8:58 AM   Result Value Ref Range   ?? Phosphorus 2.7 (L) 2.9 - 4.7 mg/dL   CBC w/ Differential   ?? Collection Time: 01/02/19  8:58 AM   Result Value Ref Range   ?? WBC 2.7 (L) 4.5 - 11.0 10*9/L   ?? RBC 4.60 4.50 - 5.90 10*12/L   ?? HGB 11.4 (L) 13.5 - 17.5 g/dL   ?? HCT 38.0 (L) 41.0 - 53.0 %   ?? MCV 82.6 80.0 - 100.0 fL   ?? MCH 24.8 (L) 26.0 - 34.0 pg   ?? MCHC 30.0 (L) 31.0 - 37.0 g/dL   ?? RDW 18.4 (H) 12.0 - 15.0 %   ?? MPV 9.0 7.0 - 10.0 fL   ?? Platelet 201 150 - 440 10*9/L   ?? Neutrophils % 68.0 %   ?? Lymphocytes % 18.6 %   ?? Monocytes % 7.7 %   ?? Eosinophils % 2.5 %   ?? Basophils % 0.3 %   ?? Absolute Neutrophils 1.8 (L) 2.0 - 7.5 10*9/L   ?? Absolute Lymphocytes 0.5 (L) 1.5 - 5.0 10*9/L   ?? Absolute Monocytes 0.2 0.2 - 0.8 10*9/L   ?? Absolute Eosinophils 0.1 0.0 - 0.4 10*9/L   ?? Absolute Basophils 0.0 0.0 - 0.1 10*9/L   ?? Large Unstained Cells 3 0 - 4 %   ?? Microcytosis Slight (A) Not Present   ?? Anisocytosis Moderate (A) Not Present   ?? Hypochromasia Marked (A) Not Present   Bilirubin, Direct   ?? Collection Time: 01/02/19  8:58 AM   Result Value Ref Range   ?? Bilirubin, Direct 0.20 0.00 - 0.40 mg/dL      ??  ??  ??  Imaging:  None today. Most recent CT abdomen/ pelvis without contrast (11/27/2018) showed:   ??  FINDINGS:   ??  Evaluation of the solid organs and vasculature is limited in the absence of intravenous contrast.  ??  LINES AND TUBES: None.  ??  LOWER THORAX: Normal  ??  HEPATOBILIARY:No focal hepatic lesions. The gallbladder is present and is unremarkable. No biliary dilatation. ??  SPLEEN: Unremarkable.  PANCREAS: Unremarkable.  ??  ADRENALS: Unremarkable.  KIDNEYS/URETERS: Unchanged polycystic disease of the kidneys, with innumerable simple cysts.  ??  BLADDER: Unremarkable.  PELVIC/REPRODUCTIVE ORGANS: Unremarkable.  ??  GI TRACT: No dilated or thick walled loops of bowel. ??The appendix is normal. PERITONEUM/RETROPERITONEUM AND MESENTERY: Small volume peripancreatic free fluid layering along the left perirenal space  LYMPH NODES: No enlarged lymph nodes.   VESSELS: The aorta is normal in caliber. ??Scant atherosclerotic calcifications of the abdominal aorta. Normal in caliber.  ??  BONES AND SOFT TISSUES: Multilevel degenerative disease of the spine. ??No concerning soft tissue lesions. ??No acute fracture, dislocation, or listhesis. ??No aggressive lytic or blastic osseus lesions.  ??  ??  IMPRESSION:  Small volume peripancreatic free fluid may represent recent acute pancreatitis. Recommend clinical correlation.  ??  ++++++++++++++++++++  ADDENDUM (11/27/2018 9:39 AM):   ??  There is evidence of likely hemorrhage at the upper part of the left kidney which is likely secondary to cyst rupture/hemorrhage and hemorrhage is predominantly within the confines of the left kidney. There is evidence of asymmetrical prominent perinephric intermediate to high density fluid which could be secondary to likely reactive changes although there may be some small component of left-sided perinephric hematoma.  No evidence of acute pancreatitis.  ??  I have verified all student documentation or findings.?? I have personally performed or re-performed the physical exam and medical decision making.  ??  [facility] MD  ??      Electronically signed by Leona Carry, MD at 01/03/19 1510

## 2019-02-06 LAB — TACROLIMUS, TROUGH
Lab: 7
Lab: 8.2

## 2019-02-06 LAB — TROPONIN I: Troponin I.cardiac:MCnc:Pt:Ser/Plas:Qn:: <0.034

## 2019-02-06 LAB — MAGNESIUM: Magnesium:MCnc:Pt:Ser/Plas:Qn:: 1.7

## 2019-02-06 LAB — CBC
HEMATOCRIT: 30.4 % — ABNORMAL LOW (ref 41.0–53.0)
HEMOGLOBIN: 9.8 g/dL — ABNORMAL LOW (ref 13.5–17.5)
MEAN CORPUSCULAR HEMOGLOBIN CONC: 32.1 g/dL (ref 31.0–37.0)
MEAN CORPUSCULAR HEMOGLOBIN: 28.3 pg (ref 26.0–34.0)
MEAN CORPUSCULAR VOLUME: 88.2 fL (ref 80.0–100.0)
MEAN PLATELET VOLUME: 9.8 fL (ref 7.0–10.0)
PLATELET COUNT: 175 10*9/L (ref 150–440)
RED BLOOD CELL COUNT: 3.44 10*12/L — ABNORMAL LOW (ref 4.50–5.90)
RED CELL DISTRIBUTION WIDTH: 18.3 % — ABNORMAL HIGH (ref 12.0–15.0)
WBC ADJUSTED: 3.8 10*9/L — ABNORMAL LOW (ref 4.5–11.0)

## 2019-02-06 LAB — BASIC METABOLIC PANEL
ANION GAP: 3 mmol/L — ABNORMAL LOW (ref 7–15)
BLOOD UREA NITROGEN: 16 mg/dL (ref 7–21)
BUN / CREAT RATIO: 12
CALCIUM: 8.6 mg/dL (ref 8.5–10.2)
CO2: 26 mmol/L (ref 22.0–30.0)
EGFR CKD-EPI AA MALE: 73 mL/min/{1.73_m2} (ref >=60)
EGFR CKD-EPI NON-AA MALE: 63 mL/min/{1.73_m2} (ref >=60)
GLUCOSE RANDOM: 121 mg/dL (ref 70–179)
POTASSIUM: 5.2 mmol/L — ABNORMAL HIGH (ref 3.5–5.0)
SODIUM: 135 mmol/L (ref 135–145)

## 2019-02-06 LAB — MEAN CORPUSCULAR HEMOGLOBIN CONC: Erythrocyte mean corpuscular hemoglobin concentration:MCnc:Pt:RBC:Qn:Automated count: 32.1

## 2019-02-06 LAB — PHOSPHORUS: Phosphate:MCnc:Pt:Ser/Plas:Qn:: 2.9

## 2019-02-06 LAB — EGFR CKD-EPI AA MALE: Lab: 73

## 2019-02-06 NOTE — Unmapped (Signed)
Acute Pain Service Follow-Up Note- Epidural      Assessment and Plan    44 y.o. yo s/p bilateral nephrectomy by Dr. Norma Fredrickson on 02/05/19  POD# 1 with an epidural catheter for postoperative pain.    Interim:     Patient was rolled out of OR to PACU with a concern for blood transfusion reaction. He also received a second dose of antibiotic (Ancef) shortly before extubation. Epidural rate was at 4 ml/her due to hemodynamics. Patient was in pain, but stable. Blood transfusion (RBC) was stopped and sent to blood bank for antibody screen. He was given Benadryl for generalized hives. Patient reported pain in his left neck (former site of .IJ and blood transfusion) and abdomen. Upon examination he had no levels. 2ml of Lidocaine 2% was administered with good response. T4-T12 bilateral on ice/needle check.     Due to the pain in his neck and generalized hives, the decision was made to split the brew.   Overnight, patient used 3.8 mg of HM PCA. No HM PRN use over night. He states he slept through most of the night. Pain scores 5/8/8/5. He also received Fentanyl 25 mcg X2. He remains in PACU due to bed shortage.     Good levels bilaterally with blunt needle check T4-T10.   This morning patient complains of chest pain and shoulder pain (tender to touch over shoulder joint). CXRay and EKG pending. Hives resolved.     Plan:   - Continue HM PCA 0.2mg  1q48min   - Continue Bupi 0.15% @6ml /hr     Adjuncts:   - HM 0.5mg  q4hrs PRN   - Oxycodone 5-10 mg q4hrs PRN   - Tylenol 1g q8hrs Sch   - Gabapentin 100 mg HS (it can be increased if needed)    If applicable, naloxone available for sedation or respiratory depression.  If necessary, low dose naloxone infusion available for opiate related pruritis or nausea.    No anesthesia/epidural need for foley catheter.  Please remove as soon as clinically appropriate.    Contact APS before starting any anticoagulant other than [place] heparin. Contact the Acute Pain Service first at [phone] with questions or concerns 24 hours a day.  If unable to reach, please contact on call Anesthesiology resident at [phone].    CPT daily management of epidural 47829  Diagnosis: abdominal pain      History    Patient is a 44 y.o. yo  s/p bilateral nephrectomy by Dr. Norma Fredrickson on 02/05/19     Daily Hospital Course    Epidural Catheter  Level: T9/10  LOR:  6cm  Catheter at skin: 11cm  Catheter Start Date: 02/05/19  Postop/Catheter Day: 1    Analgesia Evaluation:  Pain at rest: 5/10  Pain with activity: 8/10    Infusion: Bupivacaine 0.15%  Continuous Infusion Rate: 64ml/hour  PCEA Settings: 0ml every 30  minutes    Additional opioid and non-opioid analgesics: See above     Complications:  Pruritis: No  Urinary Retention: No  Nausea/Vomiting: No  Sedation: No  Respiratory Depression: No    Physical Exam    Vitals:    02/06/19 0753   BP:    Pulse: 75   Resp: 27   Temp: 37.3 ??C   SpO2: 99%   BP 143/67    GEN: Patient sitting up in bed, in moderate distress  CHEST: Unlabored breathing, supplemental oxygen is not present  SKIN: epidural dressing clean, dry, intact; no erythema at insertion site, no fluctuance  or tenderness noted  GENITOURINARY: foley catheter is present  NEUROLOGIC: patient moves all extremities, 5/5 strength in lower extremities, alert and oriented to person, place and time; other deficits noted: None

## 2019-02-06 NOTE — Unmapped (Signed)
Tacrolimus Therapeutic Monitoring Pharmacy Note    Ethan West is a 44 y.o. male continuing tacrolimus.     Indication: Kidney transplant     Date of Transplant: 06/25/2018      Prior Dosing Information: Current regimen 2 mg AM / 1 mg PM      Goals:  Therapeutic Drug Levels  Tacrolimus trough goal: Goal of 12 hour Prograf trough: 7-9 per 01/10/19 Note    Additional Clinical Monitoring/Outcomes  ?? Monitor renal function (SCr and urine output) and liver function (LFTs)  ?? Monitor for signs/symptoms of adverse events (e.g., hyperglycemia, hyperkalemia, hypomagnesemia, hypertension, headache, tremor)    Results:   Tacrolimus level: Not applicable    Pharmacokinetic Considerations and Significant Drug Interactions:  ? Concurrent hepatotoxic medications: None identified  ? Concurrent CYP3A4 substrates/inhibitors: None identified  ? Concurrent nephrotoxic medications: None identified    Assessment/Plan:  Recommendedation(s)  ? Continue current regimen of 2 mg AM / 1 mg PM    Follow-up  ? Next level to be determined by primary team.   ? A pharmacist will continue to monitor and recommend levels as appropriate    Please page service pharmacist with questions/clarifications.    Hollie Beach, PharmD

## 2019-02-06 NOTE — Unmapped (Signed)
Brief Operative Note  (CSN: [id number])      Date of Surgery: 02/05/2019    Pre-op Diagnosis: ESRD    Post-op Diagnosis: same    Procedure(s):  Nephrectomy, W/Part Ureterectomy, Any Open Approach W/Rib Resection; Complicated Previous Surg Same Kidney: 91478 (CPT??)  Note: Revisions to procedures should be made in chart - see Procedures activity.    Performing Service: Transplant  Surgeon(s) and Role:     * Leona Carry, MD - Primary     [facility] Joesphine Bare, MD - Assisting     * Imagene Riches, MD - Resident - Assisting    Assistant: None    Findings: Bilateral native nephrectomies. Fascia closed. Skin stapled. JP drain on resection bed on left side.    Anesthesia: General    Estimated Blood Loss: 1750 mL    Complications: None    Specimens:   ID Type Source Tests Collected by Time Destination   1 :  Tissue Kidney, Left SURGICAL PATHOLOGY EXAM Leona Carry, MD 02/05/2019 1339    2 :  Tissue Kidney, Right SURGICAL PATHOLOGY EXAM Leona Carry, MD 02/05/2019 1418        Implants: * No implants in log *    Surgeon Notes: Dr. Norma Fredrickson was present and scrubbed for the entire procedure    Imagene Riches   Date: 02/05/2019  Time: 4:09 PM

## 2019-02-06 NOTE — Unmapped (Signed)
Social Work  Psychosocial Assessment    Patient Name: Ethan West   Medical Record Number: [id number]   Date of Birth: 03/06/1975  Sex: Male     Referral  Referred by: Care Manager  Reason for Referral: Complex Discharge Planning    Extended Emergency Contact Information  Primary Emergency Contact: [facility]  Address: [address]           [place], [place] [address] [place] Phone: [phone]  Relation: Spouse  Secondary Emergency Contact: Henrietta [place], [place]  Mobile Phone: [phone]  Relation: Relative  Interpreter needed? No    Legal Next of Kin / Guardian / POA / Advance Directives      Advance Directive (Medical Treatment)  Does patient have an advance directive covering medical treatment?: Patient does not have advance directive covering medical treatment.              Discharge Planning  Discharge Planning Information:   Type of Residence   Mailing Address:  [address]  [place] [address]    Medical Information   Past Medical History:   Diagnosis Date   ??? ADPKD (autosomal dominant polycystic kidney disease)    ??? Hypertension    ??? Kidney stone    ??? Polycystic kidney disease        Past Surgical History:   Procedure Laterality Date   ??? APPENDECTOMY     ??? EXPLORATORY LAPAROTOMY     ??? NEPHRECTOMY TRANSPLANTED ORGAN     ??? PR AV ANAST,UP ARM BASILIC VEIN TRANSPOSIT Left 05/06/2438    Procedure: ARTERIOVENOUS ANASTOMOSIS, OPEN; BY UPPER ARM BASILIC VEIN TRANSPOSITION;  Surgeon: Leona Carry, MD;  Location: MAIN OR [facility];  Service: Transplant   ??? PR CREAT AV FISTULA,AUTOGENOUS GRAFT Left 12/05/2017    Procedure: CREATE AV FISTULA (SEPART PROC); AUTOG GFT, UPPER EXTREMITY;  Surgeon: Leona Carry, MD;  Location: MAIN OR [facility];  Service: Transplant   ??? PR TRANSPLANT,PREP CADAVER RENAL GRAFT N/A 06/25/2018 Procedure: [facility] STD PREP CAD DONR RENAL ALLOGFT PRIOR TO TRNSPLNT, INCL DISSEC/REM PERINEPH FAT, DIAPH/RTPER ATTAC;  Surgeon: Leona Carry, MD;  Location: MAIN OR [facility];  Service: Transplant   ??? PR TRANSPLANTATION OF KIDNEY N/A 06/25/2018    Procedure: Priority RENAL ALLOTRANSPLANTATION, IMPLANTATION OF GRAFT; WITHOUT RECIPIENT NEPHRECTOMY;  Surgeon: Leona Carry, MD;  Location: MAIN OR [facility];  Service: Transplant       Family History   Problem Relation Age of Onset   ??? Hypertension Father    ??? Kidney disease Father    ??? Polycystic kidney disease Father    ??? No Known Problems Mother    ??? Prostate cancer Neg Hx    ??? GU problems Neg Hx        Financial Information   Primary Insurance: Payor: MEDCOST / Plan: MEDCOST MBS / Product Type: *No Product type* /    Secondary Insurance: None   Prescription Coverage: Nurse, learning disability (listed above)   Preferred Pharmacy: [id number] [place], [place] - [address]  [facility]  [facility]  [facility] - [place] - [address]    Barriers to taking medication: No    Transition Home   Transportation at time of discharge: Family/Friend's Private Vehicle    Anticipated changes related to Illness: none   Services in place prior to admission:  N/A   Services anticipated for DC: N/A   Hemodialysis Prior to Admission: No    Readmission  Risk of Unplanned Readmission Score: UNPLANNED READMISSION SCORE: 17%  Readmitted Within the Last 30 Days?   Readmission Factors include: current reason for admission unrelated to previous admission    Social Determinants of Health  Social Determinants of Health were addressed in provider documentation.  Please refer to patient history.    Social History  Support Systems/Concerns: Child psychotherapist Service: No Marine scientist and Psychiatric History  Psychosocial Stressors: Denies Psychological Issues/Information: No issues              Chemical Dependency: None              Outpatient Providers: Specialist   Name / Contact #: : [facility]  Legal: No legal issues      Ability to [employer]: No issues accessing community services      **    Spoke with the patient by phone. He shared being in pain. Denied any recent or current mental health concerns. No HH services prior to admission.    The patient denied questions/concerns. No discharge needs identified at this time.    SW will continue to follow for safe discharge, support, and encouragement.    Thomasene Mohair, LCSW, CCTSW  Transplant Social Worker/Case Manager  [facility]

## 2019-02-06 NOTE — Unmapped (Addendum)
Date of Surgery: 02/05/2019  ??  Pre-op Diagnosis: abdominal pain, polycystic kidney disease, s/p kidney transplant  ??  Post-op Diagnosis: same  ??  Procedure(s):  Nephrectomy, W/Part Ureterectomy, Any Open Approach W/Rib Resection; Complicated Previous Surg Same Kidney: 16109 (CPT??)  Note: Revisions to procedures should be made in chart - see Procedures activity.  ??  Performing Service: Transplant  Surgeon(s) and Role:     * Leona Carry, MD - Primary     * [facility] Joesphine Bare, MD - Co Surgeon - please add appropriate modifier and no quaified resident available. Spcifically Dr Matilde Haymaker helped perform the dissection of both the left and right nephrectomy.       * Imagene Riches, MD - Resident - Assisting  ??    ??  Findings: Bilateral native nephrectomies. Kidneys . 20cm in length. Left one stuck and had several cysts adherent and filed with old blood.  Fascia closed. Skin stapled. JP drain on resection bed on left side.Some venous oozing around left adrenal bed.  ??  Anesthesia: General  ??  Estimated Blood Loss: 1500 mL  ??  Complications: None  ??  Specimens:   ID Type Source Tests Collected by Time Destination   1 :  Tissue Kidney, Left SURGICAL PATHOLOGY EXAM Leona Carry, MD 02/05/2019 1339 ??   2 :  Tissue Kidney, Right SURGICAL PATHOLOGY EXAM Leona Carry, MD 02/05/2019 1418 ??   ??  ??  Implants: * No implants in log *    Operative details:  Timeout and epidural placed.  General anesthesia and prepped and draped abdomen.  Second timeout.  Incision midline. Some adhesions lower midline noted just below umbilicus , site of prior surgery, possibly an appendectomy.    Left colon mobilized medially and left kidney identified and filled with cysts, many stuck and blood filled, evidence of prior cyst rupture as well on left.   Initially freed the lower pole and hilum and some lateral attachments. Ureter and arteries and veins divided with 2-0 ties.  Some hilar bleeding and posterior to hilum. Continued to mobilize upper pole and ligasure used around Adrenal and clips on vein. Ligasure and blunt dissection to free remaninig upper pole.   Some oozing in left nephrectomy bed but no major bleed so packed and attention turned to right kidney.    Again right colon medialized with electrocautery and kidney freed. No prior bleeding on this side and cleaner dissection.  Again all structures tied off separately with 2-0 ties.  Adrenal left intact on right side and kidney removed.   Surgicel placed in hilar stumpand adrenal but no significant bleed on the right side.    ON left we continued to have ooze from adrenal and surrounding/posterior lateral area. No renal hilar or splenic bleed.  Aggressive argon beam coagulation on this tissue. Then packed with Arista and surgicel. This improved and FFP was given.    A JP placed in this location and after observing for several minutes with minimal blood loss, we clossed the fascia with #1 running prolene. Prior, a 3-0 vicryl to repair a small serosal tear in small bowel near anterior abd wall performed.    Skin closed with staples.   All counts correct.     ??  Surgeon Notes: Dr. Norma Fredrickson was present and scrubbed for the entire procedure

## 2019-02-06 NOTE — Unmapped (Signed)
Operative Note  (CSN: [id number])      Date of Surgery: 02/05/2019    Pre-op Diagnosis: ESRD    Post-op Diagnosis: same       [Note: Revisions to procedures should be made in chart - see Procedures tab.]    Bilateral - Nephrectomy, W/Part Ureterectomy, Any Open Approach W/Rib Resection; Complicated Previous Surg Same Kidney    Performing Service: Transplant  Surgeon(s) and Role:     * Leona Carry, MD - Primary     * [facility] Joesphine Bare, MD - CoSurgeon     * Imagene Riches, MD - Resident - Assisting    Assistant: None    Anesthesia: General    Estimated Blood Loss: 1750 mL    Complications: None    Specimens:   ID Type Source Tests Collected by Time Destination   1 :  Tissue Kidney, Left SURGICAL PATHOLOGY EXAM Leona Carry, MD 02/05/2019 1339    2 :  Tissue Kidney, Right SURGICAL PATHOLOGY EXAM Leona Carry, MD 02/05/2019 1418        Findings: multiple renal cysts    Procedure: I was called to the OR after Dr Norma Fredrickson had made incision and dissected free part of the left kidney, taking the artery and vein.  We continued dissecting free the left kidney around the adrenal and below the spleen taking the ureter below the iliac vessels. We then removed the kidney completley and verified hemostasis we placed arista and surgicel in the kidney bed and packed it. We then mobilized the bowel and accessed the right side we went through tolds line and mobilized the colon, we saw the right kidney and got access to the inferior IVC we localized the renal vein and ligated it with 0 silk ties did the same with the 2 renal arteries, we then mobilized the kidney and dissected free with the ligasure, we verified hemostasis and placed a drain in the left nephrectomyt bed. This was an extremely complex case with very large kidney in a patient with multipkle previous surgeries. Dr Norma Fredrickson closed the patient Surgeon Notes: I was present and scrubbed for the key portions of the procedure and A qualified resident physician was not available    Doyce Loose   Date: 02/06/2019  Time: 11:36 AM

## 2019-02-06 NOTE — Unmapped (Signed)
SRF TEACHING ROUNDS or CASE REVIEW    An interdisciplinary care conference was held today and included the following team members: Heidi Dach, MD Transplant Surgeon, Aleen Campi, MD Transplant Surgeon, Katherine Roan, MD Transplant Surgeon , Jacqulyn Ducking, MD Transplant Surgeon , Trilby Leaver, MD Transplant Surgery Fellow, SRF Surgery Resident , Lisbeth Ply, MD Transplant Nephrologist, Jackelyn Poling, MD Nephrologist, Kriste Basque, MD Transplant Hepatology Fellow, Tawni Carnes, MD Transplant Hepatology Fellow, Drake Leach, [place] Transplant Physician Assistant, Cranston Neighbor, RN Transplant Nurse Coordinator, Salem Senate, RN, MSN Transplant Nurse Coordinator, Newton Pigg, RN, MSN  Transplant Nurse Coordinator, Carole Binning, LCSWA Transplant Case Manager, Thomasene Mohair, LCSW Transplant Case Manager, Toy Care, PharmD Transplant Pharmacist, Valentino Nose, RD Transplant Dietician and Staff Nurse       Per team, nephrectomy on previous date. Monitor for progress, recommends to slowly advance diet.    Discharge Plan: TBD    Thomasene Mohair, LCSW, CCTSW  Transplant Case Manager  [facility] for Transplant Care

## 2019-02-06 NOTE — Unmapped (Signed)
TRANSFUSION REACTION INTERPRETATION    Interpretation:  The symptoms of urticaria and resolution with Benadryl are most consistent with a mild allergic reaction.    Recommendations:  1. Transfuse only when necessary. No change to transfusion protocols are needed at this time.  2. Premedication with antihistamines like Benadryl before future transfusions, may diminish the severity of future allergic events  3. Call the [facility] [phone] with any additional questions or concerns    Transfusion Medicine Attending Attestation:  I reviewed the patient's chart and transfusion medicine laboratory evaluation.  I agree with the above findings, which are consistent with an allergic transfusion reaction, and with the resident's recommendations.      Silas Sacramento, MD MS  [facility] Transfusion Medicine Service    Clinical History:  The patient is a 44 y.o. 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  A unit of leukoreduced pRBC was ordered for post-op to maintain HGB.    Transfusion Event:  On 02/05/19 at 1555 hours , the patient was transfused with one unit of pRBC. After approximately <59mL at 1558 hrs, the patient had developed urticaria. The transfusion was stopped at 1558 hrs.    Vitals Pre-transfusion:  Vitals Post-transfusion:    Temperature: 36.6 C Temperature: 36.8 C   Pulse: 86 bpm Pulse: 83 bpm   Blood Pressure: 129/80 mmHg Blood Pressure: 106/60 mmHg   Respirations:  rpm Respirations: 29 rpm   Note: Vitals were not provided by the consulting service. Vitals above are from review of vital signs in patients chart at 0922 and 1632 on 02/05/19 respectively. Vital trends were reviewed in the anaesthesia note and showed no notable changes in changes in vitals.     The patient did not have fever, chills, SOB, or back pain.    Work-Up: Clerical check confirmed the patient's identity, which matched the labels of the unit without any discrepancies. The serum of the pre- and post- transfusion samples are clear yellow in color, indicating a lack of hemolysis. The direct antiglobulin test is negative. The patient's ABO/Rh type is re-checked, which is confirmed as B+ on front and back typing. There is no evidence of incompatibility between the donor and the recipient.      Post transfusion labs:   Lab Results   Component Value Date    WBC 3.8 (L) 02/06/2019    HGB 9.8 (L) 02/06/2019    HCT 30.4 (L) 02/06/2019    PLT 175 02/06/2019       Quentin Ore MD  Pathology Resident Year 4   Pager: [phone]

## 2019-02-07 LAB — BASIC METABOLIC PANEL
ANION GAP: 4 mmol/L — ABNORMAL LOW (ref 7–15)
BLOOD UREA NITROGEN: 17 mg/dL (ref 7–21)
CALCIUM: 8.5 mg/dL (ref 8.5–10.2)
CHLORIDE: 105 mmol/L (ref 98–107)
CO2: 25 mmol/L (ref 22.0–30.0)
CREATININE: 1.32 mg/dL — ABNORMAL HIGH (ref 0.70–1.30)
EGFR CKD-EPI AA MALE: 76 mL/min/{1.73_m2} (ref >=60)
EGFR CKD-EPI NON-AA MALE: 66 mL/min/{1.73_m2} (ref >=60)
POTASSIUM: 5 mmol/L (ref 3.5–5.0)
SODIUM: 134 mmol/L — ABNORMAL LOW (ref 135–145)

## 2019-02-07 LAB — BUN / CREAT RATIO: Urea nitrogen/Creatinine:MRto:Pt:Ser/Plas:Qn:: 13

## 2019-02-07 LAB — CBC
HEMATOCRIT: 27.4 % — ABNORMAL LOW (ref 41.0–53.0)
HEMOGLOBIN: 8.6 g/dL — ABNORMAL LOW (ref 13.5–17.5)
MEAN CORPUSCULAR HEMOGLOBIN CONC: 31.3 g/dL (ref 31.0–37.0)
MEAN CORPUSCULAR HEMOGLOBIN: 27.8 pg (ref 26.0–34.0)
MEAN CORPUSCULAR VOLUME: 88.6 fL (ref 80.0–100.0)
MEAN PLATELET VOLUME: 9.1 fL (ref 7.0–10.0)
PLATELET COUNT: 121 10*9/L — ABNORMAL LOW (ref 150–440)
RED BLOOD CELL COUNT: 3.1 10*12/L — ABNORMAL LOW (ref 4.50–5.90)
WBC ADJUSTED: 2.8 10*9/L — ABNORMAL LOW (ref 4.5–11.0)

## 2019-02-07 LAB — IRON PANEL: TOTAL IRON BINDING CAPACITY (CALC): 147 mg/dL — ABNORMAL LOW (ref 252.0–479.0)

## 2019-02-07 LAB — HEMATOCRIT: Hematocrit:VFr:Pt:Bld:Qn:: 27.4 — ABNORMAL LOW

## 2019-02-07 LAB — TACROLIMUS LEVEL, TROUGH: TACROLIMUS, TROUGH: 6.2 ng/mL (ref 5.0–15.0)

## 2019-02-07 LAB — PHOSPHORUS: Phosphate:MCnc:Pt:Ser/Plas:Qn:: 2.6 — ABNORMAL LOW

## 2019-02-07 LAB — TACROLIMUS, TROUGH: Lab: 6.2

## 2019-02-07 LAB — MAGNESIUM: Magnesium:MCnc:Pt:Ser/Plas:Qn:: 1.6

## 2019-02-07 LAB — TRANSFERRIN: Transferrin:MCnc:Pt:Ser/Plas:Qn:: 116.7 — ABNORMAL LOW

## 2019-02-07 MED ORDER — ACETAMINOPHEN 500 MG TABLET
ORAL_TABLET | Freq: Four times a day (QID) | ORAL | 0 refills | 7 days | Status: CP | PRN
Start: 2019-02-07 — End: ?
  Filled 2019-02-10: qty 50, 6d supply, fill #0

## 2019-02-07 MED ORDER — OXYCODONE 5 MG TABLET
ORAL_TABLET | Freq: Four times a day (QID) | ORAL | 0 refills | 5.00000 days | Status: CP | PRN
Start: 2019-02-07 — End: ?
  Filled 2019-02-10: qty 40, 7d supply, fill #0

## 2019-02-07 MED ORDER — DOCUSATE SODIUM 100 MG CAPSULE
Freq: Two times a day (BID) | ORAL | 0 refills | 15 days | Status: CP | PRN
Start: 2019-02-07 — End: ?

## 2019-02-07 MED ORDER — POLYETHYLENE GLYCOL 3350 17 GRAM ORAL POWDER PACKET
Freq: Every day | ORAL | 0 refills | 14 days | Status: CP | PRN
Start: 2019-02-07 — End: ?
  Filled 2019-02-10: qty 14, 14d supply, fill #0

## 2019-02-07 NOTE — Unmapped (Signed)
TRANSPLANT SURGERY PROGRESS NOTE     Hospital Service: SRF  Attending: [place]     Assessment:    Ethan West is a 44 y.o. 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     Interval events:   - CXR, EKG and troponin for chest pain, likely referred diaphragmatic pain  - APS following     Plan:     Neuro:  - multimodal pain control with epidural and dPCA  - APS following    Pulmonary:  - encourage pulmonary toilet    FEN/GI:  - NPO sips with meds  - NS at 100 ml/hr    Genitourinary:  - Foley catheter    Heme/ID:   - continue home immunosuppression    Disposition: ISCU       Subjective     Patient reports pain radiating from his rib cage to the back of his neck, worse with deep breaths, relieved with pain medication.      Objective     Vitals   Vitals:    02/06/19 1730   BP:    Pulse: 82   Resp: 17   Temp: 37 ??C   SpO2: 97%       Intake/Output last 24 hours:    Intake/Output Summary (Last 24 hours) at 02/06/2019 1824  Last data filed at 02/06/2019 1704  Gross per 24 hour   Intake 1090 ml   Output 5255 ml   Net -4165 ml        Physical Exam:  General: lying in bed, no acute distress  Neuro: alert and oriented  Cardiovascular: regular rate  Pulm: non-labored respirations on room air  Abdomen: soft, appropriately tender  -----------------------------------------------------     All labs and imaging collected over the past 24 hours reviewed.    Despina Pole, M.D.  General Surgery - PGY1  Pager: [phone]

## 2019-02-07 NOTE — Unmapped (Signed)
TRANSPLANT SURGERY PROGRESS NOTE     Hospital Service: SRF  Attending: [place]     Assessment:    Ethan West is a 44 y.o. 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     Interval events:   - shortness of breath and pain this morning, APS following and making adjustments     Plan:     Neuro:  - multimodal pain control with epidural and dPCA  - APS following    Pulmonary:  - encourage pulmonary toilet    FEN/GI:  - CLD    Genitourinary:  - Foley catheter    Heme/ID:   - continue home immunosuppression    Disposition: transfer to floor     Subjective     Patient in pain this morning after APS manipulation of epidural.     Objective     Vitals   Vitals:    02/07/19 0729   BP: 114/59   Pulse: 91   Resp: 22   Temp: 37.4 ??C   SpO2: 99%       Intake/Output last 24 hours:    Intake/Output Summary (Last 24 hours) at 02/07/2019 1226  Last data filed at 02/07/2019 0800  Gross per 24 hour   Intake 2278.43 ml   Output 1382 ml   Net 896.43 ml        Physical Exam:  General: lying in bed, no acute distress  Neuro: alert and oriented  Cardiovascular: regular rate  Pulm: non-labored respirations on room air  Abdomen: soft, appropriately tender  -----------------------------------------------------     All labs and imaging collected over the past 24 hours reviewed.    Despina Pole, M.D.  General Surgery - PGY1  Pager: [phone]

## 2019-02-07 NOTE — Unmapped (Signed)
Nephrology Consult Note    Requesting Attending Physician:  Lilyan Punt [facility]*  Service Requesting Consult:  Surg Transplant [facility])    ASSESSMENT:    Ethan West is a 44 y.o. 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 Renal function holding stable   ?? Risk of ATN since prolonged intense surgery with hemodynamic fluctuations   ?? Continue to follow labs closely / avoid nephrotoxins / hypotension/ contrast study   ?? [place]ro lvls fluctuating / wait for trough tomorrow   ?? Repeat iron panel check> most likely will need IV supplementation   ?? Poor po intake due to pain and distress / continue IVF support           Ethan West  Division of Nephrology and Hypertension  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 Patient has been having significant issues with recurrent cyst rupture pain hematuria anemia and symptomatic trouble with large kidneys due to sheer size causing dyspnea, early satiety and bloating. He underwent the nephrectomy surgery on 02/06/19 and currently recuperating but with significant distress from the post surgical pain. Transplant nephrology consulted for management of the recent transplant.     Allergies:  Allergies   Allergen Reactions   ??? Benazepril Rash   ??? Clarithromycin Rash   ??? Penicillins Rash     Tolerates cefepime and cefazolin.       Medications:   Prior to Admission medications    Medication Sig Start Date End Date Taking? Authorizing Provider   albuterol HFA 90 mcg/actuation inhaler 2 puffs every 4-6 hours with spacer  Patient taking differently: Inhale 2 puffs every four (4) hours as needed for wheezing or shortness of breath. with spacer 12/02/18   Pankaj Jawa, MD   amLODIPine (NORVASC) 10 MG tablet Take 1 tablet (10 mg total) by mouth daily. 07/05/18 07/05/19  Trevor Iha, DO   aspirin (ECOTRIN) 81 MG tablet Take 1 tablet (81 mg total) by mouth daily. 06/27/18 06/27/19  Drake Leach, PA   carvediloL (COREG) 6.25 MG tablet Take 1 tablet (6.25 mg total) by mouth Two (2) times a day. 07/16/18 07/16/19  Leona Carry, MD   cholecalciferol, vitamin D3, 50 mcg (2,000 unit) tablet Take 1 tablet (2,000 Units total) by mouth daily. 07/16/18   Leona Carry, MD   escitalopram oxalate (LEXAPRO) 10 MG tablet Take 1 tablet (10 mg total) by mouth daily. 08/28/18 01/01/19  Leeroy Bock, MD   inhalational spacing device (AEROCHAMBER MV) Spcr 1 each by Miscellaneous route 6XD PRN. 12/02/18   Leeroy Bock, MD   mycophenolate (MYFORTIC) 180 MG EC tablet Take 3 tablets (540 mg total) by mouth Two (2) times a day. 07/22/18 07/22/19  Leona Carry, MD   sulfamethoxazole-trimethoprim (BACTRIM) 400-80 mg per tablet Take 1 tablet (80 mg  of trimethoprim total) by mouth Every Monday, Wednesday, and Friday. 06/28/18 12/25/18  Drake Leach, PA   tacrolimus (PROGRAF) 1 MG capsule Take 2 capsules in the morning and take 2 capsules at night 10/07/18   Leeroy Bock, MD       Current Facility-Administered Medications   Medication Dose Route Frequency Provider Last Rate Last Admin   ??? acetaminophen (TYLENOL) tablet 1,000 mg  1,000 mg Oral [facility] Despina Pole, MD   1,000 mg at 02/07/19 1406   ??? bupivacaine (PF) (MARCAINE) 0.15 % in sodium chloride (NS) 250 mL   Epidural Continuous Yetta Glassman, MD 8 mL/hr at 02/07/19 1458 Rate Change at 02/07/19 1458   ??? docusate sodium (COLACE) capsule 100 mg  100 mg Oral Daily Despina Pole, MD   100 mg at 02/07/19 0924   ??? escitalopram oxalate (LEXAPRO) tablet 10 mg  10 mg Oral Nightly Despina Pole, MD   10 mg at 02/06/19 2142   ??? gabapentin (NEURONTIN) capsule 100 mg  100 mg Oral Nightly Despina Pole, MD   100 mg at 02/06/19 2141   ??? heparin (porcine) injection 5,000 Units  5,000 Units Subcutaneous [facility] Despina Pole, MD   5,000 Units at 02/07/19 1406   ??? HYDROmorphone (DILAUDID) 50mg /33ml (1mg /ml) PCA CADD   Intravenous Continuous Yetta Glassman, MD   New Syringe/Cartridge at 02/07/19 1500   ??? HYDROmorphone (PF) (DILAUDID) injection 1 mg  1 mg Intravenous Once Yetta Glassman, MD       ??? HYDROmorphone (PF) (DILAUDID) injection 1 mg  1 mg Intravenous Q4H PRN Yetta Glassman, MD       ??? lactated Ringers infusion  10 mL/hr Intravenous Continuous Despina Pole, MD       ??? lidocaine (LIDODERM) 5 % patch 1 patch  1 patch Transdermal Daily Despina Pole, MD   Stopped at 02/07/19 0431   ??? mycophenolate (MYFORTIC) EC tablet 540 mg  540 mg Oral BID Despina Pole, MD   540 mg at 02/07/19 0924   ??? naloxone (NARCAN) injection 0.4 mg  0.4 mg Intravenous Q5 Min PRN Despina Pole, MD       ??? ondansetron (ZOFRAN) injection 4 mg  4 mg Intravenous Q8H PRN Despina Pole, MD       ??? oxyCODONE (ROXICODONE) immediate release tablet 5 mg  5 mg Oral Q4H PRN Despina Pole, MD   5 mg at 02/06/19 1050    Or   ??? oxyCODONE (ROXICODONE) immediate release tablet 10 mg  10 mg Oral Q4H PRN Despina Pole, MD   10 mg at 02/07/19 1406   ??? polyethylene glycol (MIRALAX) packet 17 g  17 g Oral Daily Despina Pole, MD   17 g at 02/07/19 0924   ??? sodium chloride (NS) 0.9 % infusion  50 mL/hr Intravenous Continuous Despina Pole, MD 50 mL/hr at 02/07/19 1352 50 mL/hr at 02/07/19 1352   ??? tacrolimus (PROGRAF) capsule 1 mg  1 mg Oral Nightly (2000) Belva Chimes [place], [place]       ??? tacrolimus (PROGRAF) capsule 2 mg  2 mg Oral Daily Despina Pole, MD   2 mg at 02/07/19 0636       Medical History:  Past Medical History:   Diagnosis Date   ??? ADPKD (autosomal dominant polycystic kidney disease) ??? Hypertension    ??? Kidney stone    ??? Polycystic kidney disease        Surgical History:  Past Surgical History:   Procedure Laterality Date   ???  APPENDECTOMY     ??? EXPLORATORY LAPAROTOMY     ??? NEPHRECTOMY TRANSPLANTED ORGAN     ??? PR AV ANAST,UP ARM BASILIC VEIN TRANSPOSIT Left 1/61/0960    Procedure: ARTERIOVENOUS ANASTOMOSIS, OPEN; BY UPPER ARM BASILIC VEIN TRANSPOSITION;  Surgeon: Leona Carry, MD;  Location: MAIN OR [facility];  Service: Transplant   ??? PR CREAT AV FISTULA,AUTOGENOUS GRAFT Left 12/05/2017    Procedure: CREATE AV FISTULA (SEPART PROC); AUTOG GFT, UPPER EXTREMITY;  Surgeon: Leona Carry, MD;  Location: MAIN OR [facility];  Service: Transplant   ??? PR REMV KIDNEY,COMPLICATED Bilateral 02/05/2019    Procedure: Nephrectomy, W/Part Ureterectomy, Any Open Approach W/Rib Resection; Complicated Previous Surg Same Kidney;  Surgeon: Leona Carry, MD;  Location: MAIN OR [facility];  Service: Transplant   ??? PR TRANSPLANT,PREP CADAVER RENAL GRAFT N/A 06/25/2018    Procedure: [facility] STD PREP CAD DONR RENAL ALLOGFT PRIOR TO TRNSPLNT, INCL DISSEC/REM PERINEPH FAT, DIAPH/RTPER ATTAC;  Surgeon: Leona Carry, MD;  Location: MAIN OR [facility];  Service: Transplant   ??? PR TRANSPLANTATION OF KIDNEY N/A 06/25/2018    Procedure: Priority RENAL ALLOTRANSPLANTATION, IMPLANTATION OF GRAFT; WITHOUT RECIPIENT NEPHRECTOMY;  Surgeon: Leona Carry, MD;  Location: MAIN OR [facility];  Service: Transplant         Social History:  Tobacco use: denies  Alcohol use: denies  Drug use: denies      Family History:  Family History   Problem Relation Age of Onset   ??? Hypertension Father    ??? Kidney disease Father    ??? Polycystic kidney disease Father    ??? No Known Problems Mother    ??? Prostate cancer Neg Hx    ??? GU problems Neg Hx        Code Status:  Full Code    Review of Systems    10 point ros were done and negative unless specified in the HPI       Objective:     Vitals:    02/07/19 1113   BP: 111/57 Pulse: 92   Resp: 27   Temp:    SpO2: 96%      Gen: built for age , significant distress from post surgery pain   Eyes: no pallor no icterus   ENT: wearing mask   Neck: wearing mask   CVS: s1 s2 heard, rhythm normal, no murmur   Resp: air entry b/l no creps   Abd: soft , tenderness new incision clean wound   Ext: no edema, no muscle wasting   Cns:  Aaox3, no tremor,grossly intact   Skin:  No new rash, no lesions     Laboratory data, Imaging data, and MAR reviewed in EPIC      Time spent on counseling/coordination of care: 30 Minutes  Total time spent with patient: 15 Minutes

## 2019-02-07 NOTE — Unmapped (Signed)
SRF TEACHING ROUNDS    An interdisciplinary care conference was held today and included the following team members: Heidi Dach, MD Transplant Surgeon, SRF Surgery Resident , Nunzio Cobbs, MD Transplant Nephrology Fellow , Drake Leach, [place] Transplant Physician Assistant, Newton Pigg, RN, MSN  Transplant Nurse Coordinator, Justice Britain, LCSW Transplant Case Manager and Toy Care, PharmD Transplant Pharmacist.    Per team, patient short of breath and in acute pain in rib cage area. Creatinine is good. Per team, patient would benefit from sitting in chair.    Discharge Plan: No tentative discharge plans at this time.        Justice Britain, LCSW  Transplant Case Manager  [facility] for Transplant Care  Completed:02/06/2018

## 2019-02-07 NOTE — Unmapped (Signed)
Acute Pain Service Follow-Up Note- Epidural      Assessment and Plan    44 y.o. yo s/p bilateral nephrectomy by Dr. Norma Fredrickson on 02/05/19  POD# 2 with an epidural catheter for postoperative pain.    Interim:      Patient reports pain upon every breath. Upon evaluation of his epidural spine levels he had T3-T12 on the left and T4-T5 on the right side. He was repositioned to his right side, epidural catheter pulled back to mark 10 and a small bolus from the pump was given. Patient was not able to tolerate right lateral position and developed sharp pain in his right upper quadrant radiating to his right neck/shoulder. Pain was only relieved by additional 1 mg IV Hydromorphone dose.     Patient was brought down to PACU where the epidural catheter was pulled back to 9cm markings and bolused with Lidocaine 2% 2ml x2. Patient had T2-L1 levels bilaterally. Patient states he was able to take deep breaths and his pain significantly improved. The decision was made not to replace the epidural at this time.    Plan:   - Continue HM PCA 0.2mg  q42min   - Continue Bupi 0.15% @8ml /hr     Adjuncts:   - HM 1 mg q4hrs PRN   - Oxycodone 5-10 mg q4hrs PRN   - Tylenol 1g q8hrs Sch   - Gabapentin 100 mg HS (it can be increased if needed)  - Lidocaine patches     If applicable, naloxone available for sedation or respiratory depression.  If necessary, low dose naloxone infusion available for opiate related pruritis or nausea.    No anesthesia/epidural need for foley catheter.  Please remove as soon as clinically appropriate.    Contact APS before starting any anticoagulant other than [place] heparin.    Contact the Acute Pain Service first at [phone] with questions or concerns 24 hours a day.  If unable to reach, please contact on call Anesthesiology resident at [phone].    CPT daily management of epidural 47829  Diagnosis: abdominal pain      History    Patient is a 44 y.o. yo  s/p bilateral nephrectomy by Dr. Norma Fredrickson on 02/05/19 Daily Hospital Course    Epidural Catheter  Level: T9/10  LOR:  6cm  Catheter at skin: 9cm after readjustment   Catheter Start Date: 02/05/19  Postop/Catheter Day: 2    Analgesia Evaluation:  Pain at rest: 5/10  Pain with activity: 8/10    Infusion: Bupivacaine 0.15%  Continuous Infusion Rate: 56ml/hour  PCEA Settings: 0ml every 30  minutes    Additional opioid and non-opioid analgesics: See above     Complications:  Pruritis: No  Urinary Retention: No  Nausea/Vomiting: No  Sedation: No  Respiratory Depression: No    Physical Exam    Vitals:    02/07/19 1113   BP: 111/57   Pulse: 92   Resp: 27   Temp:    SpO2: 96%   BP 143/67    GEN: Patient sitting up in bed, in moderate distress  CHEST: Unlabored breathing, supplemental oxygen is not present  SKIN: epidural dressing clean, dry, intact; no erythema at insertion site, no fluctuance or tenderness noted  GENITOURINARY: foley catheter is present  NEUROLOGIC: patient moves all extremities, 5/5 strength in lower extremities, alert and oriented to person, place and time; other deficits noted: None

## 2019-02-07 NOTE — Unmapped (Signed)
Pt a/o x4. VSS, afebrile and saturating well on 2L [place]. Pain managed with prns, epidural and PCA. Pt NPO and voiding via foley catheter. JPx1 draining serosanguinous fluid. Fall precautions in place and call light within reach.   Problem: Adult Inpatient Plan of Care  Goal: Plan of Care Review  02/07/2019 0019 by Berna Bue, RN BSN  Outcome: Progressing  Flowsheets (Taken 02/07/2019 0019)  Progress: improving  Plan of Care Reviewed With: patient     Problem: Adult Inpatient Plan of Care  Goal: Patient-Specific Goal (Individualization)  02/07/2019 0019 by Berna Bue, RN BSN  Outcome: Progressing     Problem: Adult Inpatient Plan of Care  Goal: Absence of Hospital-Acquired Illness or Injury  02/07/2019 0019 by Berna Bue, RN BSN  Outcome: Progressing     Problem: Adult Inpatient Plan of Care  Goal: Optimal Comfort and Wellbeing  02/07/2019 0019 by Berna Bue, RN BSN  Outcome: Progressing     Problem: Adult Inpatient Plan of Care  Goal: Readiness for Transition of Care  02/07/2019 0019 by Berna Bue, RN BSN  Outcome: Progressing     Problem: Adult Inpatient Plan of Care  Goal: Rounds/Family Conference  02/07/2019 0019 by Berna Bue, RN BSN  Outcome: Progressing  Flowsheets (Taken 02/07/2019 0019)  Participants:  ??? nursing  ??? patient     Problem: Skin Injury Risk Increased  Goal: Skin Health and Integrity  02/07/2019 0019 by Berna Bue, RN BSN  Outcome: Progressing  02/07/2019 0019 by Berna Bue, RN BSN  Outcome: Progressing     Problem: Bleeding (Surgery Nonspecified)  Goal: Absence of Bleeding  02/07/2019 0019 by Berna Bue, RN BSN  Outcome: Progressing  02/07/2019 0019 by Berna Bue, RN BSN  Outcome: Progressing     Problem: Risk for Infection (Surgery Nonspecified)  Goal: Absence of Infection Signs/Symptoms  02/07/2019 0019 by Berna Bue, RN BSN  Outcome: Progressing  02/07/2019 0019 by Berna Bue, RN BSN  Outcome: Progressing     Problem: Pain Acute Goal: Optimal Pain Control  02/07/2019 0019 by Berna Bue, RN BSN  Outcome: Progressing  02/07/2019 0019 by Berna Bue, RN BSN  Outcome: Progressing     Problem: Fall Injury Risk  Goal: Absence of Fall and Fall-Related Injury  02/07/2019 0019 by Berna Bue, RN BSN  Outcome: Progressing  02/07/2019 0019 by Berna Bue, RN BSN  Outcome: Progressing     Problem: Venous Thromboembolism  Goal: VTE (Venous Thromboembolism) Symptom Resolution  02/07/2019 0019 by Berna Bue, RN BSN  Outcome: Progressing  02/07/2019 0019 by Berna Bue, RN BSN  Outcome: Progressing     Problem: Self-Care Deficit  Goal: Improved Ability to Complete Activities of Daily Living  02/07/2019 0019 by Berna Bue, RN BSN  Outcome: Progressing  02/07/2019 0019 by Berna Bue, RN BSN  Outcome: Progressing

## 2019-02-08 NOTE — Unmapped (Signed)
Shift summary 0700-1900: Pt A+Ox4, VSS. Pain not well managed this morning, brought down for epidural fix this AM. Upon arrival back to ISCU, pt pain well managed. Needed prn twice but was able to rest. UOP good, no BM. PO intake minimal, on CL diet. No OOB this shift. Pt to transfer to floor this shift, pt is stable.       Problem: Adult Inpatient Plan of Care  Goal: Plan of Care Review  Outcome: Progressing  Flowsheets (Taken 02/07/2019 1638)  Plan of Care Reviewed With: patient  Goal: Patient-Specific Goal (Individualization)  Outcome: Progressing  Flowsheets (Taken 02/07/2019 1638)  Patient-Specific Goals (Include Timeframe): Pt will have pain <7 for rest of hospitalization  Individualized Care Needs: monitoring, pain management  Goal: Absence of Hospital-Acquired Illness or Injury  Outcome: Progressing  Goal: Optimal Comfort and Wellbeing  Outcome: Progressing  Goal: Readiness for Transition of Care  Outcome: Progressing  Goal: Rounds/Family Conference  Outcome: Progressing     Problem: Skin Injury Risk Increased  Goal: Skin Health and Integrity  Outcome: Progressing     Problem: Bleeding (Surgery Nonspecified)  Goal: Absence of Bleeding  Outcome: Progressing     Problem: Risk for Infection (Surgery Nonspecified)  Goal: Absence of Infection Signs/Symptoms  Outcome: Progressing     Problem: Pain Acute  Goal: Optimal Pain Control  Outcome: Progressing     Problem: Fall Injury Risk  Goal: Absence of Fall and Fall-Related Injury  Outcome: Progressing     Problem: Venous Thromboembolism  Goal: VTE (Venous Thromboembolism) Symptom Resolution  Outcome: Progressing     Problem: Self-Care Deficit  Goal: Improved Ability to Complete Activities of Daily Living  Outcome: Progressing

## 2019-02-08 NOTE — Unmapped (Signed)
TRANSPLANT SURGERY PROGRESS NOTE     Hospital Service: [facility]  Attending: [place]     Assessment:    Ethan West is a 44 y.o. 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     Interval events:   - pain better controlled this morning  - started clear liquid diet this morning, may advance later as tolerated     Plan:     Neuro:  - multimodal pain control with epidural and dPCA  - APS following    Pulmonary:  - encourage pulmonary toilet    FEN/GI:  - CLD - may advance later as tolerated    Genitourinary:  - Foley catheter    Heme/ID:   - continue home immunosuppression    Disposition: transfer to floor     Subjective     Pain improved this morning. Tolerating CLD without nausea or vomiting.      Objective     Vitals   Vitals:    02/08/19 1215   BP: 138/76   Pulse: 89   Resp: 19   Temp: 36.4 ??C   SpO2: 98%       Intake/Output last 24 hours:    Intake/Output Summary (Last 24 hours) at 02/08/2019 1230  Last data filed at 02/08/2019 1216  Gross per 24 hour   Intake 1690 ml   Output 3120 ml   Net -1430 ml        Physical Exam:  General: lying in bed, no acute distress  Neuro: alert and oriented  Cardiovascular: mildly tachycardic  Pulm: non-labored respirations on 2L   Abdomen: soft, appropriately tender  -----------------------------------------------------     All labs and imaging collected over the past 24 hours reviewed.    Despina Pole, M.D.  General Surgery - PGY1  Pager: [phone]

## 2019-02-08 NOTE — Unmapped (Signed)
Acute Pain Service Follow-Up Note- Epidural      Assessment and Plan    44 y.o. yo s/p bilateral nephrectomy by Dr. Norma Fredrickson on 02/05/19  POD# 3 with an epidural catheter for postoperative pain.    Interim:      Pain is better controlled. Patient reports significant improvement. IS 1.5L. PCA 24hrs only 3 mg and 0.4mg  for 12 hrs. Tolerates PO well. Oxycodone 10 mg twice in 24 hrs. No HM IV use.     Plan:   - Continue HM PCA 0.2mg  q58min   - Continue Bupi 0.15% @8ml /hr     Adjuncts:   - HM 1 mg q4hrs PRN   - Oxycodone 5-10 mg q4hrs PRN   - Tylenol 1g q8hrs Sch   - Gabapentin 100 mg HS (it can be increased if needed)  - Lidocaine patches     If applicable, naloxone available for sedation or respiratory depression.  If necessary, low dose naloxone infusion available for opiate related pruritis or nausea.    No anesthesia/epidural need for foley catheter.  Please remove as soon as clinically appropriate.    Contact APS before starting any anticoagulant other than [place] heparin.    Contact the Acute Pain Service first at [phone] with questions or concerns 24 hours a day.  If unable to reach, please contact on call Anesthesiology resident at [phone].    CPT daily management of epidural 47829  Diagnosis: abdominal pain      History    Patient is a 44 y.o. yo  s/p bilateral nephrectomy by Dr. Norma Fredrickson on 02/05/19     Daily Hospital Course    Epidural Catheter  Level: T9/10  LOR:  6cm  Catheter at skin: 9cm after readjustment   Catheter Start Date: 02/05/19  Postop/Catheter Day: 3    Analgesia Evaluation:  Pain at rest: 5/10  Pain with activity: 8/10    Infusion: Bupivacaine 0.15%  Continuous Infusion Rate: 32ml/hour  PCEA Settings: 0ml every 30  minutes    Additional opioid and non-opioid analgesics: See above     Complications:  Pruritis: No  Urinary Retention: No  Nausea/Vomiting: No  Sedation: No  Respiratory Depression: No    Physical Exam    Vitals:    02/08/19 0804   BP: 140/77   Pulse: 89   Resp: 18   Temp: 36.5 ??C SpO2: 98%   BP 143/67    GEN: Patient sitting up in bed, in moderate distress  CHEST: Unlabored breathing, supplemental oxygen is not present  SKIN: epidural dressing clean, dry, intact; no erythema at insertion site, no fluctuance or tenderness noted  GENITOURINARY: foley catheter is present  NEUROLOGIC: patient moves all extremities, 5/5 strength in lower extremities, alert and oriented to person, place and time; other deficits noted: None

## 2019-02-08 NOTE — Unmapped (Signed)
Pt transferred out of ISCU. Reports pain that is inconsistent with surgical pain. Namely shoulders suprasternal neck and left flank. States that MD is aware. Foley catheter present with yellow urine output. JP with no measurable output. Epidural catheter and dilaudid pca in use.   Problem: Adult Inpatient Plan of Care  Goal: Plan of Care Review  Outcome: Progressing  Goal: Patient-Specific Goal (Individualization)  Outcome: Progressing  Goal: Absence of Hospital-Acquired Illness or Injury  Outcome: Progressing  Goal: Optimal Comfort and Wellbeing  Outcome: Progressing  Goal: Readiness for Transition of Care  Outcome: Progressing  Goal: Rounds/Family Conference  Outcome: Progressing     Problem: Skin Injury Risk Increased  Goal: Skin Health and Integrity  Outcome: Progressing     Problem: Bleeding (Surgery Nonspecified)  Goal: Absence of Bleeding  Outcome: Progressing     Problem: Risk for Infection (Surgery Nonspecified)  Goal: Absence of Infection Signs/Symptoms  Outcome: Progressing     Problem: Pain Acute  Goal: Optimal Pain Control  Outcome: Progressing     Problem: Fall Injury Risk  Goal: Absence of Fall and Fall-Related Injury  Outcome: Progressing     Problem: Venous Thromboembolism  Goal: VTE (Venous Thromboembolism) Symptom Resolution  Outcome: Progressing     Problem: Self-Care Deficit  Goal: Improved Ability to Complete Activities of Daily Living  Outcome: Progressing

## 2019-02-09 LAB — BASIC METABOLIC PANEL
ANION GAP: 5 mmol/L — ABNORMAL LOW (ref 7–15)
BLOOD UREA NITROGEN: 15 mg/dL (ref 7–21)
BUN / CREAT RATIO: 12
CALCIUM: 8.7 mg/dL (ref 8.5–10.2)
CO2: 26 mmol/L (ref 22.0–30.0)
CREATININE: 1.25 mg/dL (ref 0.70–1.30)
EGFR CKD-EPI AA MALE: 81 mL/min/{1.73_m2} (ref >=60)
GLUCOSE RANDOM: 104 mg/dL (ref 70–179)
POTASSIUM: 4.6 mmol/L (ref 3.5–5.0)
SODIUM: 132 mmol/L — ABNORMAL LOW (ref 135–145)

## 2019-02-09 LAB — CBC
HEMATOCRIT: 28.3 % — ABNORMAL LOW (ref 41.0–53.0)
HEMOGLOBIN: 8.9 g/dL — ABNORMAL LOW (ref 13.5–17.5)
MEAN CORPUSCULAR HEMOGLOBIN CONC: 31.6 g/dL (ref 31.0–37.0)
MEAN CORPUSCULAR HEMOGLOBIN: 28.1 pg (ref 26.0–34.0)
MEAN PLATELET VOLUME: 8.4 fL (ref 7.0–10.0)
PLATELET COUNT: 146 10*9/L — ABNORMAL LOW (ref 150–440)
RED BLOOD CELL COUNT: 3.18 10*12/L — ABNORMAL LOW (ref 4.50–5.90)
RED CELL DISTRIBUTION WIDTH: 17.2 % — ABNORMAL HIGH (ref 12.0–15.0)
WBC ADJUSTED: 1 10*9/L — ABNORMAL LOW (ref 4.5–11.0)

## 2019-02-09 LAB — RED BLOOD CELL COUNT: Lab: 3.18 — ABNORMAL LOW

## 2019-02-09 LAB — PHOSPHORUS: Phosphate:MCnc:Pt:Ser/Plas:Qn:: 2 — ABNORMAL LOW

## 2019-02-09 LAB — BLOOD UREA NITROGEN: Urea nitrogen:MCnc:Pt:Ser/Plas:Qn:: 15

## 2019-02-09 LAB — MAGNESIUM: Magnesium:MCnc:Pt:Ser/Plas:Qn:: 1.9

## 2019-02-09 NOTE — Unmapped (Signed)
Continues to bue pain. Bupivicaine epidural in place and running @ 55ml/hr. Dilaudid pca in use. No additional prn meds given. Foley catheter in place with light yellow output. Ambulated to bathroom only overnight. No BM. Reports passing gas. No falls.   Problem: Adult Inpatient Plan of Care  Goal: Plan of Care Review  Outcome: Progressing  Goal: Patient-Specific Goal (Individualization)  Outcome: Progressing  Goal: Absence of Hospital-Acquired Illness or Injury  Outcome: Progressing  Goal: Optimal Comfort and Wellbeing  Outcome: Progressing  Goal: Readiness for Transition of Care  Outcome: Progressing  Goal: Rounds/Family Conference  Outcome: Progressing     Problem: Skin Injury Risk Increased  Goal: Skin Health and Integrity  Outcome: Progressing     Problem: Bleeding (Surgery Nonspecified)  Goal: Absence of Bleeding  Outcome: Progressing     Problem: Risk for Infection (Surgery Nonspecified)  Goal: Absence of Infection Signs/Symptoms  Outcome: Progressing     Problem: Pain Acute  Goal: Optimal Pain Control  Outcome: Progressing     Problem: Fall Injury Risk  Goal: Absence of Fall and Fall-Related Injury  Outcome: Progressing     Problem: Venous Thromboembolism  Goal: VTE (Venous Thromboembolism) Symptom Resolution  Outcome: Progressing     Problem: Self-Care Deficit  Goal: Improved Ability to Complete Activities of Daily Living  Outcome: Progressing

## 2019-02-09 NOTE — Unmapped (Signed)
Acute Pain Service Follow-Up Note- Epidural      Assessment and Plan    44 y.o. yo s/p bilateral nephrectomy by Dr. Norma Fredrickson on 02/05/19  POD# 4 with an epidural catheter for postoperative pain.    Interim:      Pain slightly worse since yesterday, noted that epidural catheter has migrated out, epidural removed. Patient will continue to use HM PCA and oxycodone for pain control. PCA 24hrs use 3.4 mg, in addition patient used 1 mg IV HM bolus and 1 dose of 5 mg oxycodone yesterday. Patient reports some nausea yesterday, doing OK this morning, Cr improving from 1.32 to 1.25 today.      Plan:   - Epidural removed  - Continue HM PCA 0.2mg  q8min     Adjuncts:   - HM 1 mg q4hrs PRN  - Oxycodone 5-10 mg q4hrs PRN   - Tylenol 1g q8hrs Sch   - Gabapentin 200 mg HS (it can be increased if needed)  - Lidocaine patches     If applicable, naloxone available for sedation or respiratory depression.  If necessary, low dose naloxone infusion available for opiate related pruritis or nausea.    No anesthesia/epidural need for foley catheter.  Please remove as soon as clinically appropriate.    Contact APS before starting any anticoagulant other than [place] heparin.    Contact the Acute Pain Service first at [phone] with questions or concerns 24 hours a day.  If unable to reach, please contact on call Anesthesiology resident at [phone].    CPT daily management of epidural 65784  Diagnosis: abdominal pain      History    Patient is a 44 y.o. yo  s/p bilateral nephrectomy by Dr. Norma Fredrickson on 02/05/19     Daily Hospital Course    Epidural Catheter  Level: T9/10  LOR:  6cm  Catheter at skin: 11cm  Catheter Start Date: 02/05/19  Postop/Catheter Day: 4    Analgesia Evaluation:  Pain at rest: 4/10  Pain with activity: 8/10    Infusion: Bupivacaine 0.15%  Continuous Infusion Rate: 21ml/hour  PCEA Settings: 0ml every 30  minutes    Additional opioid and non-opioid analgesics: See above     Complications:  Pruritis: No  Urinary Retention: No Nausea/Vomiting: No  Sedation: No  Respiratory Depression: No    Physical Exam    Vitals:    02/09/19 0749   BP: 144/79   Pulse: 86   Resp: 18   Temp: 36.7 ??C (98.1 ??F)   SpO2: 99%   BP 143/67    GEN: Patient sitting up in bed, in no distress  CHEST: Unlabored breathing, supplemental oxygen is not present  SKIN: epidural dressing clean, moist due to sweat, epidural catheter tip subcutaneous 5 mm, otherwise catheter migrated out; no erythema at insertion site, no fluctuance or tenderness noted  GENITOURINARY: foley catheter is present  NEUROLOGIC: patient moves all extremities, 5/5 strength in lower extremities, alert and oriented to person, place and time; other deficits noted: None

## 2019-02-09 NOTE — Unmapped (Addendum)
Pt is A&O x4 and verbalizes understanding of POC.  OOB with SBA and FWW this shift, no falls.  Pt on a clears diet, diminished appetite, emesis x1 of bilious undigested food, improved with PRN Zofran; MD aware, if distention worsens and nausea persists, possibly will need NGT placed per MD.  Wounds C/D/I, SOTA and JP with ss output.  Pain managed with Dilaudid PCA, bupivicaine epidural, and PO and IV PRN meds for breakthrough pain; Acute Pain consulted today and came to give pt extra bolus, APS attributes referral pain and discomfort in right shoulder to post-op nerve pain, pt has been resting since bolus.  VSS, no BM or flatus this shift even after suppository administered per orders this morning, adequate UOP via Foley, no complaints or concerns.  Will continue to monitor.    Problem: Adult Inpatient Plan of Care  Goal: Plan of Care Review  Outcome: Ongoing - Unchanged  Goal: Patient-Specific Goal (Individualization)  Outcome: Ongoing - Unchanged  Goal: Absence of Hospital-Acquired Illness or Injury  Outcome: Ongoing - Unchanged  Goal: Optimal Comfort and Wellbeing  Outcome: Ongoing - Unchanged  Goal: Readiness for Transition of Care  Outcome: Ongoing - Unchanged  Goal: Rounds/Family Conference  Outcome: Ongoing - Unchanged     Problem: Skin Injury Risk Increased  Goal: Skin Health and Integrity  Outcome: Ongoing - Unchanged     Problem: Bleeding (Surgery Nonspecified)  Goal: Absence of Bleeding  Outcome: Ongoing - Unchanged     Problem: Risk for Infection (Surgery Nonspecified)  Goal: Absence of Infection Signs/Symptoms  Outcome: Ongoing - Unchanged     Problem: Pain Acute  Goal: Optimal Pain Control  Outcome: Ongoing - Unchanged     Problem: Fall Injury Risk  Goal: Absence of Fall and Fall-Related Injury  Outcome: Ongoing - Unchanged     Problem: Venous Thromboembolism  Goal: VTE (Venous Thromboembolism) Symptom Resolution  Outcome: Ongoing - Unchanged     Problem: Self-Care Deficit Goal: Improved Ability to Complete Activities of Daily Living  Outcome: Ongoing - Unchanged

## 2019-02-09 NOTE — Unmapped (Signed)
TRANSPLANT SURGERY PROGRESS NOTE     Hospital Service: [facility]  Attending: [place]     Assessment:    Ethan West is a 44 y.o. 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     Interval events:   - pain better controlled this morning  - regular diet, medlocked     Plan:     Neuro:  - multimodal pain control with epidural and dPCA  - APS following    Pulmonary:  - encourage pulmonary toilet    FEN/GI:  - regular diet  - medlocked  - bowel regimen    Genitourinary:  - Foley catheter    Heme/ID:   - continue home immunosuppression    Disposition: transfer to floor     Subjective     Pain improved this morning.       Objective     Vitals   Vitals:    02/09/19 0749   BP: 144/79   Pulse: 86   Resp: 18   Temp: 36.7 ??C   SpO2: 99%       Intake/Output last 24 hours:    Intake/Output Summary (Last 24 hours) at 02/09/2019 1610  Last data filed at 02/09/2019 0753  Gross per 24 hour   Intake 1791 ml   Output 3950 ml   Net -2159 ml        Physical Exam:  General: lying in bed, no acute distress  Neuro: alert and oriented  Cardiovascular: mildly tachycardic  Pulm: non-labored respirations on 2L [place]  Abdomen: soft, appropriately tender, JP serosanguinous  -----------------------------------------------------     All labs and imaging collected over the past 24 hours reviewed.    Despina Pole, M.D.  General Surgery - PGY1  Pager: [phone]

## 2019-02-09 NOTE — Unmapped (Signed)
Order was placed for a PIV by Venous Access Team (VAT).  Patient was assessed at bedside for placement of a PIV. Mask and protective eyewear were donned. Access was obtained. Blood return noted.  Dressing intact and device well secured.  Flushed with normal saline.  Pt advised to inform RN of any s/s of discomfort at the PIV site.  VENOUS ACCESS TEAM RECOMMENDATION     Indication:  Poor Venous Access    Two members of the Venous Access Team have assessed this patient including the use of ultrasound guidance for the placement of a PIV. Currently, the patient has insufficient vasculature to support the insertion or maintenance of PIV???s. At this time we suggest  a temporary line.    Workup / Procedure Time:  30 minutes    RN was notified.  Thank you,       Lorelle Formosa RN Venous Access Team [phone]          Workup / Procedure Time:  30 minutes      RN was notified.       Thank you,     Lorelle Formosa RN Venous Access Team

## 2019-02-10 LAB — MONOCYTES RELATIVE PERCENT: Monocytes/100 leukocytes:NFr:Pt:Bld:Qn:Automated count: 11.8

## 2019-02-10 LAB — CREATININE: Creatinine:MCnc:Pt:Ser/Plas:Qn:: 1.34 — ABNORMAL HIGH

## 2019-02-10 LAB — PHOSPHORUS: Phosphate:MCnc:Pt:Ser/Plas:Qn:: 2.5 — ABNORMAL LOW

## 2019-02-10 LAB — BASIC METABOLIC PANEL
ANION GAP: 5 mmol/L — ABNORMAL LOW (ref 7–15)
BLOOD UREA NITROGEN: 14 mg/dL (ref 7–21)
BUN / CREAT RATIO: 10
CALCIUM: 8.8 mg/dL (ref 8.5–10.2)
CHLORIDE: 100 mmol/L (ref 98–107)
CO2: 32 mmol/L — ABNORMAL HIGH (ref 22.0–30.0)
CREATININE: 1.34 mg/dL — ABNORMAL HIGH (ref 0.70–1.30)
EGFR CKD-EPI AA MALE: 74 mL/min/{1.73_m2} (ref >=60)
EGFR CKD-EPI NON-AA MALE: 64 mL/min/{1.73_m2} (ref >=60)
POTASSIUM: 4.1 mmol/L (ref 3.5–5.0)
SODIUM: 137 mmol/L (ref 135–145)

## 2019-02-10 LAB — ADDON DIFFERENTIAL ONLY
BASOPHILS ABSOLUTE COUNT: 0 10*9/L (ref 0.0–0.1)
BASOPHILS RELATIVE PERCENT: 0.9 %
EOSINOPHILS ABSOLUTE COUNT: 0 10*9/L (ref 0.0–0.4)
EOSINOPHILS RELATIVE PERCENT: 2.9 %
LARGE UNSTAINED CELLS: 5 % — ABNORMAL HIGH (ref 0–4)
LYMPHOCYTES ABSOLUTE COUNT: 0.2 10*9/L — ABNORMAL LOW (ref 1.5–5.0)
MONOCYTES ABSOLUTE COUNT: 0.1 10*9/L — ABNORMAL LOW (ref 0.2–0.8)
NEUTROPHILS ABSOLUTE COUNT: 0.7 10*9/L — ABNORMAL LOW (ref 2.0–7.5)
NEUTROPHILS RELATIVE PERCENT: 63.6 %

## 2019-02-10 LAB — CBC
HEMATOCRIT: 26.6 % — ABNORMAL LOW (ref 41.0–53.0)
HEMOGLOBIN: 8.6 g/dL — ABNORMAL LOW (ref 13.5–17.5)
MEAN CORPUSCULAR HEMOGLOBIN CONC: 32.2 g/dL (ref 31.0–37.0)
MEAN CORPUSCULAR HEMOGLOBIN: 28.4 pg (ref 26.0–34.0)
MEAN CORPUSCULAR VOLUME: 88.2 fL (ref 80.0–100.0)
PLATELET COUNT: 144 10*9/L — ABNORMAL LOW (ref 150–440)
RED BLOOD CELL COUNT: 3.02 10*12/L — ABNORMAL LOW (ref 4.50–5.90)
RED CELL DISTRIBUTION WIDTH: 17.2 % — ABNORMAL HIGH (ref 12.0–15.0)
WBC ADJUSTED: 1.1 10*9/L — ABNORMAL LOW (ref 4.5–11.0)

## 2019-02-10 LAB — MEAN CORPUSCULAR VOLUME: Erythrocyte mean corpuscular volume:EntVol:Pt:RBC:Qn:Automated count: 88.2

## 2019-02-10 LAB — MAGNESIUM: Magnesium:MCnc:Pt:Ser/Plas:Qn:: 1.9

## 2019-02-10 LAB — TACROLIMUS, TROUGH: Lab: 6.9

## 2019-02-10 MED ORDER — BISACODYL 10 MG RE SUPP
10.00 | RECTAL | Status: DC
Start: 2019-02-11 — End: 2019-02-10

## 2019-02-10 MED ORDER — POTASSIUM & SODIUM PHOSPHATES 280-160-250 MG PO PACK
2.00 | PACK | ORAL | Status: DC
Start: 2019-02-10 — End: 2019-02-10

## 2019-02-10 MED ORDER — MYCOPHENOLATE SODIUM 360 MG PO TBEC
360.00 | DELAYED_RELEASE_TABLET | ORAL | Status: DC
Start: ? — End: 2019-02-10

## 2019-02-10 MED ORDER — GENERIC EXTERNAL MEDICATION
Status: DC
Start: ? — End: 2019-02-10

## 2019-02-10 MED ORDER — HEPARIN SODIUM (PORCINE) 5000 UNIT/ML IJ SOLN
5000.00 | INTRAMUSCULAR | Status: DC
Start: 2019-02-10 — End: 2019-02-10

## 2019-02-10 MED ORDER — ONDANSETRON HCL 4 MG/2ML IJ SOLN
4.00 | INTRAMUSCULAR | Status: DC
Start: ? — End: 2019-02-10

## 2019-02-10 MED ORDER — DSS 100 MG PO CAPS
100.00 | ORAL_CAPSULE | ORAL | Status: DC
Start: 2019-02-10 — End: 2019-02-10

## 2019-02-10 MED ORDER — CYCLOBENZAPRINE HCL 10 MG PO TABS
5.00 | ORAL_TABLET | ORAL | Status: DC
Start: ? — End: 2019-02-10

## 2019-02-10 MED ORDER — POLYETHYLENE GLYCOL 3350 17 GM/SCOOP PO POWD
17.00 | ORAL | Status: DC
Start: 2019-02-10 — End: 2019-02-10

## 2019-02-10 MED ORDER — GABAPENTIN 100 MG PO CAPS
200.00 | ORAL_CAPSULE | ORAL | Status: DC
Start: 2019-02-10 — End: 2019-02-10

## 2019-02-10 MED ORDER — TACROLIMUS 1 MG PO CAPS
1.00 | ORAL_CAPSULE | ORAL | Status: DC
Start: 2019-02-10 — End: 2019-02-10

## 2019-02-10 MED ORDER — TACROLIMUS 1 MG PO CAPS
2.00 | ORAL_CAPSULE | ORAL | Status: DC
Start: 2019-02-11 — End: 2019-02-10

## 2019-02-10 MED ORDER — LACTATED RINGERS IV SOLN
10.00 | INTRAVENOUS | Status: DC
Start: ? — End: 2019-02-10

## 2019-02-10 MED ORDER — ACETAMINOPHEN 500 MG PO TABS
1000.00 | ORAL_TABLET | ORAL | Status: DC
Start: 2019-02-10 — End: 2019-02-10

## 2019-02-10 MED ORDER — ESCITALOPRAM OXALATE 10 MG PO TABS
10.00 | ORAL_TABLET | ORAL | Status: DC
Start: 2019-02-10 — End: 2019-02-10

## 2019-02-10 MED ORDER — LIDOCAINE 5 % EX PTCH
1.00 | MEDICATED_PATCH | CUTANEOUS | Status: DC
Start: 2019-02-11 — End: 2019-02-10

## 2019-02-10 MED ORDER — MYCOPHENOLATE SODIUM 180 MG TABLET,DELAYED RELEASE
ORAL_TABLET | Freq: Two times a day (BID) | ORAL | 3 refills | 90.00000 days | Status: CP
Start: 2019-02-10 — End: 2020-02-10

## 2019-02-10 MED ORDER — DOCUSATE SODIUM 100 MG CAPSULE
ORAL_CAPSULE | Freq: Two times a day (BID) | ORAL | 0 refills | 15.00000 days | Status: CP | PRN
Start: 2019-02-10 — End: ?
  Filled 2019-02-10 (×2): qty 30, 15d supply, fill #0

## 2019-02-10 MED ORDER — GABAPENTIN 100 MG CAPSULE
ORAL_CAPSULE | Freq: Every evening | ORAL | 3 refills | 15 days | Status: CP | PRN
Start: 2019-02-10 — End: ?
  Filled 2019-02-10: qty 30, 15d supply, fill #0

## 2019-02-10 MED FILL — DOK 100 MG CAPSULE: 15 days supply | Qty: 30 | Fill #0 | Status: AC

## 2019-02-10 MED FILL — GABAPENTIN 100 MG CAPSULE: 15 days supply | Qty: 30 | Fill #0 | Status: AC

## 2019-02-10 MED FILL — POLYETHYLENE GLYCOL 3350 17 GRAM ORAL POWDER PACKET: 14 days supply | Qty: 14 | Fill #0 | Status: AC

## 2019-02-10 MED FILL — ACETAMINOPHEN 500 MG TABLET: 6 days supply | Qty: 50 | Fill #0 | Status: AC

## 2019-02-10 MED FILL — OXYCODONE 5 MG TABLET: 7 days supply | Qty: 40 | Fill #0 | Status: AC

## 2019-02-10 NOTE — Unmapped (Signed)
VSS. Afebrile. Tolerating regular diet without complaints of nausea or vomiting. Ambulates independently. Having bowel movements. PCA discontinued this morning. Pain controlled with po pain meds. Foley removed this morning and voiding spontaneously. Discharge instructions reviewed with patient and all questions answered at this time. Patient will discharge to home with self care.    Problem: Adult Inpatient Plan of Care  Goal: Plan of Care Review  Outcome: Progressing  Goal: Patient-Specific Goal (Individualization)  Outcome: Progressing  Goal: Absence of Hospital-Acquired Illness or Injury  Outcome: Progressing  Goal: Optimal Comfort and Wellbeing  Outcome: Progressing  Goal: Readiness for Transition of Care  Outcome: Progressing  Goal: Rounds/Family Conference  Outcome: Progressing     Problem: Skin Injury Risk Increased  Goal: Skin Health and Integrity  Outcome: Progressing     Problem: Bleeding (Surgery Nonspecified)  Goal: Absence of Bleeding  Outcome: Progressing     Problem: Risk for Infection (Surgery Nonspecified)  Goal: Absence of Infection Signs/Symptoms  Outcome: Progressing     Problem: Pain Acute  Goal: Optimal Pain Control  Outcome: Progressing     Problem: Fall Injury Risk  Goal: Absence of Fall and Fall-Related Injury  Outcome: Progressing     Problem: Venous Thromboembolism  Goal: VTE (Venous Thromboembolism) Symptom Resolution  Outcome: Progressing     Problem: Self-Care Deficit  Goal: Improved Ability to Complete Activities of Daily Living  Outcome: Progressing

## 2019-02-10 NOTE — Unmapped (Signed)
Pt encouraged to take oral pain medications vs PCA dilaudid. Oxycodone given once overnight. Pt reports 3 BMs during day yesterday. Ambulates independently. Foley catheter intact and draining yellow urine.   Problem: Adult Inpatient Plan of Care  Goal: Plan of Care Review  Outcome: Progressing  Goal: Patient-Specific Goal (Individualization)  Outcome: Progressing  Goal: Absence of Hospital-Acquired Illness or Injury  Outcome: Progressing  Goal: Optimal Comfort and Wellbeing  Outcome: Progressing  Goal: Readiness for Transition of Care  Outcome: Progressing  Goal: Rounds/Family Conference  Outcome: Progressing     Problem: Skin Injury Risk Increased  Goal: Skin Health and Integrity  Outcome: Progressing     Problem: Bleeding (Surgery Nonspecified)  Goal: Absence of Bleeding  Outcome: Progressing     Problem: Risk for Infection (Surgery Nonspecified)  Goal: Absence of Infection Signs/Symptoms  Outcome: Progressing     Problem: Pain Acute  Goal: Optimal Pain Control  Outcome: Progressing     Problem: Fall Injury Risk  Goal: Absence of Fall and Fall-Related Injury  Outcome: Progressing     Problem: Venous Thromboembolism  Goal: VTE (Venous Thromboembolism) Symptom Resolution  Outcome: Progressing     Problem: Self-Care Deficit  Goal: Improved Ability to Complete Activities of Daily Living  Outcome: Progressing

## 2019-02-10 NOTE — Unmapped (Signed)
Discharge Summary    Admit date: 02/05/2019    Discharge date and time: 02/10/19    Discharge to:  Home    Discharge Service: Surg Transplant [facility])    Discharge Attending Physician: Lilyan Punt [facility]*    Discharge Diagnoses: s/p bilateral native kidney nephrectomy    Secondary Diagnosis: Active Problems:    Allergic transfusion reaction  - ADPKD    OR Procedures: Bilateral - Nephrectomy, W/Part Ureterectomy, Any Open Approach W/Rib Resection; Complicated Previous Surg Same Kidney 02/05/2019     Ancillary Procedures: none    Discharge Day Services: The patient was examined by the [facility] service on day of discharge and deemed suitable for discharge home. Discharge instructions were provided and return precautions were discussed.    Subjective   Patient feels well this morning and would like to go home. He does not have any abdominal pain but he continues to report right shoulder soreness. Tolerating a regular diet and had several bowel movements yesterday.    Objective   Patient Vitals for the past 8 hrs:   BP Temp Temp src Pulse Resp SpO2   02/10/19 0438 139/78 36.1 ??C Oral 77 18 95 %   02/09/19 2313 125/69 36.6 ??C Oral 69 18 98 %     I/O this shift:  In: 460 [P.O.:460]  Out: 2685 [Urine:2550; Drains:135]    General: reclined in bed, appears comfortable  Neuro: alert and oriented  CV: regular rate  Pulm: non-labored respirations on room air  Abdomen: soft, appropriately tender, non-distended. Incisions clean and dry with no erythema or drainage. JP serosanguinous.   GU: Foley in place with clear yellow urine in tube - removed after rounds    Hospital Course: Ethan West is a 44 y.o. 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 Intraoperative findings were 20cm bilateral native kidneys, left one stuck with several adherent cysts and filled with old blood. The patient tolerated the procedure well, recovered in the PACU, and was transferred to the ISCU for monitored care thereafter. He had an epidural placed pre-operatively and APS managed his pain throughout his hospital course. The patient was made floor status on POD2 and his died was advanced as tolerated. His Foley was removed on POD5 and he passed a subsequent trial of void.    On day of discharge the patient was tolerating a regular diet, ambulating independently, voiding adequately, and his pain was controlled with oral pain medication. He was discharged on POD5 in good condition and will follow up in clinic in 1-2 weeks.     Condition at Discharge: Improved  Discharge Medications:      Your Medication List      START taking these medications    acetaminophen 500 MG tablet  Commonly known as: TYLENOL  Take 1-2 tablets (500-1,000 mg total) by mouth every six (6) hours as needed for pain.     docusate sodium 100 MG capsule  Commonly known as: COLACE  Take 1 capsule (100 mg total) by mouth two (2) times a day as needed for constipation.     oxyCODONE 5 MG immediate release tablet  Commonly known as: ROXICODONE  Take 1-2 tablets (5-10 mg total) by mouth every six (6) hours as needed for pain.     polyethylene glycol 17 gram packet  Commonly known as: MIRALAX  Take 17 g (1 packet mixed in 4 to 8 oz. of liquid) by mouth daily as needed.  CONTINUE taking these medications    amLODIPine 10 MG tablet  Commonly known as: NORVASC  Take 1 tablet (10 mg total) by mouth daily.     cholecalciferol (vitamin D3) 50 mcg (2,000 unit) Cap  Take 1 capsule (2,000 Units total) by mouth daily.     escitalopram oxalate 10 MG tablet  Commonly known as: LEXAPRO  TAKE 1 TABLET BY MOUTH EVERY DAY     mycophenolate 180 MG EC tablet  Commonly known as: MYFORTIC  Take 3 tablets (540 mg total) by mouth Two (2) times a day.     tacrolimus 1 MG capsule  Commonly known as: PROGRAF  Take 2 capsules in the morning and take 1 capsules at night     valGANciclovir 450 mg tablet  Commonly known as: VALCYTE  Take 2 tablets (900 mg total) by mouth two (2) times a day.            Pending Test Results: surgical pathology exam    Discharge Instructions:    Other Instructions     Discharge instructions      Activity: Do not lift > 10-15 lbs for 1st 6 weeks, then gradually increase to 25 lbs over the following 6 weeks. Resume heavy lifting only after being cleared to do so at follow-up appointment in Transplant Surgery clinic.    Diet: Regular    Contact your transplant coordinator or the Transplant Surgery Office [phone]) during business hours or page the transplant coordinator on call [phone]) after business hours for:    - fever >100.5 degrees F by mouth, any fever with shaking chills, or other signs or symptoms of infection   - uncontrolled nausea, vomiting, or diarrhea; inability to have a bowel movement for > 3 days.   - any problem that prevents taking medications as scheduled.   - pain uncontrolled with prescribed medication or new pain or tenderness at the surgical site   - sudden weight gain or increase in blood pressure (greater than 140/85)   - shortness of breath, chest pain / discomfort   - new or increasing jaundice   - urinary symptoms including pain / difficulty / burning or tea-colored urine   - any other new or concerning symptoms   - questions regarding your medications or continuing care      Patient may shower, but should not immerse wounds in bath or pool for 2-3 weeks. Wash the surgical site with mild soap and water, but do not scrub vigorously.    You may dress wounds with dry gauze and tape to avoid soilage.    Do not drive or operate heavy machinery prior to MD clearance, or at any time while taking narcotics.    Inspect surgical sites at least twice daily, contact Transplant Coordinator for spreading redness, purulent discharge, or increasing bleeding or drainage, or for separation of wounds.             Labs and Other Follow-ups after Discharge:  Follow Up instructions and Outpatient Referrals     Discharge instructions          Future Appointments:  Appointments which have been scheduled for you    Feb 14, 2019  3:00 PM  (Arrive by 2:45 PM)  VIDEO VISIT- OTHER with Leeroy Bock, MD  [facility] TRANSPL[facility] [address]  [facility] REGION) [address]  [place] [address]  [phone]

## 2019-02-10 NOTE — Unmapped (Signed)
Problem: Adult Inpatient Plan of Care  Goal: Plan of Care Review  02/10/2019 1059 by Letitia Neri, RN  Outcome: Resolved  02/10/2019 1053 by Letitia Neri, RN  Outcome: Progressing  Goal: Patient-Specific Goal (Individualization)  02/10/2019 1059 by Letitia Neri, RN  Outcome: Resolved  02/10/2019 1053 by Letitia Neri, RN  Outcome: Progressing  Goal: Absence of Hospital-Acquired Illness or Injury  02/10/2019 1059 by Letitia Neri, RN  Outcome: Resolved  02/10/2019 1053 by Letitia Neri, RN  Outcome: Progressing  Goal: Optimal Comfort and Wellbeing  02/10/2019 1059 by Letitia Neri, RN  Outcome: Resolved  02/10/2019 1053 by Letitia Neri, RN  Outcome: Progressing  Goal: Readiness for Transition of Care  02/10/2019 1059 by Letitia Neri, RN  Outcome: Resolved  02/10/2019 1053 by Letitia Neri, RN  Outcome: Progressing  Goal: Rounds/Family Conference  02/10/2019 1059 by Letitia Neri, RN  Outcome: Resolved  02/10/2019 1053 by Letitia Neri, RN  Outcome: Progressing     Problem: Skin Injury Risk Increased  Goal: Skin Health and Integrity  02/10/2019 1059 by Letitia Neri, RN  Outcome: Resolved  02/10/2019 1053 by Letitia Neri, RN  Outcome: Progressing     Problem: Bleeding (Surgery Nonspecified)  Goal: Absence of Bleeding  02/10/2019 1059 by Letitia Neri, RN  Outcome: Resolved  02/10/2019 1053 by Letitia Neri, RN  Outcome: Progressing     Problem: Risk for Infection (Surgery Nonspecified)  Goal: Absence of Infection Signs/Symptoms  02/10/2019 1059 by Letitia Neri, RN  Outcome: Resolved  02/10/2019 1053 by Letitia Neri, RN  Outcome: Progressing     Problem: Pain Acute  Goal: Optimal Pain Control  02/10/2019 1059 by Letitia Neri, RN  Outcome: Resolved  02/10/2019 1053 by Letitia Neri, RN  Outcome: Progressing     Problem: Fall Injury Risk  Goal: Absence of Fall and Fall-Related Injury  02/10/2019 1059 by Letitia Neri, RN  Outcome: Resolved 02/10/2019 1053 by Letitia Neri, RN  Outcome: Progressing     Problem: Venous Thromboembolism  Goal: VTE (Venous Thromboembolism) Symptom Resolution  02/10/2019 1059 by Letitia Neri, RN  Outcome: Resolved  02/10/2019 1053 by Letitia Neri, RN  Outcome: Progressing     Problem: Self-Care Deficit  Goal: Improved Ability to Complete Activities of Daily Living  02/10/2019 1059 by Letitia Neri, RN  Outcome: Resolved  02/10/2019 1053 by Letitia Neri, RN  Outcome: Progressing

## 2019-02-10 NOTE — Unmapped (Signed)
Nephrology Consult Note    Requesting Attending Physician:  Lilyan Punt [facility]*  Service Requesting Consult:  Surg Transplant [facility])    ASSESSMENT:    Ethan West is a 44 y.o. 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 Norma Fredrickson on 02/05/19.       LDKT 06/2018 / baseline around 1.6  Immunosuppression prograf / myfortic / Goal : 6-8  ADPKD : s/p b/l native nephrectomy   Anemia: iron def + post surgery   Hypophatemia  Intractable pain post surgery  - Now improved. Off epidural, acute pain team signed off. Management per their recommendations. Appreciate assistance in care      RECOMMENDATIONS:    ?? Renal function holding stable   ?? Continue to follow labs closely / avoid nephrotoxins / hypotension/ contrast study   ?? Tac level acceptable this am. previous on 1/15 was below goal at 7hr. However dose changed from 1mg  BID to 2 - 1mg  on 1/14. Will adjust dose if needed based on levels today.   ?? Leucopenia 1100 with ANC at 700 - agree with neupogen , decrease myfortic to 360mg  BID  ?? Repeat iron panel check shows persistent severe iron deficiency. he needs IV supplementation - recommend ferrlecit 250mg  IV x1 prior to d/c  ?? Po intake has improved.   ?? hypophasphatemia - on PO supplementatin. Received IV x1 this am in anticipation of discharge;   ?? Needs follow up appointment with surgery - can get second dose of ferrlecit 250mg  IV that day.     Recommendations conveyed to primary team.     Seen and discussed with Dr. Toni Arthurs.   ___________________________      Reason for Consult: renal transplant     Subjective:   Feeling better over the weekend. Off PCA this morning. Pain in control with current management. Able to ambulate with minimal difficulty. Voiding well without issues, no hematuria. Appetite improved.  Wishes to go home.     Current Facility-Administered Medications   Medication Dose Route Frequency Provider Last Rate Last Admin   ??? acetaminophen (TYLENOL) tablet 1,000 mg  1,000 mg Oral [facility] Willeen Cass, MD   1,000 mg at 02/10/19 2956   ??? bisacodyL (DULCOLAX) suppository 10 mg  10 mg Rectal Daily Despina Pole, MD   10 mg at 02/08/19 1023   ??? cyclobenzaprine (FLEXERIL) tablet 5 mg  5 mg Oral TID PRN Despina Pole, MD   5 mg at 02/10/19 0800   ??? docusate sodium (COLACE) capsule 100 mg  100 mg Oral BID Despina Pole, MD   100 mg at 02/09/19 0816   ??? escitalopram oxalate (LEXAPRO) tablet 10 mg  10 mg Oral Nightly Willeen Cass, MD   10 mg at 02/09/19 2047   ??? gabapentin (NEURONTIN) capsule 200 mg  200 mg Oral Nightly Ky Barban, MD   200 mg at 02/09/19 2046   ??? heparin (porcine) injection 5,000 Units  5,000 Units Subcutaneous [facility] Willeen Cass, MD   5,000 Units at 02/10/19 2130   ??? HYDROmorphone (PF) (DILAUDID) injection 1 mg  1 mg Intravenous Once Willeen Cass, MD       ??? lactated [place] infusion  10 mL/hr Intravenous Continuous Willeen Cass, MD       ??? lidocaine (LIDODERM) 5 % patch 1 patch  1 patch Transdermal Daily Willeen Cass, MD   Stopped at 02/10/19 0805   ???  mycophenolate (MYFORTIC) EC tablet 540 mg  540 mg Oral BID Willeen Cass, MD   540 mg at 02/10/19 1610   ??? ondansetron (ZOFRAN) injection 4 mg  4 mg Intravenous Q8H PRN Willeen Cass, MD   4 mg at 02/08/19 1721   ??? oxyCODONE (ROXICODONE) immediate release tablet 5 mg  5 mg Oral Q4H PRN Willeen Cass, MD   5 mg at 02/06/19 1050    Or   ??? oxyCODONE (ROXICODONE) immediate release tablet 10 mg  10 mg Oral Q4H PRN Willeen Cass, MD   10 mg at 02/09/19 2051   ??? polyethylene glycol (MIRALAX) packet 17 g  17 g Oral BID Despina Pole, MD   17 g at 02/09/19 0816   ??? potassium & sodium phosphates 250mg  (PHOS-NAK/NEUTRA PHOS) packet 2 packet  2 packet Oral 4x Daily Despina Pole, MD   2 packet at 02/10/19 0754   ??? tacrolimus (PROGRAF) capsule 1 mg  1 mg Oral Nightly (2000) Willeen Cass, MD   1 mg at 02/09/19 1756   ??? tacrolimus (PROGRAF) capsule 2 mg  2 mg Oral Daily Willeen Cass, MD   2 mg at 02/10/19 9604   ??? tbo-filgrastim (GRANIX) injection 300 mcg  300 mcg Subcutaneous Once Despina Pole, MD           Objective:     Vitals:    02/10/19 0806   BP: 132/79   Pulse: 100   Resp: 19   Temp: 37.1 ??C   SpO2: 95%      Gen: appropriate built for age , NAD, appears comfortable  Eyes: no pallor no icterus   ENT: wearing mask   Neck: wearing mask   CVS: normal rate, regular rhythm ,warm extremities.   Resp: normal WOB   Abd: soft , mild tenderness in new incision, clean wound , no tenderness over graft site  Ext: no edema, no muscle wasting   Cns:  Aaox3, no tremor,grossly intact   Skin:  No new rash, no lesions     Laboratory data, Imaging data, and MAR reviewed in EPIC

## 2019-02-10 NOTE — Unmapped (Signed)
VSS. Afebrile. Tolerating regular diet without complaints of nausea or vomiting. Ambulates to bathroom. Patient reports 3 bowel movements this morning. No complaints of nausea or vomiting. Epidural removed this morning. Pain controlled with dilaudid PCA and prn oxycodone. Free of falls or injuries. Will continue to monitor.    Problem: Adult Inpatient Plan of Care  Goal: Plan of Care Review  Outcome: Progressing  Goal: Patient-Specific Goal (Individualization)  Outcome: Progressing  Goal: Absence of Hospital-Acquired Illness or Injury  Outcome: Progressing  Goal: Optimal Comfort and Wellbeing  Outcome: Progressing  Goal: Readiness for Transition of Care  Outcome: Progressing  Goal: Rounds/Family Conference  Outcome: Progressing     Problem: Skin Injury Risk Increased  Goal: Skin Health and Integrity  Outcome: Progressing     Problem: Bleeding (Surgery Nonspecified)  Goal: Absence of Bleeding  Outcome: Progressing     Problem: Risk for Infection (Surgery Nonspecified)  Goal: Absence of Infection Signs/Symptoms  Outcome: Progressing     Problem: Pain Acute  Goal: Optimal Pain Control  Outcome: Progressing     Problem: Fall Injury Risk  Goal: Absence of Fall and Fall-Related Injury  Outcome: Progressing     Problem: Venous Thromboembolism  Goal: VTE (Venous Thromboembolism) Symptom Resolution  Outcome: Progressing     Problem: Self-Care Deficit  Goal: Improved Ability to Complete Activities of Daily Living  Outcome: Progressing

## 2019-02-10 NOTE — Unmapped (Signed)
Acute Pain Service Follow-Up Note- Epidural      Assessment and Plan    44 y.o. yo s/p bilateral nephrectomy by Dr. Norma Fredrickson on 02/05/19  POD# 5 with an epidural catheter for postoperative pain.    Interim:      Epidural discontinued yesterday. Patient doing well in terms of pain control, HM PCA 3.6 mg/24 hrs, Oxycodone 10 mg x2. Pain 2 to 7/10, reports adequate pain control with no associated SE.       APS signing off.      Adjuncts:   - HM PCA 0.2mg  q44min   - HM 1 mg q4hrs PRN  - Oxycodone 5-10 mg q4hrs PRN   - Tylenol 1g q8hrs Sch   - Gabapentin 200 mg HS (it can be increased if needed)  - Lidocaine patches     If applicable, naloxone available for sedation or respiratory depression.  If necessary, low dose naloxone infusion available for opiate related pruritis or nausea.    No anesthesia/epidural need for foley catheter.  Please remove as soon as clinically appropriate.    Contact APS before starting any anticoagulant other than [place] heparin.    Contact the Acute Pain Service first at [phone] with questions or concerns 24 hours a day.  If unable to reach, please contact on call Anesthesiology resident at [phone].    CPT daily management of epidural 47829  Diagnosis: abdominal pain      History    Patient is a 44 y.o. yo  s/p bilateral nephrectomy by Dr. Norma Fredrickson on 02/05/19     Daily Hospital Course    Epidural Catheter  Level: T9/10  LOR:  6cm  Catheter at skin: 11cm  Catheter Start Date: 02/05/19  Catheter STOP date: 02/09/19  Postop/Catheter Day: 5    Analgesia Evaluation:  Pain at rest: 2/10  Pain with activity:7/10    Complications:  Pruritis: No  Urinary Retention: No  Nausea/Vomiting: No  Sedation: No  Respiratory Depression: No    Physical Exam    Vitals:    02/10/19 0806   BP: 132/79   Pulse: 100   Resp: 19   Temp: 37.1 ??C (98.8 ??F)   SpO2: 95%   BP 143/67    GEN: Patient lying in bed, in no distress  CHEST: Unlabored breathing, supplemental oxygen is not present SKIN: epidural dressing clean, moist due to sweat, epidural catheter tip subcutaneous 5 mm, otherwise catheter migrated out; no erythema at insertion site, no fluctuance or tenderness noted  GENITOURINARY: foley catheter is present  NEUROLOGIC: patient moves all extremities, 5/5 strength in lower extremities, alert and oriented to person, place and time; other deficits noted: None

## 2019-02-14 ENCOUNTER — Encounter: Admit: 2019-02-14 | Discharge: 2019-02-14 | Payer: BLUE CROSS/BLUE SHIELD | Attending: Nephrology | Primary: Nephrology

## 2019-02-14 NOTE — Unmapped (Signed)
Attempted to make an appt for the patient for JP removal. Patient selected 10:45am on Thurs Jan 28.  I am unsure if referral I entered was entered correctly and I reached out for help from Durenda Hurt to complete the appointment scheduling. She scheduled the appointment easily from the referral. Loma Boston, RN Inpatient Transplant Nurse Coordinator 02/14/2019 12:32 PM

## 2019-02-14 NOTE — Unmapped (Signed)
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  Urine analysis: 42 WBC/ >182 RBC - 01/02/19 - no repeat.  Urine protein/creatinine ratio: 2.693.  DSA: negative 01/20/19    Last CMV checked: 02/03/19 - negative - stopped his valcyte today.  Last BKV checked: no decoy cells - 01/02/19    2) Immunosuppression: Prograf 2/1 mg BID / Myfortic 540 mg BID  - Last Prograf level 6.9 on 02/10/19 and Last dose of Prograf: 9 PM  - Goal of 12 hour Prograf trough: 7-9  - Changes in Immunosuppression - No changes.  - Medications side effects: Minor tremors, leucopenia    Repeat CBC on Monday to assess the need for granix.    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    9) s/o native nephrectomy - drain present.  F/U surgery appointment next week.      Follow-up: Virtual visit in 4-6 weeks     I spent 12 minutes on the real-time audio and video with the patient. I spent an additional 20 minutes on pre- and post-visit activities.     The patient was physically located in [place] or a state in which I am permitted to provide care. The patient and/or parent/guardian understood that s/he may incur co-pays and cost sharing, and agreed to the telemedicine visit. The visit was reasonable and appropriate under the circumstances given the patient's presentation at the time.    The patient and/or parent/guardian has been advised of the potential risks and limitations of this mode of treatment (including, but not limited to, the absence of in-person examination) and has agreed to be treated using telemedicine. The patient's/patient's family's questions regarding telemedicine have been answered.     If the visit was completed in an ambulatory setting, the patient and/or parent/guardian has also been advised to contact their provider???s office for worsening conditions, and seek emergency medical treatment and/or call 911 if the patient deems either necessary.      History of Presenting Illness:     Since patient's last visit in the transplant clinic - patient has been doing well in terms of transplant, taking transplant medications regularly, no episodes of rejection and no side effects of medications.    Since last seen, he was admitted from 1/13-1/18 for a s/p bilateral native kidney nephrectomy.     His visit today is conducted via video due to the ongoing COVID-19 pandemic.    Today he presents with c/o discomfort post-op. He feels discomfort around his ribs and lower abdominal region. He also feels  tenderness around his sternum. Compared to last week, he does not feel that much better. He stressed that it's hard to get around as a result of the drain being in the way, which also adds to his discomfort. Patient also inquired if getting the incision site and drain tube wet while showering was okay.     He was admitted 6/2-06/28/18 for living donor renal transplant (from his mother, 86 y.o.), which he received on 06/25/18. Postoperative course was uncomplicated with no delayed graft function.    Patient reports initial diagnosis of polycystic kidney disease in early 2000. He had a ruptured appendix in late '99 vs early '00, and in the process of that workup, was diagnosed with PKD. He reports complications of ruptured cysts every 1-2 years, more recently 2-3 times per year; most recent occurrence was the week prior to his transplant surgery. He experiences pain and hematuria for 3 days to 2 weeks leading up to rupture, with instant relief once the cyst ruptures; may experience some residual bleeding after rupture. He denies history of cysts in the pancreas or liver. Also denies any history of UTI. Reports family history of PKD in his father, sister, and sister's daughter.    Reports HTN control has been inconsistent over the last 20 years. He has been better controlled on current regimen of amlodipine and carvedilol.    He reports history of ibuprofen use for headaches, previously occurring 3 times per week. Headaches are currently resolved. He believes they were occurring back when his BPs were labile.     Last dose of Prograf at 9PM   Diabetes: No   HTN: Yes    Controlled:Yes   CAD/Heartfailure: No   Adherence      With Medication: yes    With Follow up: yes    Functional Status: Independent    Review of Systems:     Fever or chills: negative   Sore throat: negative   Fatigue/malaise: negative   Weight loss or gain: negative   New skin rash/lump or bump: negative   Problems with teeth/gums: negative   Chest pain: negative   Cough or shortness of breath: present.  Swelling: negative   Abdominal pain/heartburn/nausea/vomiting or diarrhea: negative   Pain or bleeding when urinating: negative   Twitching/numbness or weakness: negative     Physical Exam:     Not done    Renal Transplant History:    Race: Caucasian   Age of recipient (at time of transplant): 44 y.o.   Cause of kidney disease: ADPKD   Native biopsy: No    Date of transplant: 06/25/2018   Type of transplant: LR    - Donor creatinine: N/A    - Any co morbidities: None    - Infection in donor: none    - Ischemia time: 2h 76m    - Crossmatch: negative    - Donor kidney biopsy (06/25/18): moderate arteriosclerosis Induction: Campath - Alemtuzumab   Maintenance IS at the time of transplant: tacrolimus, Myfortic   DGF: No    Allergies:   Allergies   Allergen Reactions   ??? Benazepril Rash   ??? Clarithromycin Rash   ??? Penicillins Rash     Tolerates cefepime and cefazolin.        Current Medications:   Current Outpatient Medications   Medication Sig Dispense Refill   ??? acetaminophen (TYLENOL) 500 MG tablet Take 1-2 tablets (500-1,000 mg total) by mouth every six (6) hours as needed for pain. 50 tablet  0   ??? amLODIPine (NORVASC) 10 MG tablet Take 1 tablet (10 mg total) by mouth daily. 30 tablet 11   ??? cholecalciferol, vitamin D3, 50 mcg (2,000 unit) cap Take 1 capsule (2,000 Units total) by mouth daily. 100 each 0   ??? docusate sodium (COLACE) 100 MG capsule Take 1 capsule (100 mg total) by mouth two (2) times a day as needed for constipation. 30 each 0   ??? docusate sodium (COLACE) 100 MG capsule Take 1 capsule (100 mg total) by mouth two (2) times a day as needed for constipation. 30 capsule 0   ??? escitalopram oxalate (LEXAPRO) 10 MG tablet TAKE 1 TABLET BY MOUTH EVERY DAY 90 tablet 1   ??? gabapentin (NEURONTIN) 100 MG capsule Take 2 capsules (200 mg total) by mouth nightly as needed for up to 30 doses. 30 capsule 3   ??? mycophenolate (MYFORTIC) 180 MG EC tablet Take 2 tablets (360 mg total) by mouth Two (2) times a day. 360 tablet 3   ??? oxyCODONE (ROXICODONE) 5 MG immediate release tablet Take 1-2 tablets (5-10 mg total) by mouth every six (6) hours as needed for pain. 40 tablet 0   ??? polyethylene glycol (MIRALAX) 17 gram packet Take 17 g (1 packet mixed in 4 to 8 oz. of liquid) by mouth daily as needed. 14 each 0   ??? tacrolimus (PROGRAF) 1 MG capsule Take 2 capsules in the morning and take 1 capsules at night 450 capsule 3   ??? valGANciclovir (VALCYTE) 450 mg tablet Take 2 tablets (900 mg total) by mouth two (2) times a day. 120 tablet 6     Current Facility-Administered Medications   Medication Dose Route Frequency Provider Last Rate Last Admin   ??? ferumoxytoL (FERAHEME) injection 510 mg  510 mg Intravenous Once Leeroy Bock, MD           Past Medical History:   Past Medical History:   Diagnosis Date   ??? ADPKD (autosomal dominant polycystic kidney disease)    ??? Hypertension    ??? Kidney stone    ??? Polycystic kidney disease         Laboratory studies:     Recent Results (from the past 170 hour(s))   Tacrolimus Level, Trough    Collection Time: 02/07/19  4:36 AM   Result Value Ref Range    Tacrolimus, Trough 6.2 5.0 - 15.0 ng/mL   Basic Metabolic Panel    Collection Time: 02/07/19  4:36 AM   Result Value Ref Range    Sodium 134 (L) 135 - 145 mmol/L    Potassium 5.0 3.5 - 5.0 mmol/L    Chloride 105 98 - 107 mmol/L    CO2 25.0 22.0 - 30.0 mmol/L    Anion Gap 4 (L) 7 - 15 mmol/L    BUN 17 7 - 21 mg/dL    Creatinine 2.95 (H) 0.70 - 1.30 mg/dL    BUN/Creatinine Ratio 13     EGFR CKD-EPI Non-African American, Male 47 >=60 mL/min/1.50m2    EGFR CKD-EPI African American, Male 81 >=60 mL/min/1.91m2    Glucose 86 70 - 179 mg/dL    Calcium 8.5 8.5 - 62.1 mg/dL   CBC    Collection Time: 02/07/19  4:36 AM   Result Value Ref Range    WBC 2.8 (L) 4.5 - 11.0 10*9/L    RBC 3.10 (L) 4.50 - 5.90 10*12/L    HGB 8.6 (L) 13.5 - 17.5 g/dL    HCT 30.8 (  L) 41.0 - 53.0 %    MCV 88.6 80.0 - 100.0 fL    MCH 27.8 26.0 - 34.0 pg    MCHC 31.3 31.0 - 37.0 g/dL    RDW 16.1 (H) 09.6 - 15.0 %    MPV 9.1 7.0 - 10.0 fL    Platelet 121 (L) 150 - 440 10*9/L   Magnesium Level    Collection Time: 02/07/19  4:36 AM   Result Value Ref Range    Magnesium 1.6 1.6 - 2.2 mg/dL   Phosphorus Level    Collection Time: 02/07/19  4:36 AM   Result Value Ref Range    Phosphorus 2.6 (L) 2.9 - 4.7 mg/dL   Iron Panel    Collection Time: 02/07/19  4:36 AM   Result Value Ref Range    Iron <10 (L) 35 - 165 ug/dL    TIBC 045.4 (L) 098.1 - 479.0 mg/dL    Transferrin 191.4 (L) 200.0 - 380.0 mg/dL    Iron Saturation (%)     Prepare RBC    Collection Time: 02/08/19 11:43 PM   Result Value Ref Range Crossmatch Compatible     Unit Blood Type B Pos     ISBT Number 7300     Unit # [id number]     Status Released to Avail     Spec Expiration [id number]     Product ID Red Blood Cells     PRODUCT CODE [id number]     Crossmatch Compatible     Unit Blood Type B Pos     ISBT Number 7300     Unit # [id number]     Status Transfused     Product ID Red Blood Cells     PRODUCT CODE [id number]     Crossmatch Compatible     Unit Blood Type B Pos     ISBT Number 7300     Unit # [id number]     Status Released to Avail     Spec Expiration [id number]     Product ID Red Blood Cells     PRODUCT CODE [id number]     Crossmatch Compatible     Unit Blood Type B Pos     ISBT Number 7300     Unit # [id number]     Status Transfused     Product ID Red Blood Cells     PRODUCT CODE [id number]    Basic Metabolic Panel    Collection Time: 02/09/19  5:06 AM   Result Value Ref Range    Sodium 132 (L) 135 - 145 mmol/L    Potassium 4.6 3.5 - 5.0 mmol/L    Chloride 101 98 - 107 mmol/L    CO2 26.0 22.0 - 30.0 mmol/L    Anion Gap 5 (L) 7 - 15 mmol/L    BUN 15 7 - 21 mg/dL    Creatinine 3.23 5.57 - 1.30 mg/dL    BUN/Creatinine Ratio 12     EGFR CKD-EPI Non-African American, Male 70 >=60 mL/min/1.9m2    EGFR CKD-EPI African American, Male 40 >=60 mL/min/1.53m2    Glucose 104 70 - 179 mg/dL    Calcium 8.7 8.5 - 32.2 mg/dL   CBC    Collection Time: 02/09/19  5:06 AM   Result Value Ref Range    WBC 1.0 (L) 4.5 - 11.0 10*9/L    RBC 3.18 (L) 4.50 - 5.90 10*12/L    HGB 8.9 (L) 13.5 - 17.5 g/dL    HCT 02.5 (L) 42.7 -  53.0 %    MCV 88.8 80.0 - 100.0 fL    MCH 28.1 26.0 - 34.0 pg    MCHC 31.6 31.0 - 37.0 g/dL    RDW 16.1 (H) 09.6 - 15.0 %    MPV 8.4 7.0 - 10.0 fL    Platelet 146 (L) 150 - 440 10*9/L   Magnesium Level    Collection Time: 02/09/19  5:06 AM   Result Value Ref Range    Magnesium 1.9 1.6 - 2.2 mg/dL   Phosphorus Level    Collection Time: 02/09/19  5:06 AM   Result Value Ref Range    Phosphorus 2.0 (L) 2.9 - 4.7 mg/dL   Basic Metabolic Panel    Collection Time: 02/10/19  5:05 AM   Result Value Ref Range    Sodium 137 135 - 145 mmol/L    Potassium 4.1 3.5 - 5.0 mmol/L    Chloride 100 98 - 107 mmol/L    CO2 32.0 (H) 22.0 - 30.0 mmol/L    Anion Gap 5 (L) 7 - 15 mmol/L    BUN 14 7 - 21 mg/dL    Creatinine 0.45 (H) 0.70 - 1.30 mg/dL    BUN/Creatinine Ratio 10     EGFR CKD-EPI Non-African American, Male 67 >=60 mL/min/1.37m2    EGFR CKD-EPI African American, Male 62 >=60 mL/min/1.41m2    Glucose 105 70 - 179 mg/dL    Calcium 8.8 8.5 - 40.9 mg/dL   Magnesium Level    Collection Time: 02/10/19  5:05 AM   Result Value Ref Range    Magnesium 1.9 1.6 - 2.2 mg/dL   Phosphorus Level    Collection Time: 02/10/19  5:05 AM   Result Value Ref Range    Phosphorus 2.5 (L) 2.9 - 4.7 mg/dL   CBC    Collection Time: 02/10/19  5:06 AM   Result Value Ref Range    WBC 1.1 (L) 4.5 - 11.0 10*9/L    RBC 3.02 (L) 4.50 - 5.90 10*12/L    HGB 8.6 (L) 13.5 - 17.5 g/dL    HCT 81.1 (L) 91.4 - 53.0 %    MCV 88.2 80.0 - 100.0 fL    MCH 28.4 26.0 - 34.0 pg    MCHC 32.2 31.0 - 37.0 g/dL    RDW 78.2 (H) 95.6 - 15.0 %    MPV 9.4 7.0 - 10.0 fL    Platelet 144 (L) 150 - 440 10*9/L   Tacrolimus Level, Trough    Collection Time: 02/10/19  5:06 AM   Result Value Ref Range    Tacrolimus, Trough 6.9 5.0 - 15.0 ng/mL   Addon Differential Only    Collection Time: 02/10/19  5:06 AM   Result Value Ref Range    Neutrophil Left Shift 1+ (A) Not Present    Neutrophils % 63.6 %    Lymphocytes % 16.2 %    Monocytes % 11.8 %    Eosinophils % 2.9 %    Basophils % 0.9 %    Absolute Neutrophils 0.7 (L) 2.0 - 7.5 10*9/L    Absolute Lymphocytes 0.2 (L) 1.5 - 5.0 10*9/L    Absolute Monocytes 0.1 (L) 0.2 - 0.8 10*9/L    Absolute Eosinophils 0.0 0.0 - 0.4 10*9/L    Absolute Basophils 0.0 0.0 - 0.1 10*9/L    Large Unstained Cells 5 (H) 0 - 4 %    Microcytosis Slight (A) Not Present    Anisocytosis Slight (A) Not Present    Hypochromasia  Marked (A) Not Present     Scribe's Attestation: Leeroy Bock, MD obtained and performed the history, physical exam and medical decision making elements that were entered into the chart.  Signed by Sharion Balloon, Scribe, on February 14, 2019 at 10:39 AM.    Documentation assistance provided by the Scribe. I was present during the time the encounter was recorded. The information recorded by the Scribe was done at my direction and has been reviewed and validated by me.

## 2019-02-17 ENCOUNTER — Encounter: Admit: 2019-02-17 | Discharge: 2019-02-18 | Payer: BLUE CROSS/BLUE SHIELD

## 2019-02-17 LAB — CBC W/ AUTO DIFF
BASOPHILS ABSOLUTE COUNT: 0 10*9/L (ref 0.0–0.1)
BASOPHILS RELATIVE PERCENT: 0.9 %
EOSINOPHILS ABSOLUTE COUNT: 0.1 10*9/L (ref 0.0–0.7)
EOSINOPHILS RELATIVE PERCENT: 2.5 %
HEMOGLOBIN: 10.5 g/dL — ABNORMAL LOW (ref 13.5–17.5)
LYMPHOCYTES ABSOLUTE COUNT: 0.3 10*9/L — ABNORMAL LOW (ref 0.7–4.0)
LYMPHOCYTES RELATIVE PERCENT: 9.3 %
MEAN CORPUSCULAR HEMOGLOBIN CONC: 32.8 g/dL (ref 30.0–36.0)
MEAN CORPUSCULAR HEMOGLOBIN: 27.7 pg (ref 26.0–34.0)
MEAN CORPUSCULAR VOLUME: 84.5 fL (ref 81.0–95.0)
MEAN PLATELET VOLUME: 7.3 fL (ref 7.0–10.0)
MONOCYTES ABSOLUTE COUNT: 0.2 10*9/L (ref 0.1–1.0)
NEUTROPHILS ABSOLUTE COUNT: 2.6 10*9/L (ref 1.7–7.7)
NEUTROPHILS RELATIVE PERCENT: 80.2 %
PLATELET COUNT: 345 10*9/L (ref 150–450)
RED BLOOD CELL COUNT: 3.8 10*12/L — ABNORMAL LOW (ref 4.32–5.72)
RED CELL DISTRIBUTION WIDTH: 18.1 % — ABNORMAL HIGH (ref 12.0–15.0)
WBC ADJUSTED: 3.2 10*9/L — ABNORMAL LOW (ref 3.5–10.5)

## 2019-02-17 LAB — RENAL FUNCTION PANEL
ALBUMIN: 3.8 g/dL (ref 3.5–5.0)
ANION GAP: 11 mmol/L (ref 7–15)
BLOOD UREA NITROGEN: 15 mg/dL (ref 7–21)
BUN / CREAT RATIO: 10
CALCIUM: 9.6 mg/dL (ref 8.5–10.2)
CO2: 20 mmol/L — ABNORMAL LOW (ref 22.0–30.0)
EGFR CKD-EPI AA MALE: 64 mL/min/{1.73_m2} (ref >=60)
GLUCOSE RANDOM: 103 mg/dL (ref 70–179)
PHOSPHORUS: 2.9 mg/dL (ref 2.9–4.7)
POTASSIUM: 5 mmol/L (ref 3.5–5.0)
SODIUM: 137 mmol/L (ref 135–145)

## 2019-02-17 LAB — SLIDE REVIEW

## 2019-02-17 LAB — MAGNESIUM
MAGNESIUM: 1.6 mg/dL (ref 1.6–2.2)
Magnesium:MCnc:Pt:Ser/Plas:Qn:: 1.6

## 2019-02-17 LAB — MEAN CORPUSCULAR HEMOGLOBIN CONC: Erythrocyte mean corpuscular hemoglobin concentration:MCnc:Pt:RBC:Qn:Automated count: 32.8

## 2019-02-17 LAB — ALBUMIN: Albumin:MCnc:Pt:Ser/Plas:Qn:: 3.8

## 2019-02-17 LAB — SMEAR REVIEW

## 2019-02-18 LAB — TACROLIMUS LEVEL, TROUGH: TACROLIMUS, TROUGH: 4.2 ng/mL — ABNORMAL LOW (ref 5.0–15.0)

## 2019-02-18 LAB — TACROLIMUS, TROUGH: Lab: 4.2 — ABNORMAL LOW

## 2019-02-19 LAB — CMV DNA, QUANTITATIVE, PCR: CMV VIRAL LD: NOT DETECTED

## 2019-02-19 LAB — CMV COMMENT: Lab: 0

## 2019-02-20 ENCOUNTER — Encounter: Admit: 2019-02-20 | Discharge: 2019-02-21 | Payer: BLUE CROSS/BLUE SHIELD | Attending: Surgery | Primary: Surgery

## 2019-02-20 ENCOUNTER — Encounter: Admit: 2019-02-20 | Discharge: 2019-02-21 | Payer: BLUE CROSS/BLUE SHIELD

## 2019-02-20 DIAGNOSIS — Z940 Kidney transplant status: Principal | ICD-10-CM

## 2019-02-20 DIAGNOSIS — R0602 Shortness of breath: Principal | ICD-10-CM

## 2019-02-20 DIAGNOSIS — Z79899 Other long term (current) drug therapy: Principal | ICD-10-CM

## 2019-02-20 LAB — CBC W/ AUTO DIFF
BASOPHILS ABSOLUTE COUNT: 0 10*9/L (ref 0.0–0.1)
BASOPHILS RELATIVE PERCENT: 1 %
EOSINOPHILS ABSOLUTE COUNT: 0.1 10*9/L (ref 0.0–0.4)
EOSINOPHILS RELATIVE PERCENT: 2.8 %
HEMATOCRIT: 36.2 % — ABNORMAL LOW (ref 41.0–53.0)
HEMOGLOBIN: 11.4 g/dL — ABNORMAL LOW (ref 13.5–17.5)
LYMPHOCYTES ABSOLUTE COUNT: 0.5 10*9/L — ABNORMAL LOW (ref 1.5–5.0)
LYMPHOCYTES RELATIVE PERCENT: 10.3 %
MEAN CORPUSCULAR HEMOGLOBIN CONC: 31.6 g/dL (ref 31.0–37.0)
MEAN CORPUSCULAR HEMOGLOBIN: 27.9 pg (ref 26.0–34.0)
MEAN CORPUSCULAR VOLUME: 88.2 fL (ref 80.0–100.0)
MEAN PLATELET VOLUME: 8.4 fL (ref 7.0–10.0)
MONOCYTES RELATIVE PERCENT: 2.7 %
NEUTROPHILS ABSOLUTE COUNT: 3.7 10*9/L (ref 2.0–7.5)
NEUTROPHILS RELATIVE PERCENT: 82.5 %
PLATELET COUNT: 474 10*9/L — ABNORMAL HIGH (ref 150–440)
RED BLOOD CELL COUNT: 4.1 10*12/L — ABNORMAL LOW (ref 4.50–5.90)
RED CELL DISTRIBUTION WIDTH: 16.7 % — ABNORMAL HIGH (ref 12.0–15.0)
WBC ADJUSTED: 4.5 10*9/L (ref 4.5–11.0)

## 2019-02-20 LAB — RENAL FUNCTION PANEL
ALBUMIN: 4.3 g/dL (ref 3.5–5.0)
ANION GAP: 8 mmol/L (ref 7–15)
BLOOD UREA NITROGEN: 19 mg/dL (ref 7–21)
CALCIUM: 10.1 mg/dL (ref 8.5–10.2)
CHLORIDE: 105 mmol/L (ref 98–107)
CO2: 25 mmol/L (ref 22.0–30.0)
CREATININE: 1.51 mg/dL — ABNORMAL HIGH (ref 0.70–1.30)
EGFR CKD-EPI AA MALE: 64 mL/min/{1.73_m2} (ref >=60)
EGFR CKD-EPI NON-AA MALE: 56 mL/min/{1.73_m2} — ABNORMAL LOW (ref >=60)
GLUCOSE RANDOM: 99 mg/dL (ref 70–99)
PHOSPHORUS: 3.3 mg/dL (ref 2.9–4.7)
POTASSIUM: 4.9 mmol/L (ref 3.5–5.0)
SODIUM: 138 mmol/L (ref 135–145)

## 2019-02-20 LAB — TACROLIMUS, TROUGH: Lab: 3.7 — ABNORMAL LOW

## 2019-02-20 LAB — CALCIUM: Calcium:MCnc:Pt:Ser/Plas:Qn:: 10.1

## 2019-02-20 LAB — SMEAR REVIEW

## 2019-02-20 LAB — MAGNESIUM: Magnesium:MCnc:Pt:Ser/Plas:Qn:: 1.6

## 2019-02-20 LAB — NEUTROPHILS ABSOLUTE COUNT: Neutrophils:NCnc:Pt:Bld:Qn:Automated count: 3.7

## 2019-02-20 MED ORDER — GABAPENTIN 100 MG CAPSULE
ORAL_CAPSULE | Freq: Every evening | ORAL | 3 refills | 15 days | PRN
Start: 2019-02-20 — End: ?

## 2019-02-20 NOTE — Unmapped (Signed)
Transplant Surgery Annual Progress Note    Assessment/Recommendations:  Ethan West is a 44 y.o. male with history of ADPKD status post Living donor kidney transplant on 06/2018 who presents for postoperative evaluation after bilateral native nephrectomies performed on 02/05/2019.    I spent 20 minutes with the patient obtaining the above history and physical examination, and greater than 50% of the time was spent on counseling and the substance of the discussion.    Ethan West had all questions answered and was urged to call us at any time if additional questions should arise.    - Obtain CXR and COVID PCR testing  - Had some episodes of hypotension, will adjust home meds.  - Continue tacrolimus  - Drain removed in clinic today  - Will remove staples in next appointment    HPI:  Ethan West is a 44 y.o. male with history of ADPKD status post Living donor kidney transplant on 06/2018 who presents for postoperative evaluation after bilateral native nephrectomies performed on 02/05/2019. He states that he is tolerating oral diet, he is ambulating and pain is well controlled. He states that he feels a little bulge in the upper portion of his incision which causes minimal pain. He does complain of increasing shortness of breath which limits his daily activities. States that even after showering he feels short of breath. No signs of fluid overload. Denies fever, cough, or other symptoms. Also complained of some hypotension.    Allergies  Benazepril, Clarithromycin, and Penicillins      Medications    Current Outpatient Medications   Medication Sig Dispense Refill   ??? acetaminophen (TYLENOL) 500 MG tablet Take 1-2 tablets (500-1,000 mg total) by mouth every six (6) hours as needed for pain. 50 tablet 0   ??? amLODIPine (NORVASC) 10 MG tablet Take 1 tablet (10 mg total) by mouth daily. 30 tablet 11   ??? cholecalciferol, vitamin D3, 50 mcg (2,000 unit) cap Take 1 capsule (2,000 Units total) by mouth daily. 100 each 0   ??? docusate sodium (COLACE) 100 MG capsule Take 1 capsule (100 mg total) by mouth two (2) times a day as needed for constipation. 30 each 0   ??? escitalopram oxalate (LEXAPRO) 10 MG tablet TAKE 1 TABLET BY MOUTH EVERY DAY 90 tablet 1   ??? gabapentin (NEURONTIN) 100 MG capsule Take 2 capsules (200 mg total) by mouth nightly as needed for up to 30 doses. 30 capsule 3   ??? mycophenolate (MYFORTIC) 180 MG EC tablet Take 2 tablets (360 mg total) by mouth Two (2) times a day. 360 tablet 3   ??? tacrolimus (PROGRAF) 1 MG capsule Take 2 capsules in the morning and take 1 capsules at night 450 capsule 3   ??? polyethylene glycol (MIRALAX) 17 gram packet Take 17 g (1 packet mixed in 4 to 8 oz. of liquid) by mouth daily as needed. (Patient not taking: Reported on 02/20/2019) 14 each 0     Current Facility-Administered Medications   Medication Dose Route Frequency Provider Last Rate Last Admin   ??? ferumoxytoL (FERAHEME) injection 510 mg  510 mg Intravenous Once Leeroy Bock, MD         Past Medical History  Past Medical History:   Diagnosis Date   ??? ADPKD (autosomal dominant polycystic kidney disease)    ??? Hypertension    ??? Kidney stone    ??? Polycystic kidney disease      Past Surgical History  Past Surgical History:   Procedure Laterality  Date   ??? APPENDECTOMY     ??? EXPLORATORY LAPAROTOMY     ??? NEPHRECTOMY TRANSPLANTED ORGAN     ??? PR AV ANAST,UP ARM BASILIC VEIN TRANSPOSIT Left 1/61/0960    Procedure: ARTERIOVENOUS ANASTOMOSIS, OPEN; BY UPPER ARM BASILIC VEIN TRANSPOSITION;  Surgeon: Leona Carry, MD;  Location: MAIN OR [facility];  Service: Transplant   ??? PR CREAT AV FISTULA,AUTOGENOUS GRAFT Left 12/05/2017    Procedure: CREATE AV FISTULA (SEPART PROC); AUTOG GFT, UPPER EXTREMITY;  Surgeon: Leona Carry, MD;  Location: MAIN OR [facility];  Service: Transplant   ??? PR REMV KIDNEY,COMPLICATED Bilateral 02/05/2019    Procedure: Nephrectomy, W/Part Ureterectomy, Any Open Approach W/Rib Resection; Complicated Previous Surg Same Kidney;  Surgeon: Leona Carry, MD;  Location: MAIN OR [facility];  Service: Transplant   ??? PR TRANSPLANT,PREP CADAVER RENAL GRAFT N/A 06/25/2018    Procedure: [facility] STD PREP CAD DONR RENAL ALLOGFT PRIOR TO TRNSPLNT, INCL DISSEC/REM PERINEPH FAT, DIAPH/RTPER ATTAC;  Surgeon: Leona Carry, MD;  Location: MAIN OR [facility];  Service: Transplant   ??? PR TRANSPLANTATION OF KIDNEY N/A 06/25/2018    Procedure: Priority RENAL ALLOTRANSPLANTATION, IMPLANTATION OF GRAFT; WITHOUT RECIPIENT NEPHRECTOMY;  Surgeon: Leona Carry, MD;  Location: MAIN OR [facility];  Service: Transplant     Review of Systems  A 12 system review of systems was negative except as noted in HPI      PE: BP 97/65  - Pulse 94  - Temp 36.9 ??C (Tympanic)  - Ht 182.9 cm (6')  - Wt 94.6 kg (208 lb 9.6 oz)  - BMI 28.29 kg/m??    General: alert, oriented, no distress  Lungs: clear to auscultation and unlabored breathing  Heart: euvolemic, regular rate and rhythm, normal S1 and S2, no murmur  Abd: Some minimal swelling around superior aspect of midline incision, no erythema or fluctuance, staples in place. Drain with minimal amount of serous output.  Skin: no rashes, jaundice or skin lesions noted  Ext: no edema, well perfused  Neuro: thought organized, appropriate affect, normal fluent speech      Test Results    All lab results last 24 hours:    Recent Results (from the past 24 hour(s))   Renal Function Panel    Collection Time: 02/20/19  9:34 AM   Result Value Ref Range    Sodium 138 135 - 145 mmol/L    Potassium 4.9 3.5 - 5.0 mmol/L    Chloride 105 98 - 107 mmol/L    CO2 25.0 22.0 - 30.0 mmol/L    Anion Gap 8 7 - 15 mmol/L    BUN 19 7 - 21 mg/dL    Creatinine 4.54 (H) 0.70 - 1.30 mg/dL    BUN/Creatinine Ratio 13     EGFR CKD-EPI Non-African American, Male 56 (L) >=60 mL/min/1.70m2    EGFR CKD-EPI African American, Male 15 >=60 mL/min/1.29m2    Glucose 99 70 - 99 mg/dL    Calcium 09.8 8.5 - 11.9 mg/dL    Phosphorus 3.3 2.9 - 4.7 mg/dL Albumin 4.3 3.5 - 5.0 g/dL   Magnesium Level    Collection Time: 02/20/19  9:34 AM   Result Value Ref Range    Magnesium 1.6 1.6 - 2.2 mg/dL   CBC w/ Differential    Collection Time: 02/20/19  9:34 AM   Result Value Ref Range    WBC 4.5 4.5 - 11.0 10*9/L    RBC 4.10 (L) 4.50 - 5.90 10*12/L    HGB 11.4 (  L) 13.5 - 17.5 g/dL    HCT 16.1 (L) 09.6 - 53.0 %    MCV 88.2 80.0 - 100.0 fL    MCH 27.9 26.0 - 34.0 pg    MCHC 31.6 31.0 - 37.0 g/dL    RDW 04.5 (H) 40.9 - 15.0 %    MPV 8.4 7.0 - 10.0 fL    Platelet 474 (H) 150 - 440 10*9/L    Neutrophils % 82.5 %    Lymphocytes % 10.3 %    Monocytes % 2.7 %    Eosinophils % 2.8 %    Basophils % 1.0 %    Neutrophil Left Shift 2+ (A) Not Present    Absolute Neutrophils 3.7 2.0 - 7.5 10*9/L    Absolute Lymphocytes 0.5 (L) 1.5 - 5.0 10*9/L    Absolute Monocytes 0.1 (L) 0.2 - 0.8 10*9/L    Absolute Eosinophils 0.1 0.0 - 0.4 10*9/L    Absolute Basophils 0.0 0.0 - 0.1 10*9/L    Large Unstained Cells 1 0 - 4 %    Microcytosis Slight (A) Not Present    Anisocytosis Slight (A) Not Present    Hypochromasia Marked (A) Not Present   Morphology Review    Collection Time: 02/20/19  9:34 AM   Result Value Ref Range    Smear Review Comments See Comment (A) Undefined     Imaging:   No new images since admission.

## 2019-02-20 NOTE — Unmapped (Signed)
I spent 15 minutes with the patient at his clinic visit.  I reviewed and updated his medications.  He is still feeling short of breath when he exerts himself. BP is running low on amlodipine at 90's/60's. We will hold the amlodipine and he will let me know if his BP starts to go above 130's/80's.  His JP is putting out <20cc per day.  He is eating well and moving his bowels.  He will come back in 2 weeks to have his staples removed.  He took his tacrolimus at 9pm last night.

## 2019-02-27 ENCOUNTER — Ambulatory Visit: Admit: 2019-02-27 | Discharge: 2019-02-28 | Payer: BLUE CROSS/BLUE SHIELD

## 2019-03-04 DIAGNOSIS — Z940 Kidney transplant status: Principal | ICD-10-CM

## 2019-03-04 DIAGNOSIS — Z79899 Other long term (current) drug therapy: Principal | ICD-10-CM

## 2019-03-05 ENCOUNTER — Encounter: Admit: 2019-03-05 | Discharge: 2019-03-06 | Payer: BLUE CROSS/BLUE SHIELD

## 2019-03-06 ENCOUNTER — Encounter: Admit: 2019-03-06 | Discharge: 2019-03-07 | Payer: BLUE CROSS/BLUE SHIELD | Attending: Surgery | Primary: Surgery

## 2019-03-27 MED ORDER — MELATONIN 5 MG CHEWABLE TABLET
0 days
Start: 2019-03-27 — End: ?

## 2019-03-28 ENCOUNTER — Telehealth: Admit: 2019-03-28 | Discharge: 2019-03-28 | Payer: BLUE CROSS/BLUE SHIELD | Attending: Nephrology | Primary: Nephrology

## 2019-03-28 ENCOUNTER — Ambulatory Visit: Admit: 2019-03-28 | Discharge: 2019-03-28 | Payer: BLUE CROSS/BLUE SHIELD

## 2019-03-28 DIAGNOSIS — Z940 Kidney transplant status: Principal | ICD-10-CM

## 2019-03-28 MED ORDER — TACROLIMUS 1 MG CAPSULE
ORAL_CAPSULE | Freq: Two times a day (BID) | ORAL | 3 refills | 90 days | Status: CP
Start: 2019-03-28 — End: ?

## 2019-03-28 MED ORDER — ZALEPLON 5 MG CAPSULE
ORAL_CAPSULE | Freq: Every evening | ORAL | 3 refills | 30 days | Status: CP | PRN
Start: 2019-03-28 — End: ?

## 2019-04-01 DIAGNOSIS — Z940 Kidney transplant status: Principal | ICD-10-CM

## 2019-04-01 MED ORDER — TACROLIMUS 1 MG CAPSULE
ORAL_CAPSULE | Freq: Two times a day (BID) | ORAL | 3 refills | 90 days | Status: CP
Start: 2019-04-01 — End: ?

## 2019-04-03 ENCOUNTER — Encounter: Admit: 2019-04-03 | Discharge: 2019-04-04 | Payer: BLUE CROSS/BLUE SHIELD

## 2019-04-04 DIAGNOSIS — Z940 Kidney transplant status: Principal | ICD-10-CM

## 2019-04-18 ENCOUNTER — Encounter: Admit: 2019-04-18 | Discharge: 2019-04-19 | Payer: BLUE CROSS/BLUE SHIELD

## 2019-04-25 ENCOUNTER — Encounter: Admit: 2019-04-25 | Discharge: 2019-04-26 | Payer: BLUE CROSS/BLUE SHIELD

## 2019-05-16 ENCOUNTER — Encounter: Admit: 2019-05-16 | Discharge: 2019-05-16 | Payer: BLUE CROSS/BLUE SHIELD | Attending: Nephrology | Primary: Nephrology

## 2019-05-16 ENCOUNTER — Encounter: Admit: 2019-05-16 | Discharge: 2019-05-16 | Payer: BLUE CROSS/BLUE SHIELD

## 2019-05-16 DIAGNOSIS — Z940 Kidney transplant status: Principal | ICD-10-CM

## 2019-05-16 DIAGNOSIS — R21 Rash and other nonspecific skin eruption: Principal | ICD-10-CM

## 2019-05-16 MED ORDER — ZALEPLON 10 MG CAPSULE
ORAL_CAPSULE | Freq: Every evening | ORAL | 3 refills | 30.00000 days | Status: CP | PRN
Start: 2019-05-16 — End: ?

## 2019-06-17 ENCOUNTER — Non-Acute Institutional Stay: Admit: 2019-06-17 | Discharge: 2019-06-18 | Payer: BLUE CROSS/BLUE SHIELD

## 2019-07-31 DIAGNOSIS — Z79899 Other long term (current) drug therapy: Principal | ICD-10-CM

## 2019-07-31 DIAGNOSIS — Z114 Encounter for screening for human immunodeficiency virus [HIV]: Principal | ICD-10-CM

## 2019-07-31 DIAGNOSIS — Z1159 Encounter for screening for other viral diseases: Principal | ICD-10-CM

## 2019-07-31 DIAGNOSIS — Z940 Kidney transplant status: Principal | ICD-10-CM

## 2019-08-01 ENCOUNTER — Encounter: Admit: 2019-08-01 | Discharge: 2019-08-02 | Payer: BLUE CROSS/BLUE SHIELD | Attending: Nephrology | Primary: Nephrology

## 2019-08-01 ENCOUNTER — Encounter: Admit: 2019-08-01 | Discharge: 2019-08-02 | Payer: BLUE CROSS/BLUE SHIELD

## 2019-08-01 DIAGNOSIS — Z79899 Other long term (current) drug therapy: Principal | ICD-10-CM

## 2019-08-01 DIAGNOSIS — Z1159 Encounter for screening for other viral diseases: Principal | ICD-10-CM

## 2019-08-01 DIAGNOSIS — Z114 Encounter for screening for human immunodeficiency virus [HIV]: Principal | ICD-10-CM

## 2019-08-01 DIAGNOSIS — Z940 Kidney transplant status: Principal | ICD-10-CM

## 2019-08-01 MED ORDER — TRAZODONE 50 MG TABLET
ORAL_TABLET | Freq: Every evening | ORAL | 3 refills | 90.00000 days | Status: CP
Start: 2019-08-01 — End: 2019-08-31

## 2019-08-08 DIAGNOSIS — Z940 Kidney transplant status: Principal | ICD-10-CM

## 2019-08-08 MED ORDER — MYCOPHENOLATE SODIUM 180 MG TABLET,DELAYED RELEASE
ORAL_TABLET | 11 refills | 0 days | Status: CP
Start: 2019-08-08 — End: ?

## 2019-08-17 IMAGING — CR DG ELBOW COMPLETE 3+V*R*
4 series · 4 of 4 positions shown · non-contrast
Comparison: None.

CLINICAL DATA: Pain and swelling of the right elbow for 2 weeks.

EXAM:
RIGHT ELBOW - COMPLETE 3+ VIEW

[elbow ap]
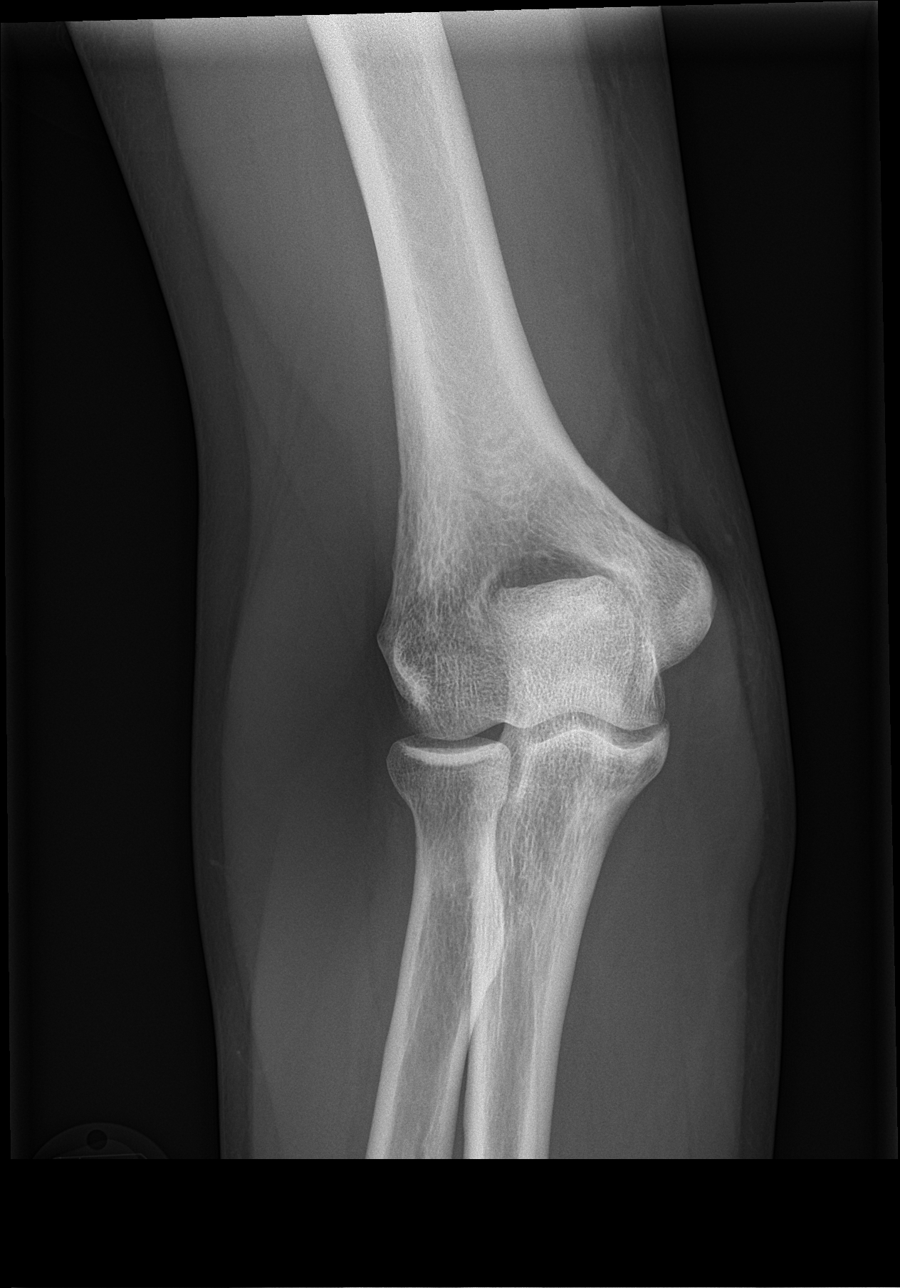

[elbow obl (1 of 2)]
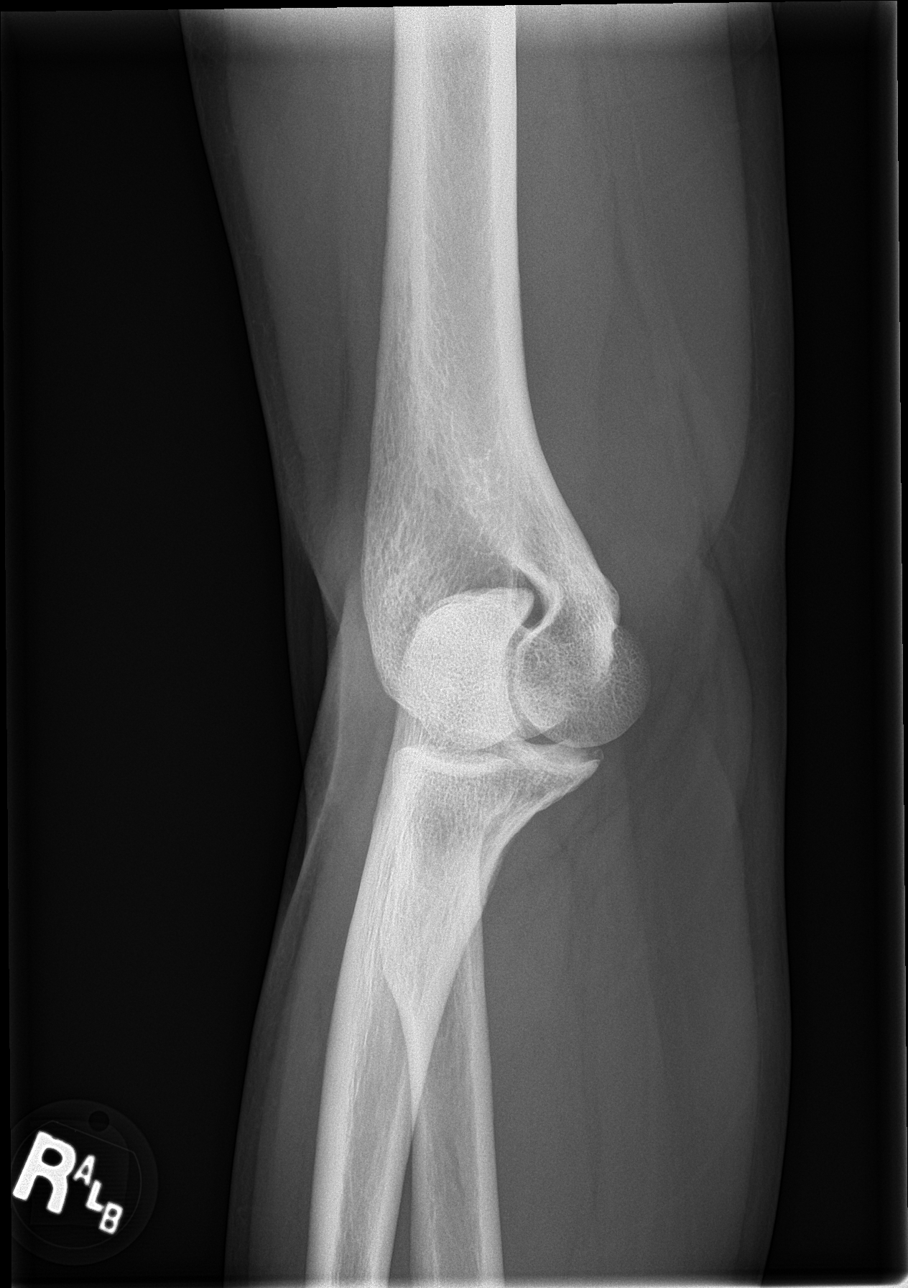

[elbow obl (2 of 2)]
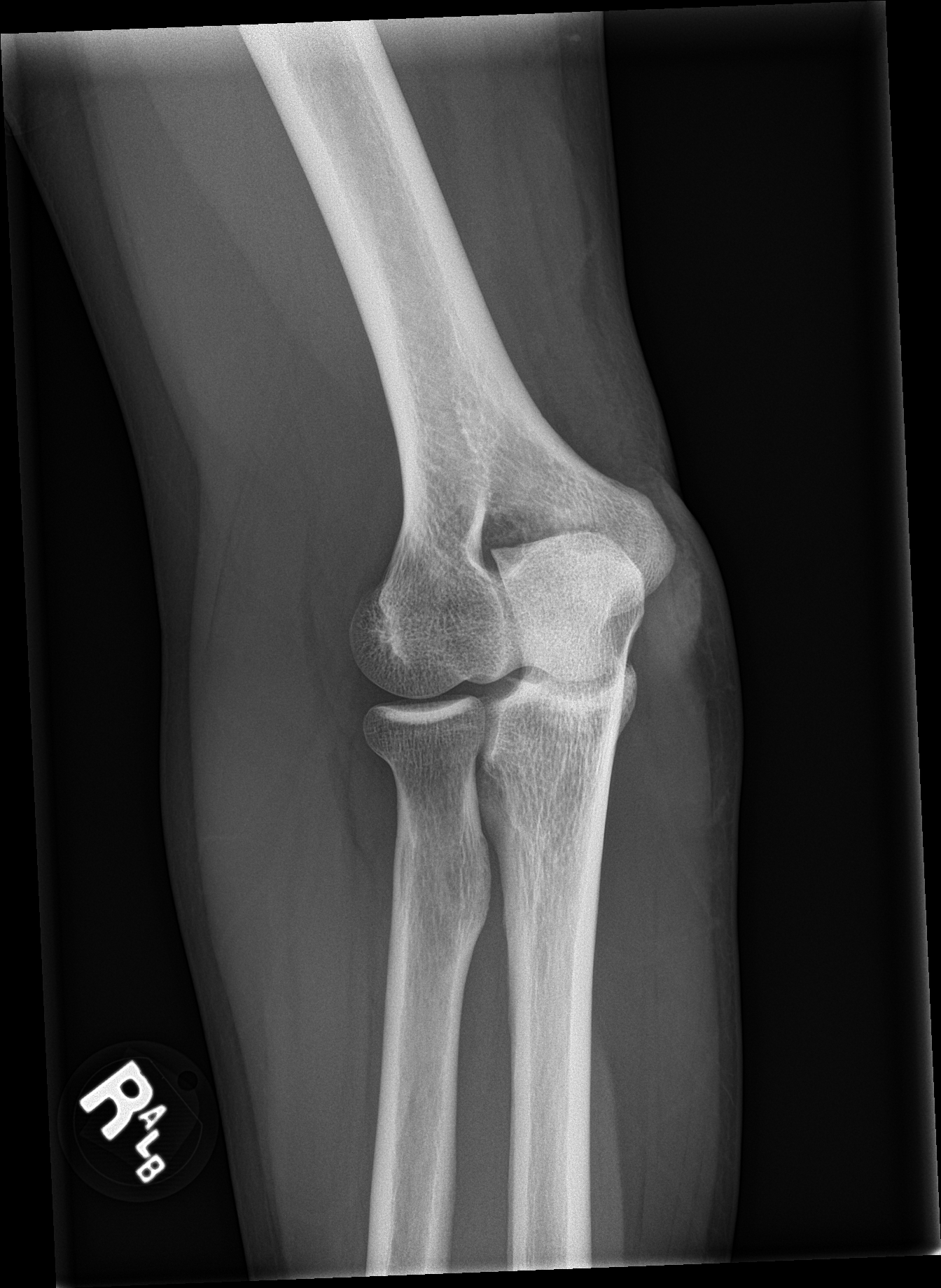

[elbow lat]
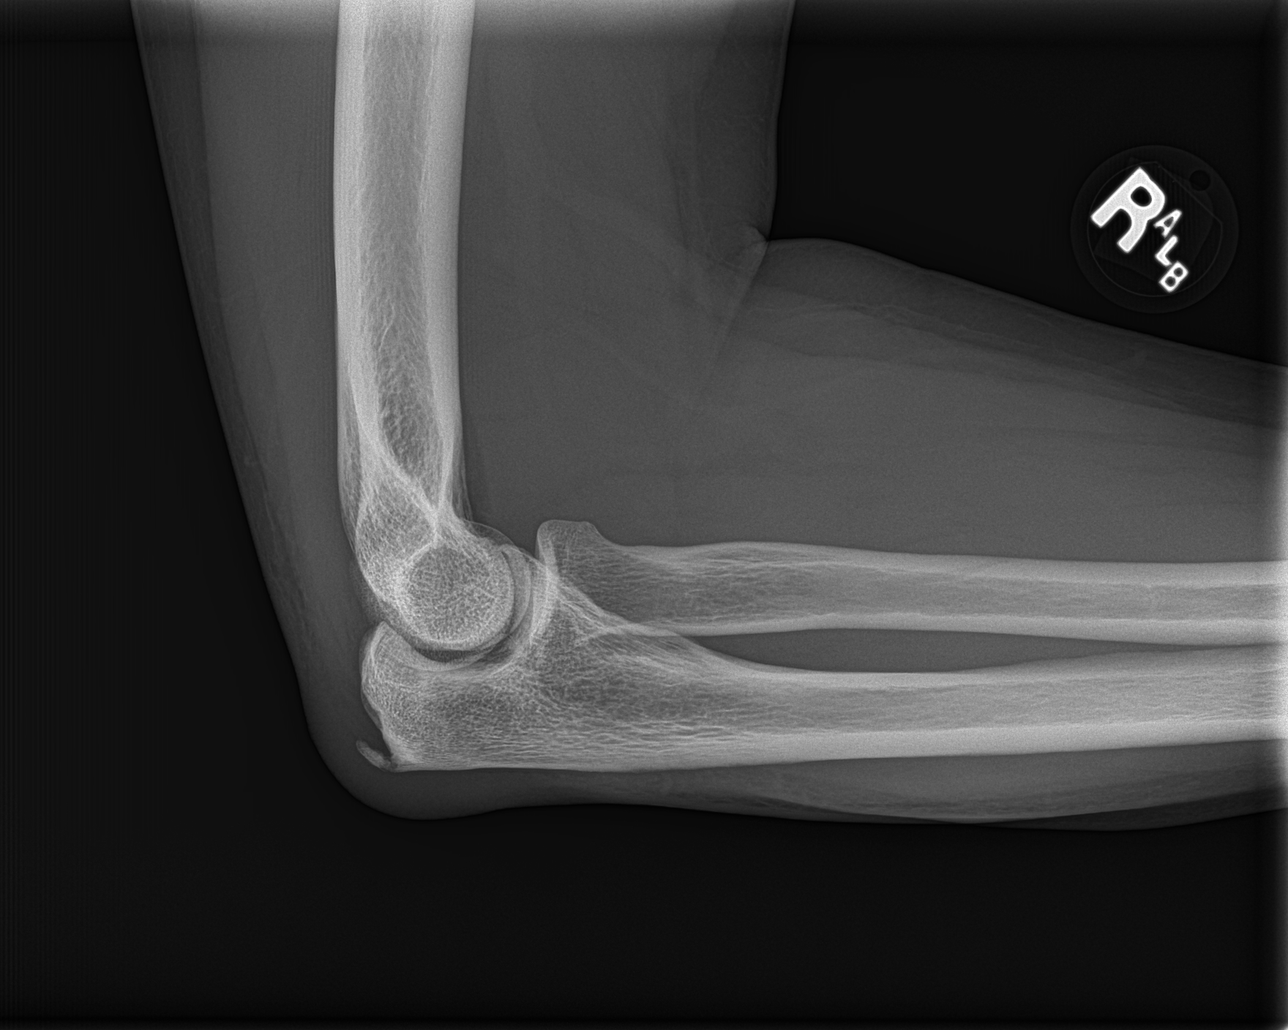

[4 of 4 positions shown; findings below may reference images not displayed]

FINDINGS: The joint spaces are maintained. No degenerative changes or
osteochondral abnormality. No elbow joint effusion. There is a small
olecranon spur and some surrounding soft tissue swelling/edema.
Possible olecranon bursitis.
IMPRESSION: No acute bony findings, degenerative changes or joint effusion.

Small olecranon spur and possible olecranon bursitis.

## 2019-09-05 ENCOUNTER — Encounter: Admit: 2019-09-05 | Discharge: 2019-09-06 | Payer: BLUE CROSS/BLUE SHIELD

## 2019-09-21 DIAGNOSIS — L819 Disorder of pigmentation, unspecified: Principal | ICD-10-CM

## 2019-09-21 DIAGNOSIS — Z940 Kidney transplant status: Principal | ICD-10-CM

## 2019-09-21 DIAGNOSIS — Z79899 Other long term (current) drug therapy: Principal | ICD-10-CM

## 2019-10-08 DIAGNOSIS — I10 Essential (primary) hypertension: Principal | ICD-10-CM

## 2019-10-08 MED ORDER — AMLODIPINE 10 MG TABLET
ORAL_TABLET | Freq: Every day | ORAL | 4 refills | 90.00000 days
Start: 2019-10-08 — End: ?

## 2019-10-15 MED ORDER — AMLODIPINE 5 MG TABLET
ORAL_TABLET | Freq: Every day | ORAL | 11 refills | 30.00000 days | Status: CP
Start: 2019-10-15 — End: 2020-10-14

## 2019-10-15 MED ORDER — LOSARTAN 25 MG TABLET
ORAL_TABLET | Freq: Every day | ORAL | 11 refills | 30 days | Status: CP
Start: 2019-10-15 — End: 2020-10-14

## 2019-10-28 DIAGNOSIS — Z940 Kidney transplant status: Principal | ICD-10-CM

## 2019-10-28 DIAGNOSIS — Z79899 Other long term (current) drug therapy: Principal | ICD-10-CM

## 2019-10-29 ENCOUNTER — Non-Acute Institutional Stay: Admit: 2019-10-29 | Discharge: 2019-10-30 | Payer: BLUE CROSS/BLUE SHIELD

## 2019-10-31 ENCOUNTER — Ambulatory Visit: Admit: 2019-10-31 | Discharge: 2019-10-31 | Payer: BLUE CROSS/BLUE SHIELD

## 2019-10-31 ENCOUNTER — Encounter: Admit: 2019-10-31 | Discharge: 2019-10-31 | Payer: BLUE CROSS/BLUE SHIELD

## 2019-10-31 ENCOUNTER — Ambulatory Visit: Admit: 2019-10-31 | Discharge: 2019-10-31 | Payer: BLUE CROSS/BLUE SHIELD | Attending: Nephrology | Primary: Nephrology

## 2019-10-31 MED ORDER — DOXEPIN 3 MG TABLET
ORAL_TABLET | Freq: Every evening | ORAL | 0 refills | 30.00000 days | Status: CP
Start: 2019-10-31 — End: 2019-11-30

## 2019-10-31 MED ORDER — LOSARTAN 25 MG TABLET
ORAL_TABLET | Freq: Two times a day (BID) | ORAL | 11 refills | 30 days | Status: CP
Start: 2019-10-31 — End: 2020-10-30

## 2019-10-31 MED ORDER — ESCITALOPRAM 10 MG TABLET
ORAL_TABLET | Freq: Every day | ORAL | 2 refills | 30 days | Status: CP
Start: 2019-10-31 — End: 2020-10-30

## 2019-11-23 MED ORDER — ESCITALOPRAM 10 MG TABLET
ORAL_TABLET | 1 refills | 0 days
Start: 2019-11-23 — End: ?

## 2019-11-27 ENCOUNTER — Other Ambulatory Visit: Payer: Self-pay

## 2019-11-27 ENCOUNTER — Encounter: Payer: Self-pay | Admitting: Unknown Physician Specialty

## 2019-11-27 ENCOUNTER — Ambulatory Visit (INDEPENDENT_AMBULATORY_CARE_PROVIDER_SITE_OTHER): Payer: PRIVATE HEALTH INSURANCE | Admitting: Unknown Physician Specialty

## 2019-11-27 VITALS — BP 154/87 | HR 68 | Temp 97.6°F | Ht 70.5 in | Wt 246.2 lb

## 2019-11-27 DIAGNOSIS — Z940 Kidney transplant status: Secondary | ICD-10-CM | POA: Diagnosis not present

## 2019-11-27 DIAGNOSIS — Z0000 Encounter for general adult medical examination without abnormal findings: Secondary | ICD-10-CM

## 2019-11-27 MED ORDER — DOXEPIN 3 MG TABLET
ORAL_TABLET | Freq: Every evening | ORAL | 0 refills | 30.00000 days | Status: CP
Start: 2019-11-27 — End: 2020-01-02

## 2019-11-27 NOTE — Assessment & Plan Note (Signed)
Followed by transplant team

## 2019-11-27 NOTE — Progress Notes (Signed)
BP (!) 154/87   Pulse 68   Temp 97.6 F (36.4 C) (Oral)   Ht 5' 10.5" (1.791 m)   Wt 246 lb 3.2 oz (111.7 kg)   SpO2 98%   BMI 34.83 kg/m    Subjective:    Patient ID: Scott Hickman, male    DOB: 1975-06-02, 44 y.o.   MRN: [id number]  HPI: Scott Hickman is a 44 y.o. male   Pt states a physical is required for work.  He gets regular labs and f/u with kidney transplant team. Frequent CBC and blood chemistry tests done there.  No concerns today   Chief Complaint  Patient presents with  . Annual Exam   Past Medical History:  Diagnosis Date  . Chronic kidney disease, stage V ([place]) 01/04/2015  . GERD (gastroesophageal reflux disease)    RARE-NO MEDS  . Hypertension   . 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 Kidney disease Father    Social History   Socioeconomic History  . Marital status: Single    Spouse name: Not on file  . Number of children: Not on file  . Years of education: Not on file  . Highest education level: Not on file  Occupational History  . Not on file  Tobacco Use  . Smoking status: Former Smoker    Packs/day: 0.50    Years: 2.00    Pack years: 1.00    Types: Cigarettes    Quit date: 03/28/1998    Years since quitting: 21.6  . Smokeless tobacco: Former Network engineer  . Vaping Use: Never used  Substance and Sexual Activity  . Alcohol use: Yes    Alcohol/week: 0.0 standard drinks    Comment: RARE  . Drug use: No  . Sexual activity: Yes  Other Topics Concern  . Not on file  Social History Narrative  . Not on file   Social Determinants of Health   Financial Resource Strain:   . Difficulty of Paying Living Expenses: Not on file  Food Insecurity:   . Worried About Charity fundraiser in the Last Year: Not on file  . Ran Out of Food in the Last Year: Not on file  Transportation Needs:   . Lack of Transportation (Medical): Not on file  . Lack of Transportation  (Non-Medical): Not on file  Physical Activity:   . Days of Exercise per Week: Not on file  . Minutes of Exercise per Session: Not on file  Stress:   . Feeling of Stress : Not on file  Social Connections:   . Frequency of Communication with Friends and Family: Not on file  . Frequency of Social Gatherings with Friends and Family: Not on file  . Attends Religious Services: Not on file  . Active Member of Clubs or Organizations: Not on file  . Attends Archivist Meetings: Not on file  . Marital Status: Not on file  Intimate Partner Violence:   . Fear of Current or Ex-Partner: Not on file  . Emotionally Abused: Not on file  . Physically Abused: Not on file  . Sexually Abused: Not on file   Past Surgical History:  Procedure Laterality Date  . APPENDECTOMY    . AV FISTULA PLACEMENT  01/2018  . AV FISTULA PLACEMENT, BRACHIOCEPHALIC    . KIDNEY TRANSPLANT    . OLECRANON BURSECTOMY Right 04/01/2018   Procedure: EXCISION, OLECRANON BURSA  RIGHT;  Surgeon: Earnestine Leys, MD;  Location: [facility] ORS;  Service: Orthopedics;  Laterality: Right;  . SMALL INTESTINE SURGERY      Relevant past medical, surgical, family and social history reviewed and updated as indicated. Interim medical history since our last visit reviewed. Allergies and medications reviewed and updated.  Review of Systems  All other systems reviewed and are negative.   Per HPI unless specifically indicated above     Objective:    BP (!) 154/87   Pulse 68   Temp 97.6 F (36.4 C) (Oral)   Ht 5' 10.5" (1.791 m)   Wt 246 lb 3.2 oz (111.7 kg)   SpO2 98%   BMI 34.83 kg/m   Wt Readings from Last 3 Encounters:  11/27/19 246 lb 3.2 oz (111.7 kg)  11/21/18 242 lb (109.8 kg)  04/01/18 251 lb (113.9 kg)    Physical Exam Constitutional:      General: He is not in acute distress.    Appearance: Normal appearance. He is well-developed.  HENT:     Head: Normocephalic and atraumatic.  Eyes:     General: Lids are  normal. No scleral icterus.       Right eye: No discharge.        Left eye: No discharge.     Conjunctiva/sclera: Conjunctivae normal.  Neck:     Vascular: No carotid bruit or JVD.  Cardiovascular:     Rate and Rhythm: Normal rate and regular rhythm.     Heart sounds: Normal heart sounds.  Pulmonary:     Effort: Pulmonary effort is normal. No respiratory distress.     Breath sounds: Normal breath sounds.  Abdominal:     General: Abdomen is flat.     Palpations: There is no hepatomegaly or splenomegaly.     Comments: Scarring and transplanted kidney lower left  Musculoskeletal:        General: Normal range of motion.     Cervical back: Normal range of motion and neck supple.  Skin:    General: Skin is warm and dry.     Coloration: Skin is not pale.     Findings: No rash.  Neurological:     Mental Status: He is alert and oriented to person, place, and time.  Psychiatric:        Behavior: Behavior normal.        Thought Content: Thought content normal.        Judgment: Judgment normal.     Results for orders placed or performed in visit on 11/21/18  TSH  Result Value Ref Range   TSH 1.030 0.450 - 4.500 uIU/mL  PSA  Result Value Ref Range   Prostate Specific Ag, Serum 1.9 0.0 - 4.0 ng/mL  Lipid Panel w/o Chol/HDL Ratio  Result Value Ref Range   Cholesterol, Total 153 100 - 199 mg/dL   Triglycerides 94 0 - 149 mg/dL   HDL 43 >39 mg/dL   VLDL Cholesterol Cal 18 5 - 40 mg/dL   LDL Chol Calc (NIH) 92 0 - 99 mg/dL      Assessment & Plan:   Problem List Items Addressed This Visit      Unprioritized   Kidney transplant status, living related donor    Followed by transplant team       Other Visit Diagnoses    Routine general medical examination at a health care facility    -  Primary   check cholesterol and TSH.  Other labs done  through Nephrology   Relevant Orders   TSH   Lipid Panel w/o Chol/HDL Ratio      Pt had his Covid vaccines.  Planning on booster.     Received flu shot.   Received Shingrx Discuss colon cancer screening next year.    Follow up plan: Return in about 1 year (around 11/26/2020).

## 2019-11-28 LAB — LIPID PANEL W/O CHOL/HDL RATIO
Cholesterol, Total: 137 mg/dL (ref 100–199)
HDL: 43 mg/dL (ref >39)
LDL Chol Calc (NIH): 73 mg/dL (ref 0–99)
Triglycerides: 113 mg/dL (ref 0–149)
VLDL Cholesterol Cal: 21 mg/dL (ref 5–40)

## 2019-11-28 LAB — TSH: TSH: 1.27 u[IU]/mL (ref 0.450–4.500)

## 2019-12-03 DIAGNOSIS — Z940 Kidney transplant status: Principal | ICD-10-CM

## 2019-12-03 DIAGNOSIS — Z79899 Other long term (current) drug therapy: Principal | ICD-10-CM

## 2019-12-03 MED ORDER — LOSARTAN 50 MG TABLET
ORAL_TABLET | Freq: Two times a day (BID) | ORAL | 3 refills | 90.00000 days | Status: CP
Start: 2019-12-03 — End: 2020-12-02

## 2019-12-26 ENCOUNTER — Non-Acute Institutional Stay: Admit: 2019-12-26 | Discharge: 2019-12-27 | Payer: BLUE CROSS/BLUE SHIELD

## 2019-12-26 DIAGNOSIS — Z940 Kidney transplant status: Principal | ICD-10-CM

## 2019-12-26 DIAGNOSIS — Z79899 Other long term (current) drug therapy: Principal | ICD-10-CM

## 2019-12-29 DIAGNOSIS — Z940 Kidney transplant status: Principal | ICD-10-CM

## 2019-12-29 DIAGNOSIS — N528 Other male erectile dysfunction: Principal | ICD-10-CM

## 2019-12-30 DIAGNOSIS — Z940 Kidney transplant status: Principal | ICD-10-CM

## 2019-12-30 MED ORDER — TACROLIMUS 0.5 MG CAPSULE, IMMEDIATE-RELEASE: capsule | 3 refills | 0 days | Status: AC

## 2019-12-30 MED ORDER — TACROLIMUS 1 MG CAPSULE, IMMEDIATE-RELEASE
ORAL_CAPSULE | ORAL | 3 refills | 0.00000 days | Status: CP
Start: 2019-12-30 — End: 2019-12-30

## 2019-12-30 MED ORDER — TACROLIMUS 1 MG CAPSULE, IMMEDIATE-RELEASE: capsule | 3 refills | 0 days | Status: AC

## 2019-12-30 MED ORDER — TACROLIMUS 0.5 MG CAPSULE, IMMEDIATE-RELEASE
ORAL_CAPSULE | ORAL | 3 refills | 0.00000 days | Status: CP
Start: 2019-12-30 — End: 2019-12-30

## 2020-01-02 MED ORDER — DOXEPIN 3 MG TABLET
ORAL_TABLET | Freq: Every evening | ORAL | 3 refills | 0.00000 days | Status: CP
Start: 2020-01-02 — End: 2020-02-01

## 2020-01-02 MED ORDER — TADALAFIL 5 MG TABLET
ORAL_TABLET | Freq: Every day | ORAL | 3 refills | 30 days | Status: CP | PRN
Start: 2020-01-02 — End: 2020-02-01

## 2020-01-29 MED ORDER — ESCITALOPRAM 10 MG TABLET
ORAL_TABLET | Freq: Every day | ORAL | 11 refills | 30.00000 days | Status: CP
Start: 2020-01-29 — End: 2021-01-28

## 2020-02-05 ENCOUNTER — Encounter
Admit: 2020-02-05 | Discharge: 2020-02-06 | Payer: BLUE CROSS/BLUE SHIELD | Attending: Dermatology | Primary: Dermatology

## 2020-02-05 DIAGNOSIS — L219 Seborrheic dermatitis, unspecified: Principal | ICD-10-CM

## 2020-02-05 DIAGNOSIS — L730 Acne keloid: Principal | ICD-10-CM

## 2020-02-05 DIAGNOSIS — L738 Other specified follicular disorders: Principal | ICD-10-CM

## 2020-02-05 DIAGNOSIS — Z940 Kidney transplant status: Principal | ICD-10-CM

## 2020-02-05 DIAGNOSIS — L739 Follicular disorder, unspecified: Principal | ICD-10-CM

## 2020-02-05 DIAGNOSIS — Z79899 Other long term (current) drug therapy: Principal | ICD-10-CM

## 2020-02-05 DIAGNOSIS — D229 Melanocytic nevi, unspecified: Principal | ICD-10-CM

## 2020-02-05 DIAGNOSIS — L578 Other skin changes due to chronic exposure to nonionizing radiation: Principal | ICD-10-CM

## 2020-02-05 DIAGNOSIS — L814 Other melanin hyperpigmentation: Principal | ICD-10-CM

## 2020-02-05 DIAGNOSIS — D1801 Hemangioma of skin and subcutaneous tissue: Principal | ICD-10-CM

## 2020-02-05 DIAGNOSIS — L821 Other seborrheic keratosis: Principal | ICD-10-CM

## 2020-02-05 MED ORDER — KETOCONAZOLE 2 % SHAMPOO
TOPICAL | 11 refills | 0.00000 days | Status: CP
Start: 2020-02-05 — End: 2020-02-05

## 2020-02-05 MED ORDER — CLINDAMYCIN PHOSPHATE 1 % TOPICAL SOLUTION
Freq: Every morning | TOPICAL | 11 refills | 0.00000 days | Status: CP
Start: 2020-02-05 — End: 2021-02-04

## 2020-02-06 ENCOUNTER — Ambulatory Visit: Admit: 2020-02-06 | Discharge: 2020-02-06 | Payer: BLUE CROSS/BLUE SHIELD

## 2020-02-06 ENCOUNTER — Encounter: Admit: 2020-02-06 | Discharge: 2020-02-06 | Payer: BLUE CROSS/BLUE SHIELD | Attending: Nephrology | Primary: Nephrology

## 2020-02-06 DIAGNOSIS — D849 Immunodeficiency, unspecified: Principal | ICD-10-CM

## 2020-02-06 DIAGNOSIS — Q612 Polycystic kidney, adult type: Principal | ICD-10-CM

## 2020-02-06 DIAGNOSIS — I1311 Hypertensive heart and chronic kidney disease without heart failure, with stage 5 chronic kidney disease, or end stage renal disease: Principal | ICD-10-CM

## 2020-02-06 DIAGNOSIS — D649 Anemia, unspecified: Principal | ICD-10-CM

## 2020-02-06 DIAGNOSIS — F419 Anxiety disorder, unspecified: Principal | ICD-10-CM

## 2020-02-06 DIAGNOSIS — Z880 Allergy status to penicillin: Principal | ICD-10-CM

## 2020-02-06 DIAGNOSIS — Z940 Kidney transplant status: Principal | ICD-10-CM

## 2020-02-06 DIAGNOSIS — Z20822 Contact with and (suspected) exposure to covid-19: Principal | ICD-10-CM

## 2020-02-06 DIAGNOSIS — Z09 Encounter for follow-up examination after completed treatment for conditions other than malignant neoplasm: Principal | ICD-10-CM

## 2020-02-06 DIAGNOSIS — Z79899 Other long term (current) drug therapy: Principal | ICD-10-CM

## 2020-02-06 DIAGNOSIS — Z23 Encounter for immunization: Principal | ICD-10-CM

## 2020-02-06 DIAGNOSIS — G4700 Insomnia, unspecified: Principal | ICD-10-CM

## 2020-02-06 DIAGNOSIS — I10 Essential (primary) hypertension: Principal | ICD-10-CM

## 2020-02-06 DIAGNOSIS — N185 Chronic kidney disease, stage 5: Principal | ICD-10-CM

## 2020-02-06 MED ORDER — ESCITALOPRAM 10 MG TABLET
ORAL_TABLET | Freq: Every day | ORAL | 3 refills | 90 days | Status: CP
Start: 2020-02-06 — End: 2021-02-05

## 2020-02-06 MED ORDER — DOXEPIN 6 MG TABLET
0 refills | 0.00000 days
Start: 2020-02-06 — End: ?

## 2020-02-06 MED ORDER — DOXEPIN 3 MG TABLET
ORAL_TABLET | Freq: Every evening | ORAL | 3 refills | 0 days | Status: CP
Start: 2020-02-06 — End: 2020-03-07

## 2020-02-06 MED ORDER — MYCOPHENOLATE SODIUM 180 MG TABLET,DELAYED RELEASE
ORAL_TABLET | Freq: Two times a day (BID) | ORAL | 11 refills | 30.00000 days | Status: CP
Start: 2020-02-06 — End: ?

## 2020-02-06 MED ORDER — AMLODIPINE 2.5 MG TABLET
ORAL_TABLET | Freq: Every evening | ORAL | 11 refills | 30 days | Status: CP
Start: 2020-02-06 — End: 2021-02-05

## 2020-02-12 ENCOUNTER — Encounter: Admit: 2020-02-12 | Discharge: 2020-02-13 | Payer: BLUE CROSS/BLUE SHIELD

## 2020-02-12 DIAGNOSIS — Z79899 Other long term (current) drug therapy: Principal | ICD-10-CM

## 2020-02-12 DIAGNOSIS — Z940 Kidney transplant status: Principal | ICD-10-CM

## 2020-03-08 LAB — HM DIABETES EYE EXAM

## 2020-03-30 MED ORDER — DOXEPIN 3 MG TABLET
Freq: Every evening | ORAL | 0 days
Start: 2020-03-30 — End: 2020-05-21

## 2020-04-05 ENCOUNTER — Encounter: Admit: 2020-04-05 | Discharge: 2020-04-06 | Payer: BLUE CROSS/BLUE SHIELD

## 2020-04-05 DIAGNOSIS — Z79899 Other long term (current) drug therapy: Principal | ICD-10-CM

## 2020-04-05 DIAGNOSIS — Z940 Kidney transplant status: Principal | ICD-10-CM

## 2020-04-16 ENCOUNTER — Other Ambulatory Visit: Payer: Self-pay

## 2020-04-16 ENCOUNTER — Encounter (INDEPENDENT_AMBULATORY_CARE_PROVIDER_SITE_OTHER): Payer: PRIVATE HEALTH INSURANCE | Admitting: Ophthalmology

## 2020-04-16 DIAGNOSIS — I10 Essential (primary) hypertension: Secondary | ICD-10-CM | POA: Diagnosis not present

## 2020-04-16 DIAGNOSIS — H33302 Unspecified retinal break, left eye: Secondary | ICD-10-CM

## 2020-04-16 DIAGNOSIS — H2512 Age-related nuclear cataract, left eye: Secondary | ICD-10-CM

## 2020-04-16 DIAGNOSIS — H35033 Hypertensive retinopathy, bilateral: Secondary | ICD-10-CM

## 2020-04-16 DIAGNOSIS — H43813 Vitreous degeneration, bilateral: Secondary | ICD-10-CM

## 2020-04-19 ENCOUNTER — Ambulatory Visit: Admit: 2020-04-19 | Discharge: 2020-04-20 | Payer: BLUE CROSS/BLUE SHIELD

## 2020-04-19 MED ORDER — TADALAFIL 20 MG TABLET
ORAL_TABLET | 11 refills | 0 days | Status: CP
Start: 2020-04-19 — End: ?

## 2020-04-21 MED ORDER — AMLODIPINE 2.5 MG TABLET
ORAL_TABLET | ORAL | 11 refills | 0.00000 days | Status: CP
Start: 2020-04-21 — End: ?

## 2020-04-23 ENCOUNTER — Encounter (INDEPENDENT_AMBULATORY_CARE_PROVIDER_SITE_OTHER): Payer: PRIVATE HEALTH INSURANCE | Admitting: Ophthalmology

## 2020-04-23 ENCOUNTER — Other Ambulatory Visit: Payer: Self-pay

## 2020-04-23 DIAGNOSIS — H33302 Unspecified retinal break, left eye: Secondary | ICD-10-CM

## 2020-04-23 HISTORY — PX: EYE SURGERY: SHX253

## 2020-04-30 MED ORDER — TADALAFIL 5 MG TABLET
ORAL_TABLET | ORAL | 3 refills | 0.00000 days | Status: CP
Start: 2020-04-30 — End: ?

## 2020-05-18 DIAGNOSIS — Z79899 Other long term (current) drug therapy: Principal | ICD-10-CM

## 2020-05-18 DIAGNOSIS — Z940 Kidney transplant status: Principal | ICD-10-CM

## 2020-05-21 ENCOUNTER — Encounter: Admit: 2020-05-21 | Discharge: 2020-05-22 | Payer: BLUE CROSS/BLUE SHIELD

## 2020-05-21 ENCOUNTER — Ambulatory Visit: Admit: 2020-05-21 | Discharge: 2020-05-22 | Payer: BLUE CROSS/BLUE SHIELD | Attending: Nephrology | Primary: Nephrology

## 2020-05-21 DIAGNOSIS — D849 Immunodeficiency, unspecified: Principal | ICD-10-CM

## 2020-05-21 DIAGNOSIS — Z940 Kidney transplant status: Principal | ICD-10-CM

## 2020-05-21 DIAGNOSIS — Z79899 Other long term (current) drug therapy: Principal | ICD-10-CM

## 2020-05-21 MED ORDER — TACROLIMUS 1 MG CAPSULE, IMMEDIATE-RELEASE
ORAL_CAPSULE | ORAL | 3 refills | 0.00000 days | Status: CP
Start: 2020-05-21 — End: ?

## 2020-05-21 MED ORDER — DOXEPIN 6 MG TABLET
ORAL_TABLET | Freq: Every evening | ORAL | 3 refills | 90 days | Status: CP
Start: 2020-05-21 — End: ?

## 2020-05-21 MED ORDER — TACROLIMUS 0.5 MG CAPSULE, IMMEDIATE-RELEASE
ORAL_CAPSULE | ORAL | 3 refills | 0.00000 days | Status: CP
Start: 2020-05-21 — End: ?

## 2020-05-27 DIAGNOSIS — D849 Immunodeficiency, unspecified: Principal | ICD-10-CM

## 2020-05-27 DIAGNOSIS — Z940 Kidney transplant status: Principal | ICD-10-CM

## 2020-05-27 MED ORDER — TACROLIMUS 0.5 MG CAPSULE, IMMEDIATE-RELEASE
ORAL_CAPSULE | Freq: Two times a day (BID) | ORAL | 3 refills | 90.00000 days | Status: CP
Start: 2020-05-27 — End: 2020-05-27

## 2020-05-27 MED ORDER — DOXEPIN 6 MG TABLET
ORAL_TABLET | Freq: Every evening | ORAL | 3 refills | 90.00000 days | Status: CP
Start: 2020-05-27 — End: ?

## 2020-07-05 ENCOUNTER — Other Ambulatory Visit: Payer: Self-pay | Admitting: Orthopedic Surgery

## 2020-07-14 ENCOUNTER — Other Ambulatory Visit: Admission: RE | Admit: 2020-07-14 | Payer: PRIVATE HEALTH INSURANCE | Source: Ambulatory Visit

## 2020-07-15 ENCOUNTER — Other Ambulatory Visit: Payer: Self-pay

## 2020-07-15 ENCOUNTER — Other Ambulatory Visit
Admission: RE | Admit: 2020-07-15 | Discharge: 2020-07-15 | Disposition: A | Discharge status: Home / Self Care | Payer: No Typology Code available for payment source | Source: Ambulatory Visit | Attending: Orthopedic Surgery | Admitting: Orthopedic Surgery

## 2020-07-15 HISTORY — DX: Anxiety disorder, unspecified: F41.9

## 2020-07-15 HISTORY — DX: Anemia, unspecified: D64.9

## 2020-07-15 HISTORY — DX: Male erectile dysfunction, unspecified: N52.9

## 2020-07-15 HISTORY — DX: Sleep deprivation: Z72.820

## 2020-07-15 HISTORY — DX: Polycystic kidney, adult type: Q61.2

## 2020-07-15 HISTORY — DX: Immunodeficiency, unspecified: D84.9

## 2020-07-15 HISTORY — DX: Personal history of urinary calculi: Z87.442

## 2020-07-15 NOTE — Patient Instructions (Signed)
Your procedure is scheduled on: Thursday July 22, 2020. Report to Day Surgery inside [facility] 2nd floor (Stop by admissions desk first before getting on elevator) To find out your arrival time please call [phone] between 1PM - 3PM on Wednesday July 21, 2020.  Remember: Instructions that are not followed completely may result in serious medical risk,  up to and including death, or upon the discretion of your surgeon and anesthesiologist your  surgery may need to be rescheduled.     _X__ 1. Do not eat food or drink fluids after midnight the night before your procedure.                 No chewing gum or hard candies.   __X__2.  On the morning of surgery brush your teeth with toothpaste and water, you                may rinse your mouth with mouthwash if you wish.  Do not swallow any toothpaste of mouthwash.     _X__ 3.  No Alcohol for 24 hours before or after surgery.   _X__ 4.  Do Not Smoke or use e-cigarettes For 24 Hours Prior to Your Surgery.                 Do not use any chewable tobacco products for at least 6 hours prior to                 Surgery.  _X__  5.  Do not use any recreational drugs (marijuana, cocaine, heroin, ecstasy, MDMA or other)                For at least one week prior to your surgery.  Combination of these drugs with anesthesia                May have life threatening results.  __X__6.  Notify your doctor if there is any change in your medical condition      (cold, fever, infections).     Do not wear jewelry, make-up, hairpins, clips or nail polish. Do not wear lotions, powders, or perfumes. You may wear deodorant. Do not shave 48 hours prior to surgery. Men may shave face and neck. Do not bring valuables to the hospital.    [facility] is not responsible for any belongings or valuables.  Contacts, dentures or bridgework may not be worn into surgery. Leave your suitcase in the car. After surgery it may be brought to your  room. For patients admitted to the hospital, discharge time is determined by your treatment team.   Patients discharged the day of surgery will not be allowed to drive home.   Make arrangements for someone to be with you for the first 24 hours of your Same Day Discharge.   __X__ Take these medicines the morning of surgery with A SIP OF WATER:    1. amLODipine (NORVASC) 2.5 MG  2. escitalopram (LEXAPRO) 10 MG   3. mycophenolate (MYFORTIC) 180 MG   4. tacrolimus (PROGRAF) 0.5 MG  5.  6.  ____ Fleet Enema (as directed)   __X__ Use CHG Soap (or wipes) as directed  ____ Use Benzoyl Peroxide Gel as instructed  ____ Use inhalers on the day of surgery  ____ Stop metformin 2 days prior to surgery    ____ Take 1/2 of usual insulin dose the night before surgery. No insulin the morning          of surgery.  ____ Call your PCP, cardiologist, or Pulmonologist if taking Coumadin/Plavix/aspirin and ask when to stop before your surgery.   __X__ One Week prior to surgery- Stop Anti-inflammatories such as Ibuprofen, Aleve, Advil, Motrin, meloxicam (MOBIC), diclofenac, etodolac, ketorolac, Toradol, Daypro, piroxicam, Goody's or BC powders. OK TO USE TYLENOL IF NEEDED   __X__ Stop supplements until after surgery.    ____ Bring C-Pap to the hospital.    If you have any questions regarding your pre-procedure instructions,  Please call Pre-admit Testing at [phone].

## 2020-07-16 ENCOUNTER — Encounter: Payer: Self-pay | Admitting: Urgent Care

## 2020-07-16 ENCOUNTER — Other Ambulatory Visit
Admission: RE | Admit: 2020-07-16 | Discharge: 2020-07-16 | Disposition: A | Discharge status: Home / Self Care | Payer: No Typology Code available for payment source | Source: Ambulatory Visit | Attending: Orthopedic Surgery | Admitting: Orthopedic Surgery

## 2020-07-16 DIAGNOSIS — I10 Essential (primary) hypertension: Secondary | ICD-10-CM | POA: Diagnosis not present

## 2020-07-16 DIAGNOSIS — Z01818 Encounter for other preprocedural examination: Secondary | ICD-10-CM | POA: Diagnosis not present

## 2020-07-16 DIAGNOSIS — Z01810 Encounter for preprocedural cardiovascular examination: Secondary | ICD-10-CM | POA: Diagnosis not present

## 2020-07-16 LAB — CBC WITH DIFFERENTIAL/PLATELET
Abs Immature Granulocytes: 0.03 10*3/uL (ref 0.00–0.07)
Basophils Absolute: 0 10*3/uL (ref 0.0–0.1)
Basophils Relative: 0 %
Eosinophils Absolute: 0.1 10*3/uL (ref 0.0–0.5)
Eosinophils Relative: 2 %
HCT: 40.2 % (ref 39.0–52.0)
Hemoglobin: 13.1 g/dL (ref 13.0–17.0)
Immature Granulocytes: 1 %
Lymphocytes Relative: 18 %
Lymphs Abs: 1.1 10*3/uL (ref 0.7–4.0)
MCH: 27.2 pg (ref 26.0–34.0)
MCHC: 32.6 g/dL (ref 30.0–36.0)
MCV: 83.6 fL (ref 80.0–100.0)
Monocytes Absolute: 0.4 10*3/uL (ref 0.1–1.0)
Monocytes Relative: 7 %
Neutro Abs: 4.2 10*3/uL (ref 1.7–7.7)
Neutrophils Relative %: 72 %
Platelets: 187 10*3/uL (ref 150–400)
RBC: 4.81 MIL/uL (ref 4.22–5.81)
RDW: 14.2 % (ref 11.5–15.5)
WBC: 5.8 10*3/uL (ref 4.0–10.5)
nRBC: 0 % (ref 0.0–0.2)

## 2020-07-16 LAB — APTT: aPTT: 28 seconds (ref 24–36)

## 2020-07-16 LAB — BASIC METABOLIC PANEL
Anion gap: 6 (ref 5–15)
BUN: 25 mg/dL — ABNORMAL HIGH (ref 6–20)
CO2: 23 mmol/L (ref 22–32)
Calcium: 9.2 mg/dL (ref 8.9–10.3)
Chloride: 109 mmol/L (ref 98–111)
Creatinine, Ser: 1.74 mg/dL — ABNORMAL HIGH (ref 0.61–1.24)
GFR, Estimated: 49 mL/min — ABNORMAL LOW (ref >60)
Glucose, Bld: 110 mg/dL — ABNORMAL HIGH (ref 70–99)
Potassium: 4 mmol/L (ref 3.5–5.1)
Sodium: 138 mmol/L (ref 135–145)

## 2020-07-16 LAB — PROTIME-INR
INR: 1 (ref 0.8–1.2)
Prothrombin Time: 13.3 seconds (ref 11.4–15.2)

## 2020-07-19 ENCOUNTER — Telehealth: Payer: Self-pay | Admitting: Urgent Care

## 2020-07-19 NOTE — Progress Notes (Signed)
  Perioperative Services Pre-Admission/Anesthesia Testing     Date: 07/19/20  Name: Scott Hickman MRN:   [id number]  Re: Surgical clearance  Surgical clearance received from Dr. Concha Se office (nephrology/transplant). Patient has been cleared for the planned elective knee arthroscopy with medial meniscectomy scheduled for 07/22/2020 with Dr. Thornton Park. Patient optimized and cleared from a nephrology/transplant perspective at an overall ACCEPTABLE risk stratification. Copy of signed clearance form placed on patient's OR chart for review by the surgical/anesthetic team on the day of his procedure.   Honor Loh, MSN, APRN, FNP-C, CEN [facility]  Peri-operative Services Nurse Practitioner Phone: [phone] 07/19/20 3:52 PM

## 2020-07-22 ENCOUNTER — Ambulatory Visit
Admission: RE | Admit: 2020-07-22 | Payer: No Typology Code available for payment source | Source: Home / Self Care | Admitting: Orthopedic Surgery

## 2020-07-22 ENCOUNTER — Encounter: Admission: RE | Payer: Self-pay | Source: Home / Self Care

## 2020-07-22 SURGERY — ARTHROSCOPY, KNEE, WITH MEDIAL MENISCECTOMY
Anesthesia: General | Site: Knee | Laterality: Right

## 2020-08-06 ENCOUNTER — Encounter: Admit: 2020-08-06 | Discharge: 2020-08-07 | Payer: PRIVATE HEALTH INSURANCE

## 2020-08-06 DIAGNOSIS — D849 Immunodeficiency, unspecified: Principal | ICD-10-CM

## 2020-08-06 DIAGNOSIS — Z940 Kidney transplant status: Principal | ICD-10-CM

## 2020-08-06 MED ORDER — TACROLIMUS 0.5 MG CAPSULE, IMMEDIATE-RELEASE
ORAL_CAPSULE | Freq: Two times a day (BID) | ORAL | 3 refills | 90 days | Status: CP
Start: 2020-08-06 — End: ?

## 2020-08-06 MED ORDER — DOXEPIN 6 MG TABLET
ORAL_TABLET | Freq: Every evening | ORAL | 3 refills | 90.00000 days | Status: CP
Start: 2020-08-06 — End: ?

## 2020-08-06 MED ORDER — LOSARTAN 50 MG TABLET
ORAL_TABLET | Freq: Two times a day (BID) | ORAL | 3 refills | 90.00000 days | Status: CP
Start: 2020-08-06 — End: 2021-08-06

## 2020-08-06 MED ORDER — ESCITALOPRAM 10 MG TABLET
ORAL_TABLET | Freq: Every day | ORAL | 3 refills | 90.00000 days | Status: CP
Start: 2020-08-06 — End: 2021-08-06

## 2020-08-06 MED ORDER — MYCOPHENOLATE SODIUM 180 MG TABLET,DELAYED RELEASE
ORAL_TABLET | Freq: Two times a day (BID) | ORAL | 3 refills | 90.00000 days | Status: CP
Start: 2020-08-06 — End: ?

## 2020-08-06 MED ORDER — AMLODIPINE 2.5 MG TABLET
ORAL_TABLET | ORAL | 11 refills | 0.00000 days | Status: CP
Start: 2020-08-06 — End: ?

## 2020-08-09 DIAGNOSIS — Z940 Kidney transplant status: Principal | ICD-10-CM

## 2020-08-17 DIAGNOSIS — Z940 Kidney transplant status: Principal | ICD-10-CM

## 2020-08-17 MED ORDER — MYCOPHENOLATE SODIUM 180 MG TABLET,DELAYED RELEASE
ORAL_TABLET | Freq: Two times a day (BID) | ORAL | 3 refills | 90 days | Status: CP
Start: 2020-08-17 — End: ?

## 2020-08-25 DIAGNOSIS — Z940 Kidney transplant status: Principal | ICD-10-CM

## 2020-08-30 ENCOUNTER — Encounter (INDEPENDENT_AMBULATORY_CARE_PROVIDER_SITE_OTHER): Payer: PRIVATE HEALTH INSURANCE | Admitting: Ophthalmology

## 2020-08-31 MED ORDER — DOXEPIN 10 MG CAPSULE
ORAL_CAPSULE | Freq: Every evening | ORAL | 3 refills | 90 days | Status: CP
Start: 2020-08-31 — End: 2021-08-31

## 2020-09-10 ENCOUNTER — Encounter (INDEPENDENT_AMBULATORY_CARE_PROVIDER_SITE_OTHER): Payer: PRIVATE HEALTH INSURANCE | Admitting: Ophthalmology

## 2020-09-16 ENCOUNTER — Other Ambulatory Visit: Payer: Self-pay

## 2020-09-16 ENCOUNTER — Other Ambulatory Visit: Payer: Self-pay | Admitting: Orthopedic Surgery

## 2020-09-16 ENCOUNTER — Other Ambulatory Visit
Admission: RE | Admit: 2020-09-16 | Discharge: 2020-09-16 | Disposition: A | Discharge status: Home / Self Care | Payer: No Typology Code available for payment source | Source: Ambulatory Visit | Attending: Orthopedic Surgery | Admitting: Orthopedic Surgery

## 2020-09-16 HISTORY — DX: COVID-19: U07.1

## 2020-09-16 NOTE — Patient Instructions (Addendum)
Your procedure is scheduled on: 09/23/20 Report to the Registration Desk on the 1st floor of the [facility]. To find out your arrival time, please call [phone] between 1PM - 3PM on: 09/22/20   REMEMBER: Instructions that are not followed completely may result in serious medical risk, up to and including death; or upon the discretion of your surgeon and anesthesiologist your surgery may need to be rescheduled.  Do not eat food after midnight the night before surgery.  No gum chewing, lozengers or hard candies.  You may however, drink CLEAR liquids up to 2 hours before you are scheduled to arrive for your surgery. Do not drink anything within 2 hours of your scheduled arrival time.  Clear liquids include: - water  - apple juice without pulp - gatorade (not RED, PURPLE, OR BLUE) - black coffee or tea (Do NOT add milk or creamers to the coffee or tea) Do NOT drink anything that is not on this list.  TAKE THESE MEDICATIONS THE MORNING OF SURGERY WITH A SIP OF WATER:  - amLODipine (NORVASC) 2.5 MG tablet - escitalopram (LEXAPRO) 10 MG tablet - mycophenolate (MYFORTIC) 180 MG EC tablet - tacrolimus (PROGRAF) 0.5 MG capsule  One week prior to surgery: Stop Anti-inflammatories (NSAIDS) such as Advil, Aleve, Ibuprofen, Motrin, Naproxen, Naprosyn and Aspirin based products such as Excedrin, Goodys Powder, BC Powder.  Stop ANY OVER THE COUNTER supplements until after surgery.  You may however, continue to take Tylenol if needed for pain up until the day of surgery.  No Alcohol for 24 hours before or after surgery.  No Smoking including e-cigarettes for 24 hours prior to surgery.  No chewable tobacco products for at least 6 hours prior to surgery.  No nicotine patches on the day of surgery.  Do not use any "recreational" drugs for at least a week prior to your surgery.  Please be advised that the combination of cocaine and anesthesia may have negative outcomes, up to and including  death. If you test positive for cocaine, your surgery will be cancelled.  On the morning of surgery brush your teeth with toothpaste and water, you may rinse your mouth with mouthwash if you wish. Do not swallow any toothpaste or mouthwash.  Do not wear jewelry, make-up, hairpins, clips or nail polish.  Do not wear lotions, powders, or perfumes.   Do not shave body from the neck down 48 hours prior to surgery just in case you cut yourself which could leave a site for infection.  Also, freshly shaved skin may become irritated if using the CHG soap.  Contact lenses, hearing aids and dentures may not be worn into surgery.  Do not bring valuables to the hospital. [facility] is not responsible for any missing/lost belongings or valuables.   Use CHG Soap or wipes as directed on instruction sheet.  Notify your doctor if there is any change in your medical condition (cold, fever, infection).  Wear comfortable clothing (specific to your surgery type) to the hospital.  After surgery, you can help prevent lung complications by doing breathing exercises.  Take deep breaths and cough every 1-2 hours. Your doctor may order a device called an Incentive Spirometer to help you take deep breaths. When coughing or sneezing, hold a pillow firmly against your incision with both hands. This is called "splinting." Doing this helps protect your incision. It also decreases belly discomfort.  If you are being admitted to the hospital overnight, leave your suitcase in the car. After surgery  it may be brought to your room.  If you are being discharged the day of surgery, you will not be allowed to drive home. You will need a responsible adult (18 years or older) to drive you home and stay with you that night.   If you are taking public transportation, you will need to have a responsible adult (18 years or older) with you. Please confirm with your physician that it is acceptable to use public transportation.    Please call the [facility] at [phone] if you have any questions about these instructions.  Surgery Visitation Policy:  Patients undergoing a surgery or procedure may have one family member or support person with them as long as that person is not COVID-19 positive or experiencing its symptoms.  That person may remain in the waiting area during the procedure.  Inpatient Visitation:    Visiting hours are 7 a.m. to 8 p.m. Inpatients will be allowed two visitors daily. The visitors may change each day during the patient's stay. No visitors under the age of 63. Any visitor under the age of 22 must be accompanied by an adult. The visitor must pass COVID-19 screenings, use hand sanitizer when entering and exiting the patient's room and wear a mask at all times, including in the patient's room. Patients must also wear a mask when staff or their visitor are in the room. Masking is required regardless of vaccination status.

## 2020-09-22 MED ORDER — CHLORHEXIDINE GLUCONATE CLOTH 2 % EX PADS: 6.0000 | MEDICATED_PAD | Freq: Once | CUTANEOUS | Status: DC

## 2020-09-22 MED ORDER — CHLORHEXIDINE GLUCONATE 0.12 % MT SOLN: 15.0000 mL | Freq: Once | OROMUCOSAL | Status: AC

## 2020-09-22 MED ORDER — FAMOTIDINE 20 MG PO TABS: 20.0000 mg | ORAL_TABLET | Freq: Once | ORAL | Status: AC

## 2020-09-22 MED ORDER — CEFAZOLIN SODIUM-DEXTROSE 2-4 GM/100ML-% IV SOLN
2.0000 g | INTRAVENOUS | Status: AC
  Administered 2020-09-23: 2 g via INTRAVENOUS

## 2020-09-22 MED ORDER — CELECOXIB 200 MG PO CAPS: 200.0000 mg | ORAL_CAPSULE | ORAL | Status: AC

## 2020-09-22 MED ORDER — ACETAMINOPHEN 500 MG PO TABS: 1000.0000 mg | ORAL_TABLET | ORAL | Status: AC

## 2020-09-22 MED ORDER — LACTATED RINGERS IV SOLN: INTRAVENOUS | Status: DC

## 2020-09-22 MED ORDER — ORAL CARE MOUTH RINSE: 15.0000 mL | Freq: Once | OROMUCOSAL | Status: AC

## 2020-09-23 ENCOUNTER — Encounter
Admission: RE | Disposition: A | Discharge status: Home / Self Care | Payer: Self-pay | Source: Home / Self Care | Attending: Orthopedic Surgery

## 2020-09-23 ENCOUNTER — Ambulatory Visit: Payer: BC Managed Care – PPO | Admitting: Urgent Care

## 2020-09-23 ENCOUNTER — Encounter: Payer: Self-pay | Admitting: Orthopedic Surgery

## 2020-09-23 ENCOUNTER — Ambulatory Visit
Admission: RE | Admit: 2020-09-23 | Discharge: 2020-09-23 | Disposition: A | Discharge status: Home / Self Care | Payer: BC Managed Care – PPO | Attending: Orthopedic Surgery | Admitting: Orthopedic Surgery

## 2020-09-23 ENCOUNTER — Other Ambulatory Visit: Payer: Self-pay

## 2020-09-23 DIAGNOSIS — N189 Chronic kidney disease, unspecified: Secondary | ICD-10-CM | POA: Insufficient documentation

## 2020-09-23 DIAGNOSIS — X58XXXA Exposure to other specified factors, initial encounter: Secondary | ICD-10-CM | POA: Insufficient documentation

## 2020-09-23 DIAGNOSIS — Z841 Family history of disorders of kidney and ureter: Secondary | ICD-10-CM | POA: Insufficient documentation

## 2020-09-23 DIAGNOSIS — Q612 Polycystic kidney, adult type: Secondary | ICD-10-CM | POA: Diagnosis not present

## 2020-09-23 DIAGNOSIS — Z940 Kidney transplant status: Secondary | ICD-10-CM | POA: Diagnosis not present

## 2020-09-23 DIAGNOSIS — I129 Hypertensive chronic kidney disease with stage 1 through stage 4 chronic kidney disease, or unspecified chronic kidney disease: Secondary | ICD-10-CM | POA: Insufficient documentation

## 2020-09-23 DIAGNOSIS — Z8616 Personal history of COVID-19: Secondary | ICD-10-CM | POA: Insufficient documentation

## 2020-09-23 DIAGNOSIS — Z880 Allergy status to penicillin: Secondary | ICD-10-CM | POA: Insufficient documentation

## 2020-09-23 DIAGNOSIS — Z79899 Other long term (current) drug therapy: Secondary | ICD-10-CM | POA: Diagnosis not present

## 2020-09-23 DIAGNOSIS — S83231A Complex tear of medial meniscus, current injury, right knee, initial encounter: Secondary | ICD-10-CM | POA: Insufficient documentation

## 2020-09-23 DIAGNOSIS — Z87891 Personal history of nicotine dependence: Secondary | ICD-10-CM | POA: Insufficient documentation

## 2020-09-23 DIAGNOSIS — Y939 Activity, unspecified: Secondary | ICD-10-CM | POA: Diagnosis not present

## 2020-09-23 DIAGNOSIS — Z888 Allergy status to other drugs, medicaments and biological substances status: Secondary | ICD-10-CM | POA: Insufficient documentation

## 2020-09-23 HISTORY — PX: KNEE ARTHROSCOPY WITH MEDIAL MENISECTOMY: SHX5651

## 2020-09-23 SURGERY — ARTHROSCOPY, KNEE, WITH MEDIAL MENISCECTOMY
Anesthesia: General | Site: Knee | Laterality: Right

## 2020-09-23 MED ORDER — ACETAMINOPHEN 500 MG PO TABS
ORAL_TABLET | ORAL | Status: AC
  Administered 2020-09-23: 1000 mg via ORAL
  Filled 2020-09-23: qty 2

## 2020-09-23 MED ORDER — PROPOFOL 10 MG/ML IV BOLUS
INTRAVENOUS | Status: AC
  Filled 2020-09-23: qty 20

## 2020-09-23 MED ORDER — DEXMEDETOMIDINE (PRECEDEX) IN NS 20 MCG/5ML (4 MCG/ML) IV SYRINGE
PREFILLED_SYRINGE | INTRAVENOUS | Status: DC | PRN
  Administered 2020-09-23: 8 ug via INTRAVENOUS

## 2020-09-23 MED ORDER — CELECOXIB 200 MG PO CAPS
ORAL_CAPSULE | ORAL | Status: AC
  Administered 2020-09-23: 200 mg via ORAL
  Filled 2020-09-23: qty 1

## 2020-09-23 MED ORDER — MIDAZOLAM HCL 2 MG/2ML IJ SOLN
INTRAMUSCULAR | Status: DC | PRN
  Administered 2020-09-23: 2 mg via INTRAVENOUS

## 2020-09-23 MED ORDER — HYDROCODONE-ACETAMINOPHEN 5-325 MG PO TABS: 1.0000 | ORAL_TABLET | ORAL | 0 refills | Status: AC | PRN

## 2020-09-23 MED ORDER — OXYCODONE HCL 5 MG/5ML PO SOLN: 5.0000 mg | Freq: Once | ORAL | Status: DC | PRN

## 2020-09-23 MED ORDER — CHLORHEXIDINE GLUCONATE 0.12 % MT SOLN
OROMUCOSAL | Status: AC
  Administered 2020-09-23: 15 mL via OROMUCOSAL
  Filled 2020-09-23: qty 15

## 2020-09-23 MED ORDER — ONDANSETRON HCL 4 MG PO TABS: 4.0000 mg | ORAL_TABLET | Freq: Three times a day (TID) | ORAL | 0 refills | Status: AC | PRN

## 2020-09-23 MED ORDER — FENTANYL CITRATE (PF) 100 MCG/2ML IJ SOLN
INTRAMUSCULAR | Status: AC
  Filled 2020-09-23: qty 2

## 2020-09-23 MED ORDER — MIDAZOLAM HCL 2 MG/2ML IJ SOLN
INTRAMUSCULAR | Status: AC
  Filled 2020-09-23: qty 2

## 2020-09-23 MED ORDER — LIDOCAINE HCL (PF) 1 % IJ SOLN
INTRAMUSCULAR | Status: AC
  Filled 2020-09-23: qty 30

## 2020-09-23 MED ORDER — BUPIVACAINE-EPINEPHRINE (PF) 0.25% -1:200000 IJ SOLN
INTRAMUSCULAR | Status: DC | PRN
  Administered 2020-09-23: 30 mL via PERINEURAL

## 2020-09-23 MED ORDER — LIDOCAINE HCL (CARDIAC) PF 100 MG/5ML IV SOSY
PREFILLED_SYRINGE | INTRAVENOUS | Status: DC | PRN
Start: 2020-09-23 — End: 2020-09-23
  Administered 2020-09-23: 100 mg via INTRAVENOUS

## 2020-09-23 MED ORDER — FENTANYL CITRATE (PF) 100 MCG/2ML IJ SOLN
INTRAMUSCULAR | Status: DC | PRN
  Administered 2020-09-23 (×4): 50 ug via INTRAVENOUS

## 2020-09-23 MED ORDER — EPHEDRINE SULFATE 50 MG/ML IJ SOLN
INTRAMUSCULAR | Status: DC | PRN
  Administered 2020-09-23: 5 mg via INTRAVENOUS
  Administered 2020-09-23: 10 mg via INTRAVENOUS

## 2020-09-23 MED ORDER — CEFAZOLIN SODIUM-DEXTROSE 2-4 GM/100ML-% IV SOLN
INTRAVENOUS | Status: AC
  Filled 2020-09-23: qty 100

## 2020-09-23 MED ORDER — ONDANSETRON HCL 4 MG/2ML IJ SOLN
INTRAMUSCULAR | Status: DC | PRN
  Administered 2020-09-23: 4 mg via INTRAVENOUS

## 2020-09-23 MED ORDER — PROPOFOL 10 MG/ML IV BOLUS
INTRAVENOUS | Status: DC | PRN
  Administered 2020-09-23: 200 mg via INTRAVENOUS

## 2020-09-23 MED ORDER — FAMOTIDINE 20 MG PO TABS
ORAL_TABLET | ORAL | Status: AC
  Administered 2020-09-23: 20 mg via ORAL
  Filled 2020-09-23: qty 1

## 2020-09-23 MED ORDER — PHENYLEPHRINE HCL (PRESSORS) 10 MG/ML IV SOLN
INTRAVENOUS | Status: DC | PRN
  Administered 2020-09-23 (×3): 50 ug via INTRAVENOUS

## 2020-09-23 MED ORDER — SODIUM CHLORIDE 0.9 % IR SOLN
Status: DC | PRN
  Administered 2020-09-23 (×4): 3000 mL

## 2020-09-23 MED ORDER — DEXAMETHASONE SODIUM PHOSPHATE 10 MG/ML IJ SOLN
INTRAMUSCULAR | Status: DC | PRN
  Administered 2020-09-23: 10 mg via INTRAVENOUS

## 2020-09-23 MED ORDER — FENTANYL CITRATE (PF) 100 MCG/2ML IJ SOLN: 25.0000 ug | INTRAMUSCULAR | Status: DC | PRN

## 2020-09-23 MED ORDER — OXYCODONE HCL 5 MG PO TABS: 5.0000 mg | ORAL_TABLET | Freq: Once | ORAL | Status: DC | PRN

## 2020-09-23 MED ORDER — BUPIVACAINE-EPINEPHRINE (PF) 0.25% -1:200000 IJ SOLN
INTRAMUSCULAR | Status: AC
  Filled 2020-09-23: qty 30

## 2020-09-23 MED ORDER — LIDOCAINE HCL (PF) 1 % IJ SOLN
INTRAMUSCULAR | Status: DC | PRN
  Administered 2020-09-23: 4 mL

## 2020-09-23 SURGICAL SUPPLY — 40 items
ADAPTER IRRIG TUBE 2 SPIKE SOL (ADAPTER) ×4 IMPLANT
ADPR TBG 2 SPK PMP STRL ASCP (ADAPTER) ×2
BLADE FULL RADIUS 3.5 (BLADE) ×2 IMPLANT
BLADE SHAVER 4.5X7 STR FR (MISCELLANEOUS) ×2 IMPLANT
BUR BR 5.5 WIDE MOUTH (BURR) IMPLANT
CUFF TOURN SGL QUICK 24 (TOURNIQUET CUFF)
CUFF TOURN SGL QUICK 34 (TOURNIQUET CUFF)
CUFF TRNQT CYL 24X4X16.5-23 (TOURNIQUET CUFF) IMPLANT
CUFF TRNQT CYL 34X4.125X (TOURNIQUET CUFF) IMPLANT
DRAPE IMP U-DRAPE 54X76 (DRAPES) ×2 IMPLANT
DURAPREP 26ML APPLICATOR (WOUND CARE) ×6 IMPLANT
GAUZE SPONGE 4X4 12PLY STRL (GAUZE/BANDAGES/DRESSINGS) ×2 IMPLANT
GAUZE XEROFORM 1X8 LF (GAUZE/BANDAGES/DRESSINGS) ×2 IMPLANT
GLOVE SURG ORTHO LTX SZ9 (GLOVE) ×4 IMPLANT
GLOVE SURG UNDER POLY LF SZ9 (GLOVE) ×2 IMPLANT
GOWN STRL REUS W/ TWL LRG LVL3 (GOWN DISPOSABLE) ×1 IMPLANT
GOWN STRL REUS W/TWL 2XL LVL3 (GOWN DISPOSABLE) ×2 IMPLANT
GOWN STRL REUS W/TWL LRG LVL3 (GOWN DISPOSABLE) ×2
IV LACTATED RINGER IRRG 3000ML (IV SOLUTION) ×12
IV LR IRRIG 3000ML ARTHROMATIC (IV SOLUTION) ×6 IMPLANT
KIT TURNOVER KIT A (KITS) ×2 IMPLANT
MANIFOLD NEPTUNE II (INSTRUMENTS) ×4 IMPLANT
MAT ABSORB  FLUID 56X50 GRAY (MISCELLANEOUS) ×1
MAT ABSORB FLUID 56X50 GRAY (MISCELLANEOUS) ×1 IMPLANT
NEEDLE HYPO 22GX1.5 SAFETY (NEEDLE) ×2 IMPLANT
PACK ARTHROSCOPY KNEE (MISCELLANEOUS) ×2 IMPLANT
PAD ABD DERMACEA PRESS 5X9 (GAUZE/BANDAGES/DRESSINGS) ×4 IMPLANT
SHAVER BLADE BONE CUTTER 4.5 (BLADE) ×2 IMPLANT
SHAVER BLADE TAPERED BLUNT 4 (BLADE) ×2 IMPLANT
SOL PREP PVP 2OZ (MISCELLANEOUS) ×2
SOLUTION PREP PVP 2OZ (MISCELLANEOUS) ×1 IMPLANT
SPONGE T-LAP 18X18 ~~LOC~~+RFID (SPONGE) ×2 IMPLANT
STRIP CLOSURE SKIN 1/2X4 (GAUZE/BANDAGES/DRESSINGS) ×2 IMPLANT
SUT ETHILON 4-0 (SUTURE) ×2
SUT ETHILON 4-0 FS2 18XMFL BLK (SUTURE) ×1
SUTURE ETHLN 4-0 FS2 18XMF BLK (SUTURE) ×1 IMPLANT
TUBING INFLOW SET DBFLO PUMP (TUBING) ×2 IMPLANT
TUBING OUTFLOW SET DBLFO PUMP (TUBING) ×2 IMPLANT
WAND WEREWOLF FLOW 90D (MISCELLANEOUS) ×2 IMPLANT
WATER STERILE IRR 500ML POUR (IV SOLUTION) ×2 IMPLANT

## 2020-09-23 NOTE — Discharge Instructions (Signed)

## 2020-09-23 NOTE — H&P (Signed)
PREOPERATIVE H&P  Chief Complaint: Right Knee Medial Meniscus Tear  HPI: Scott Hickman is a 45 y.o. male who presents for preoperative history and physical with a diagnosis of Right Knee Medial Meniscus Tear confirmed by MRI.Marland Kitchen Symptoms of pain, clicking and popping are significantly impairing activities of daily living.  He has failed nonoperative management.  Patient wished to proceed with a right knee partial medial meniscectomy.    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   2/2 history of ADPKD   Poor sleep    Past Surgical History:  Procedure Laterality Date   APPENDECTOMY     AV FISTULA PLACEMENT  01/2018   AV FISTULA PLACEMENT, BRACHIOCEPHALIC     EYE SURGERY Right 04/2020   KIDNEY TRANSPLANT Left 06/25/2018   Procedure: Renal allotranplantation (living donor); Location: [facility]; Surgeon: Loyal Buba, MD   NEPHRECTOMY LIVING DONOR Bilateral    OLECRANON BURSECTOMY Right 04/01/2018   Procedure: EXCISION, OLECRANON BURSA RIGHT;  Surgeon: Earnestine Leys, MD;  Location: [facility] ORS;  Service: Orthopedics;  Laterality: Right;   SMALL INTESTINE SURGERY  1998   Social History   Socioeconomic History   Marital status: Married    Spouse name: Claiborne Billings   Number of children: 2   Years of education: Not on file   Highest education level: Not on file  Occupational History   Not on file  Tobacco Use   Smoking status: Former    Packs/day: 0.50    Years: 2.00    Pack years: 1.00    Types: Cigarettes    Quit date: 03/28/1998    Years since  quitting: 22.5   Smokeless tobacco: Former  Scientific laboratory technician Use: Never used  Substance and Sexual Activity   Alcohol use: Yes    Alcohol/week: 0.0 standard drinks    Comment: RARE   Drug use: No   Sexual activity: Yes  Other Topics Concern   Not on file  Social History Narrative   Not on file   Social Determinants of Health   Financial Resource Strain: Not on file  Food Insecurity: Not on file  Transportation Needs: Not on file  Physical Activity: Not on file  Stress: Not on file  Social Connections: Not on file   Family History  Problem Relation Age of Onset   Kidney disease Father    Allergies  Allergen Reactions   Benazepril Rash   Biaxin [Clarithromycin] Rash   Penicillins Rash   Prior to Admission medications   Medication Sig Start Date End Date Taking? Authorizing Provider  acetaminophen (TYLENOL) 650 MG CR tablet Take 1,300 mg by mouth every 8 (eight) hours as needed for pain.   Yes [provider]  amLODipine (NORVASC) 2.5 MG tablet Take 2.5 mg by mouth in the morning and at bedtime. 04/21/20  Yes [provider]  Cholecalciferol (VITAMIN D) 50 MCG (2000 UT) tablet Take 2,000 Units by mouth every evening. 07/16/18  Yes [provider]  clindamycin (CLEOCIN T) 1 %  external solution Apply 1 application topically 2 (two) times daily as needed (scalp irritation). 06/23/20  Yes [provider]  doxepin (SINEQUAN) 10 MG capsule Take 10 mg by mouth at bedtime.   Yes [provider]  escitalopram (LEXAPRO) 10 MG tablet Take 10 mg by mouth every morning.  10/18/17 07/09/23 Yes [provider]  ketoconazole (NIZORAL) 2 % shampoo Apply 1 application topically daily as needed (scalp irritation). 03/20/20  Yes [provider]  losartan (COZAAR) 50 MG tablet Take 50 mg by mouth 2 (two) times daily. 04/24/20  Yes [provider]  mycophenolate (MYFORTIC) 180 MG EC tablet Take 360 mg by mouth 2 (two) times daily.  11/11/18  Yes [provider]  tacrolimus (PROGRAF) 0.5 MG capsule Take 1.5 mg by mouth in the morning and at bedtime. 06/22/20  Yes [provider]  tadalafil (CIALIS) 20 MG tablet Take 20 mg by mouth daily as needed for erectile dysfunction. 06/08/20  Yes [provider]     Positive ROS: All other systems have been reviewed and were otherwise negative with the exception of those mentioned in the HPI and as above.  Physical Exam: General: Alert, no acute distress Cardiovascular: Regular rate and rhythm, no murmurs rubs or gallops.  No pedal edema Respiratory: Clear to auscultation bilaterally, no wheezes rales or rhonchi. No cyanosis, no use of accessory musculature GI: No organomegaly, abdomen is soft and non-tender nondistended with positive bowel sounds. Skin: Skin intact, no lesions within the operative field. Neurologic: Sensation intact distally Psychiatric: Patient is competent for consent with normal mood and affect Lymphatic: No cervical lymphadenopathy  MUSCULOSKELETAL: Right knee: Patient skin is intact.  There is no erythema ecchymosis or effusion.  His range of motion is from 0 to 120 degrees of flexion.  Patient is no ligamentous laxity but had tenderness over the medial joint line and positive McMurray's test.  Distally is neurovascular intact.  Assessment: Right Knee Medial Meniscus Tear  Plan: Plan for Procedure(s): RIGHT KNEE ARTHROSCOPY WITH PARTIAL MEDIAL MENISECTOMY  I reviewed the details of the operation as well as the postoperative course with the patient.  I reviewed his labs and MRI in preparation for this case.  I answered all the patient's questions.  I marked the right knee according hospital's correct site of surgery protocol.  I discussed the risks and benefits of surgery. The risks include but are not limited to infection, bleeding, nerve or blood vessel injury, joint stiffness or loss of motion, persistent pain, weakness or  instability, retear of the meniscus, the development of osteoarthritis and the need for further surgery. Medical risks include but are not limited to DVT and pulmonary embolism, myocardial infarction, stroke, pneumonia, respiratory failure and death. Patient understood these risks and wished to proceed.     Thornton Park, MD   09/23/2020 11:19 AM

## 2020-09-23 NOTE — Transfer of Care (Signed)
Immediate Anesthesia Transfer of Care Note  Patient: Ilda Foil  Procedure(s) Performed: RIGHT KNEE ARTHROSCOPY WITH MEDIAL MENISECTOMY (Right: Knee)  Patient Location: PACU  Anesthesia Type:General  Level of Consciousness: drowsy  Airway & Oxygen Therapy: Patient Spontanous Breathing  Post-op Assessment: Report given to RN and Post -op Vital signs reviewed and stable  Post vital signs: Reviewed and stable  Last Vitals:  Vitals Value Taken Time  BP 116/72 09/23/20 1257  Temp    Pulse 70 09/23/20 1259  Resp 20 09/23/20 1259  SpO2 99 % 09/23/20 1259  Vitals shown include unvalidated device data.  Last Pain:  Vitals:   09/23/20 1000  TempSrc: Oral         Complications: No notable events documented.

## 2020-09-23 NOTE — Op Note (Signed)
  PATIENT:  Scott Hickman  PRE-OPERATIVE DIAGNOSIS:  TEAR OF MEDIAL MENISCUS, RIGHT KNEE  POST-OPERATIVE DIAGNOSIS:  Same  PROCEDURE:  RIGHT KNEE ARTHROSCOPIC Partial MEDIAL MENISECTOMY  SURGEON:  Thornton Park, MD  ANESTHESIA:   General  PREOPERATIVE INDICATIONS:  Scott Hickman  45 y.o. male with a diagnosis of COMPLEX TEAR OF MEDIAL MENISCUS of the right knee who failed conservative management and elected for surgical management.    The risks benefits and alternatives were discussed with the patient preoperatively including the risks of infection, bleeding, nerve injury, knee stiffness, persistent pain, osteoarthritis and the need for further surgery. Medical  risks include DVT and pulmonary embolism, myocardial infarction, stroke, pneumonia, respiratory failure and death. The patient understood these risks and wished to proceed.  OPERATIVE FINDINGS: Complex tear of the medial meniscus involving large flap components of the body and posterior horn near the root.  OPERATIVE PROCEDURE: Patient was met in the preoperative area. The operative extremity was signed with the word yes and my initials according the hospital's correct site of surgery protocol.  Preop history and physical was performed at the bedside.  I reviewed the details of the operation as well as the postoperative course and answered all the patient's questions.  The patient was brought to the operating room where he was placed supine on the operative table. General anesthesia was administered. The patient was prepped and draped in a sterile fashion.  A timeout was performed to verify the patient's name, date of birth, medical record number, correct site of surgery correct procedure to be performed. It was also used to verify the patient received antibiotics that all appropriate instruments, and radiographic studies were available in the room. Once all in attendance were in agreement, the case began.  Proposed  arthroscopy incisions were drawn out with a surgical marker. These were pre-injected with 1% lidocaine plain. An 11 blade was used to establish an inferior lateral and inferomedial portals. The inferomedial portal was created using a 18-gauge spinal needle under direct visualization.  A full diagnostic examination of the knee was performed including the suprapatellar pouch, patellofemoral joint, medial lateral compartments as well as the medial lateral gutters, the intercondylar notch in the posterior knee.  Findings on arthroscopy included extensive medial meniscus tear with flap components of the body extending into the anterior horn as well as a second flap tear involving the posterior horn near the posterior root.  Patient had a low-grade chronic sprain of the ACL as well as mild chondral fraying of the medial trochlea.  There were no focal chondral lesions seen.  Patient had fissuring of the chondral surface of the lateral tibial plateau near the intercondylar notch.  Patient had the meniscal tear treated with a flyer arthroscopy blade and straight duckbill basket. The meniscus was debrided until a stable rim was achieved.  This was confirmed with a hook probe.  The knee was then copiously lavaged. All arthroscopic instruments were removed. The 2 arthroscopy portals were closed with 4-0 nylon. Steri-Strips were applied along with a dry sterile and compressive dressing. The patient was brought to the PACU in stable condition. I was scrubbed and present for the entire case and all sharp and instrument counts were correct at the conclusion the case. I spoke with the patient's wife postoperatively to let her know the case was performed without complication and the patient was stable in the recovery room.    Timoteo Gaul, MD

## 2020-09-23 NOTE — Anesthesia Preprocedure Evaluation (Signed)
Anesthesia Evaluation  Patient identified by MRN, date of birth, ID band Patient awake    Reviewed: Allergy & Precautions, NPO status , Patient's Chart, lab work & pertinent test results  History of Anesthesia Complications Negative for: history of anesthetic complications  Airway Mallampati: III  TM Distance: <3 FB Neck ROM: full    Dental  (+) Chipped   Pulmonary neg shortness of breath, former smoker,    Pulmonary exam normal        Cardiovascular hypertension, Normal cardiovascular exam     Neuro/Psych PSYCHIATRIC DISORDERS negative neurological ROS     GI/Hepatic Neg liver ROS, GERD  Medicated and Controlled,  Endo/Other  negative endocrine ROS  Renal/GU Renal disease     Musculoskeletal   Abdominal   Peds  Hematology negative hematology ROS (+)   Anesthesia Other Findings Past Medical History: No date: ADPKD (autosomal dominant polycystic kidney disease)     Comment:  s/p renal transplant in 06/2018 No date: Anemia No date: Anxiety 01/04/2015: CKD (chronic kidney disease) No date: COVID-19 No date: Erectile dysfunction     Comment:  on PDE5i (tadalafil) No date: GERD (gastroesophageal reflux disease)     Comment:  RARE-NO MEDS No date: History of nephrolithiasis No date: Hypertension No date: Immunocompromised ([place])     Comment:  s/p renal transplant; takes Prograf and Myfortic 06/25/2018: Living-donor kidney transplant recipient     Comment:  2/2 history of ADPKD No date: Poor sleep  Past Surgical History: No date: APPENDECTOMY 01/2018: AV FISTULA PLACEMENT No date: AV FISTULA PLACEMENT, BRACHIOCEPHALIC [id number]: EYE SURGERY; Right 06/25/2018: KIDNEY TRANSPLANT; Left     Comment:  Procedure: Renal allotranplantation (living donor);               Location: [facility]; Surgeon: Loyal Buba, MD No date: NEPHRECTOMY LIVING DONOR; Bilateral 04/01/2018: OLECRANON BURSECTOMY; Right     Comment:   Procedure: [id number], OLECRANON BURSA RIGHT;  Surgeon:               Earnestine Leys, MD;  Location: [facility] ORS;  Service:               Orthopedics;  Laterality: Right; 1998: SMALL INTESTINE SURGERY  BMI    Body Mass Index: 34.26 kg/m      Reproductive/Obstetrics negative OB ROS                             Anesthesia Physical Anesthesia Plan  ASA: 3  Anesthesia Plan: General LMA   Post-op Pain Management:    Induction: Intravenous  PONV Risk Score and Plan: Dexamethasone, Ondansetron, Midazolam and Treatment may vary due to age or medical condition  Airway Management Planned: LMA  Additional Equipment:   Intra-op Plan:   Post-operative Plan: Extubation in OR  Informed Consent: I have reviewed the patients History and Physical, chart, labs and discussed the procedure including the risks, benefits and alternatives for the proposed anesthesia with the patient or authorized representative who has indicated his/her understanding and acceptance.     Dental Advisory Given  Plan Discussed with: Anesthesiologist, CRNA and Surgeon  Anesthesia Plan Comments: (Patient consented for risks of anesthesia including but not limited to:  - adverse reactions to medications - damage to eyes, teeth, lips or other oral mucosa - nerve damage due to positioning  - sore throat or hoarseness - Damage to heart, brain, nerves, lungs, other parts of body or loss of life  Patient voiced understanding.)  Anesthesia Quick Evaluation  

## 2020-09-23 NOTE — Anesthesia Procedure Notes (Signed)
Procedure Name: LMA Insertion Date/Time: 09/23/2020 11:35 AM Performed by: Biagio Borg, CRNA Pre-anesthesia Checklist: Patient identified, Emergency Drugs available, Suction available and Patient being monitored Patient Re-evaluated:Patient Re-evaluated prior to induction Oxygen Delivery Method: Circle system utilized Preoxygenation: Pre-oxygenation with 100% oxygen Induction Type: IV induction Ventilation: Mask ventilation without difficulty LMA: LMA inserted LMA Size: 4.5 Tube type: Oral Number of attempts: 1 Placement Confirmation: ETT inserted through vocal cords under direct vision, positive ETCO2 and breath sounds checked- equal and bilateral Tube secured with: Tape Dental Injury: Teeth and Oropharynx as per pre-operative assessment

## 2020-09-24 ENCOUNTER — Encounter: Payer: Self-pay | Admitting: Orthopedic Surgery

## 2020-09-24 NOTE — Anesthesia Postprocedure Evaluation (Signed)
Anesthesia Post Note  Patient: Scott Hickman  Procedure(s) Performed: RIGHT KNEE ARTHROSCOPY WITH MEDIAL MENISECTOMY (Right: Knee)  Patient location during evaluation: PACU Anesthesia Type: General Level of consciousness: awake and alert Pain management: pain level controlled Vital Signs Assessment: post-procedure vital signs reviewed and stable Respiratory status: spontaneous breathing, nonlabored ventilation, respiratory function stable and patient connected to nasal cannula oxygen Cardiovascular status: blood pressure returned to baseline and stable Postop Assessment: no apparent nausea or vomiting Anesthetic complications: no   No notable events documented.   Last Vitals:  Vitals:   09/23/20 1328 09/23/20 1338  BP: 116/78 139/75  Pulse: 64 60  Resp: 18 14  Temp: 36.4 C (!) 36.1 C  SpO2: 99% 99%    Last Pain:  Vitals:   09/23/20 1338  TempSrc: Temporal  PainSc: 0-No pain                 Precious Haws Jowel Waltner

## 2020-10-08 ENCOUNTER — Encounter (INDEPENDENT_AMBULATORY_CARE_PROVIDER_SITE_OTHER): Payer: BC Managed Care – PPO | Admitting: Ophthalmology

## 2020-10-08 ENCOUNTER — Other Ambulatory Visit: Payer: Self-pay

## 2020-10-08 DIAGNOSIS — H43813 Vitreous degeneration, bilateral: Secondary | ICD-10-CM | POA: Diagnosis not present

## 2020-10-08 DIAGNOSIS — I10 Essential (primary) hypertension: Secondary | ICD-10-CM

## 2020-10-08 DIAGNOSIS — H33302 Unspecified retinal break, left eye: Secondary | ICD-10-CM | POA: Diagnosis not present

## 2020-10-08 DIAGNOSIS — H35033 Hypertensive retinopathy, bilateral: Secondary | ICD-10-CM | POA: Diagnosis not present

## 2020-11-02 DIAGNOSIS — Z940 Kidney transplant status: Principal | ICD-10-CM

## 2020-11-02 DIAGNOSIS — Z79899 Other long term (current) drug therapy: Principal | ICD-10-CM

## 2020-11-03 ENCOUNTER — Ambulatory Visit: Admit: 2020-11-03 | Discharge: 2020-11-03 | Payer: PRIVATE HEALTH INSURANCE

## 2020-11-03 ENCOUNTER — Ambulatory Visit
Admit: 2020-11-03 | Discharge: 2020-11-03 | Payer: PRIVATE HEALTH INSURANCE | Attending: Nephrology | Primary: Nephrology

## 2020-11-03 DIAGNOSIS — Z79899 Other long term (current) drug therapy: Principal | ICD-10-CM

## 2020-11-03 DIAGNOSIS — Z940 Kidney transplant status: Principal | ICD-10-CM

## 2020-12-10 ENCOUNTER — Ambulatory Visit: Admit: 2020-12-10 | Discharge: 2020-12-11 | Payer: PRIVATE HEALTH INSURANCE

## 2020-12-24 ENCOUNTER — Ambulatory Visit: Admit: 2020-12-24 | Discharge: 2020-12-25 | Payer: PRIVATE HEALTH INSURANCE

## 2021-01-28 ENCOUNTER — Ambulatory Visit: Admit: 2021-01-28 | Discharge: 2021-01-29 | Payer: PRIVATE HEALTH INSURANCE

## 2021-03-03 ENCOUNTER — Ambulatory Visit: Admit: 2021-03-03 | Discharge: 2021-03-04 | Payer: PRIVATE HEALTH INSURANCE

## 2021-03-03 DIAGNOSIS — Z79899 Other long term (current) drug therapy: Principal | ICD-10-CM

## 2021-03-03 DIAGNOSIS — Z940 Kidney transplant status: Principal | ICD-10-CM

## 2021-03-04 ENCOUNTER — Ambulatory Visit
Admit: 2021-03-04 | Discharge: 2021-03-04 | Payer: PRIVATE HEALTH INSURANCE | Attending: Nephrology | Primary: Nephrology

## 2021-03-04 DIAGNOSIS — N529 Male erectile dysfunction, unspecified: Principal | ICD-10-CM

## 2021-03-04 DIAGNOSIS — Z940 Kidney transplant status: Principal | ICD-10-CM

## 2021-03-04 DIAGNOSIS — Z79899 Other long term (current) drug therapy: Principal | ICD-10-CM

## 2021-03-04 DIAGNOSIS — Z1211 Encounter for screening for malignant neoplasm of colon: Principal | ICD-10-CM

## 2021-03-04 MED ORDER — TADALAFIL 20 MG TABLET
ORAL_TABLET | 11 refills | 0 days | Status: CP
Start: 2021-03-04 — End: ?

## 2021-03-25 DIAGNOSIS — Z79899 Other long term (current) drug therapy: Principal | ICD-10-CM

## 2021-03-25 DIAGNOSIS — Z940 Kidney transplant status: Principal | ICD-10-CM

## 2021-04-26 ENCOUNTER — Ambulatory Visit: Admit: 2021-04-26 | Discharge: 2021-04-27 | Payer: PRIVATE HEALTH INSURANCE

## 2021-04-26 DIAGNOSIS — D1800 Hemangioma unspecified site: Principal | ICD-10-CM

## 2021-04-26 DIAGNOSIS — L738 Other specified follicular disorders: Principal | ICD-10-CM

## 2021-04-26 DIAGNOSIS — L820 Inflamed seborrheic keratosis: Principal | ICD-10-CM

## 2021-04-26 DIAGNOSIS — D229 Melanocytic nevi, unspecified: Principal | ICD-10-CM

## 2021-04-26 DIAGNOSIS — L814 Other melanin hyperpigmentation: Principal | ICD-10-CM

## 2021-04-26 DIAGNOSIS — L739 Follicular disorder, unspecified: Principal | ICD-10-CM

## 2021-04-26 MED ORDER — FLUOCINONIDE 0.05 % TOPICAL SOLUTION
3 refills | 0 days | Status: CP
Start: 2021-04-26 — End: ?

## 2021-04-26 MED ORDER — LIDOCAINE-PRILOCAINE 2.5 %-2.5 % TOPICAL CREAM
0 refills | 0 days | Status: CP
Start: 2021-04-26 — End: ?

## 2021-05-03 ENCOUNTER — Ambulatory Visit: Admit: 2021-05-03 | Discharge: 2021-05-04 | Payer: PRIVATE HEALTH INSURANCE

## 2021-05-09 MED ORDER — PEG 3350-ELECTROLYTES 236 GRAM-22.74 GRAM-6.74 GRAM-5.86 GRAM SOLUTION
ORAL | 0 refills | 0 days | Status: CP
Start: 2021-05-09 — End: ?

## 2021-05-18 MED FILL — PEG 3350-ELECTROLYTES 236 GRAM-22.74 GRAM-6.74 GRAM-5.86 GRAM SOLUTION: ORAL | 1 days supply | Qty: 4000 | Fill #0

## 2021-06-07 ENCOUNTER — Ambulatory Visit: Admit: 2021-06-07 | Discharge: 2021-06-07 | Payer: PRIVATE HEALTH INSURANCE

## 2021-06-07 ENCOUNTER — Encounter: Admit: 2021-06-07 | Discharge: 2021-06-07 | Payer: PRIVATE HEALTH INSURANCE

## 2021-06-21 ENCOUNTER — Ambulatory Visit: Admit: 2021-06-21 | Discharge: 2021-06-22 | Payer: PRIVATE HEALTH INSURANCE

## 2021-06-21 DIAGNOSIS — L709 Acne, unspecified: Principal | ICD-10-CM

## 2021-06-21 DIAGNOSIS — L739 Follicular disorder, unspecified: Principal | ICD-10-CM

## 2021-06-21 MED ORDER — TRETINOIN 0.05 % TOPICAL CREAM
3 refills | 0 days | Status: CP
Start: 2021-06-21 — End: ?

## 2021-06-21 MED ORDER — DOXYCYCLINE HYCLATE 20 MG TABLET
ORAL_TABLET | Freq: Every day | ORAL | 5 refills | 30 days | Status: CP
Start: 2021-06-21 — End: ?

## 2021-06-28 ENCOUNTER — Ambulatory Visit: Admit: 2021-06-28 | Discharge: 2021-06-29 | Payer: PRIVATE HEALTH INSURANCE

## 2021-06-29 DIAGNOSIS — Z79899 Other long term (current) drug therapy: Principal | ICD-10-CM

## 2021-06-29 DIAGNOSIS — Z940 Kidney transplant status: Principal | ICD-10-CM

## 2021-06-30 ENCOUNTER — Ambulatory Visit: Admit: 2021-06-30 | Discharge: 2021-07-01 | Payer: PRIVATE HEALTH INSURANCE

## 2021-06-30 DIAGNOSIS — Z79899 Other long term (current) drug therapy: Principal | ICD-10-CM

## 2021-06-30 DIAGNOSIS — Z940 Kidney transplant status: Principal | ICD-10-CM

## 2021-08-16 ENCOUNTER — Ambulatory Visit: Admit: 2021-08-16 | Discharge: 2021-08-17 | Payer: PRIVATE HEALTH INSURANCE

## 2021-08-18 ENCOUNTER — Ambulatory Visit: Admit: 2021-08-18 | Discharge: 2021-08-19 | Payer: PRIVATE HEALTH INSURANCE

## 2021-08-18 MED ORDER — VALACYCLOVIR 1 GRAM TABLET
ORAL_TABLET | Freq: Three times a day (TID) | ORAL | 0 refills | 7 days | Status: CP
Start: 2021-08-18 — End: 2022-08-18

## 2021-08-23 DIAGNOSIS — Z940 Kidney transplant status: Principal | ICD-10-CM

## 2021-08-30 MED ORDER — DOXEPIN 10 MG CAPSULE
ORAL_CAPSULE | Freq: Every evening | ORAL | 3 refills | 90 days | Status: CP
Start: 2021-08-30 — End: 2022-08-30

## 2021-08-31 DIAGNOSIS — Z940 Kidney transplant status: Principal | ICD-10-CM

## 2021-08-31 MED ORDER — MYCOPHENOLATE SODIUM 180 MG TABLET,DELAYED RELEASE
ORAL_TABLET | Freq: Two times a day (BID) | ORAL | 3 refills | 90 days | Status: CP
Start: 2021-08-31 — End: ?

## 2021-09-11 ENCOUNTER — Encounter: Payer: Self-pay | Admitting: Emergency Medicine

## 2021-09-11 ENCOUNTER — Emergency Department
Admission: EM | Admit: 2021-09-11 | Discharge: 2021-09-11 | Disposition: A | Discharge status: Home / Self Care | Payer: BC Managed Care – PPO | Attending: Emergency Medicine | Admitting: Emergency Medicine

## 2021-09-11 ENCOUNTER — Other Ambulatory Visit: Payer: Self-pay

## 2021-09-11 ENCOUNTER — Emergency Department: Payer: BC Managed Care – PPO

## 2021-09-11 DIAGNOSIS — N185 Chronic kidney disease, stage 5: Secondary | ICD-10-CM | POA: Diagnosis not present

## 2021-09-11 DIAGNOSIS — M545 Low back pain, unspecified: Secondary | ICD-10-CM | POA: Insufficient documentation

## 2021-09-11 DIAGNOSIS — Z940 Kidney transplant status: Secondary | ICD-10-CM | POA: Diagnosis not present

## 2021-09-11 DIAGNOSIS — G8911 Acute pain due to trauma: Secondary | ICD-10-CM | POA: Insufficient documentation

## 2021-09-11 DIAGNOSIS — X509XXA Other and unspecified overexertion or strenuous movements or postures, initial encounter: Secondary | ICD-10-CM | POA: Diagnosis not present

## 2021-09-11 DIAGNOSIS — I120 Hypertensive chronic kidney disease with stage 5 chronic kidney disease or end stage renal disease: Secondary | ICD-10-CM | POA: Insufficient documentation

## 2021-09-11 MED ORDER — CYCLOBENZAPRINE HCL 5 MG PO TABS: 5.0000 mg | ORAL_TABLET | Freq: Three times a day (TID) | ORAL | 0 refills | Status: AC | PRN

## 2021-09-11 MED ORDER — LIDOCAINE 5 % EX PTCH: 1.0000 | MEDICATED_PATCH | Freq: Two times a day (BID) | CUTANEOUS | 0 refills | Status: AC

## 2021-09-11 MED ORDER — DEXAMETHASONE SODIUM PHOSPHATE 10 MG/ML IJ SOLN
10.0000 mg | Freq: Once | INTRAMUSCULAR | Status: AC
  Administered 2021-09-11: 10 mg via INTRAMUSCULAR
  Filled 2021-09-11: qty 1

## 2021-09-11 MED ORDER — OXYCODONE-ACETAMINOPHEN 5-325 MG PO TABS
1.0000 | ORAL_TABLET | Freq: Once | ORAL | Status: AC
  Administered 2021-09-11: 1 via ORAL
  Filled 2021-09-11: qty 1

## 2021-09-11 MED ORDER — LIDOCAINE 5 % EX PTCH
1.0000 | MEDICATED_PATCH | CUTANEOUS | Status: DC
  Administered 2021-09-11: 1 via TRANSDERMAL
  Filled 2021-09-11: qty 1

## 2021-09-11 MED ORDER — CYCLOBENZAPRINE HCL 10 MG PO TABS
5.0000 mg | ORAL_TABLET | Freq: Once | ORAL | Status: AC
  Administered 2021-09-11: 5 mg via ORAL
  Filled 2021-09-11: qty 1

## 2021-09-11 NOTE — ED Provider Triage Note (Signed)
Emergency Medicine Provider Triage Evaluation Note  ELISANDRO JARRETT , a 46 y.o. male  was evaluated in triage.  Pt complains of low back pain.  Patient bent over to pick up sticks in the yard when his back seized up.  States he could not walk..  Review of Systems  Positive: Back pain Negative: Loss of bowel control  Physical Exam  BP 138/83 (BP Location: Right Arm)   Pulse 62   Temp 98.2 F (36.8 C) (Oral)   Resp 18   SpO2 96%  Gen:   Awake, no distress   Resp:  Normal effort  MSK:   Moves extremities without difficulty, 5/5 strength in lower extremities, lumbar spine Other:    Medical Decision Making  Medically screening exam initiated at 2:45 PM.  Appropriate orders placed.  ALEKZANDER CARDELL was informed that the remainder of the evaluation will be completed by another provider, this initial triage assessment does not replace that evaluation, and the importance of remaining in the ED until their evaluation is complete.  Stray lumbar spine Percocet provided in triage   Versie Starks, PA-C 09/11/21 1446

## 2021-09-11 NOTE — ED Notes (Signed)
Discharged by [facility] ED Medic

## 2021-09-11 NOTE — Discharge Instructions (Signed)
You may take the medications to help with your back pain.  Remember that the Flexeril can make you sleepy or impair your judgment, so do not drive, operate heavy machinery, or perform any test that require concentration while taking this medication.  Please return to the emergency department for any new, worsening, or changing symptoms or other concerns including weakness in your legs, urinary or stool incontinence or retention, numbness or tingling in your extremities/buttocks/groin, fevers, or any other concerns or change in symptoms.  It was a pleasure caring for you today.

## 2021-09-11 NOTE — ED Triage Notes (Signed)
Pt in via EMS form home. EMS reports pt was mowing grass went to pick up sticks and hurt his back. Pt was able to get to his porch and call for help. Pt is ok sitting but movement pain is much worse. Pt with fistula in left arm and hx of kidney transplant.

## 2021-09-11 NOTE — ED Provider Notes (Signed)
[facility] Provider Note    Event Date/Time   First MD Initiated Contact with Patient 09/11/21 1612     (approximate)   History   Back Pain   HPI  Scott Hickman is a 46 y.o. male with a past medical history of CKD from polycystic kidney disease status post kidney transplant who presents today for evaluation of low back pain.  Patient reports that he mowed the lawn this morning and then leaned over to pick up sticks and as he leaned over he felt a tightening across his low back.  He reports that the pain feels like he "locked up."  He denies any radiation of his pain.  He denies abdominal pain, radiation into his legs, numbness, tingling, or weakness.  He reports that he has no pain at rest and only with movement.  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      Excl. in [place]?     Most recent vital signs: Vitals:   09/11/21 1441  BP: 138/83  Pulse: 62  Resp: 18  Temp: 98.2 F (36.8 C)  SpO2: 96%    Physical Exam Vitals and nursing note reviewed.  Constitutional:      General: Awake and alert. No acute distress.    Appearance: Normal appearance. The patient is overweight.  HENT:     Head: Normocephalic and atraumatic.     Mouth: Mucous membranes are moist.  Eyes:     General: PERRL. Normal EOMs         Right eye: No discharge.        Left eye: No discharge.     Conjunctiva/sclera: Conjunctivae normal.  Cardiovascular:     Rate and Rhythm: Normal rate and regular rhythm.     Pulses: Normal pulses.     Heart sounds: Normal heart sounds Pulmonary:     Effort: Pulmonary effort is normal. No respiratory distress.     Breath sounds: Normal breath sounds.  Abdominal:     Abdomen is soft. There is no abdominal tenderness. No rebound or guarding. No distention. Well healed surgical scars present Back: No midline tenderness.  Diffuse lumbar paraspinal muscle tenderness.  Strength and sensation 5/5 to bilateral lower extremities. Normal great toe extension against resistance. Normal sensation throughout feet. Normal patellar reflexes. Negative SLR and opposite SLR bilaterally. Negative FABER test Musculoskeletal:        General: No swelling. Normal range of motion.     Cervical back: Normal range of motion and neck  supple.  Skin:    General: Skin is warm and dry.     Capillary Refill: Capillary refill takes less than 2 seconds.     Findings: No rash.  Neurological:     Mental Status: The patient is awake and alert.      ED Results / Procedures / Treatments   Labs (all labs ordered are listed, but only abnormal results are displayed) Labs Reviewed - No data to display   EKG     RADIOLOGY I independently reviewed and interpreted imaging and agree with radiologists findings.     PROCEDURES:  Critical Care performed:   Procedures   MEDICATIONS ORDERED IN ED: Medications  lidocaine (LIDODERM) 5 % 1 patch (1 patch Transdermal Patch Applied 09/11/21 1652)  oxyCODONE-acetaminophen (PERCOCET/ROXICET) 5-325 MG per tablet 1 tablet (1 tablet Oral Given 09/11/21 1447)  cyclobenzaprine (FLEXERIL) tablet 5 mg (5 mg Oral Given 09/11/21 1652)  dexamethasone (DECADRON) injection 10 mg (10 mg Intramuscular Given 09/11/21 1652)     IMPRESSION / MDM / ASSESSMENT AND PLAN / ED COURSE   I reviewed the triage vital signs and the nursing notes.   Differential diagnosis includes, but is not limited to, muscle spasm, lumbar radiculopathy, fracture, SI joint disease.  Patient presents emergency department awake and alert, hemodynamically stable and afebrile.  He is nontoxic in appearance.  He has 5 out of 5 strength with intact sensation to extensor hallucis dorsiflexion and plantarflexion of bilateral lower extremities with normal patellar reflexes bilaterally. Most likely etiology at this point is muscle strain vs herniated disc. No red flags to indicate patient is at risk for more auspicious process that would require urgent/emergent spinal imaging or subspecialty evaluation at this time. No major trauma, no midline tenderness, no history or physical exam findings to suggest cauda equina syndrome or spinal cord compression. No focal neurological deficits on exam. No constitutional symptoms to suggest potential for epidural abscess. Not anticoagulated, no history of bleeding diastasis to suggest risk for epidural hematoma. No chronic steroid use or advanced age or history of malignancy to suggest proclivity towards pathological fracture.  No abdominal pain or flank pain to suggest kidney stone, no history of kidney stone.  No abdominal pain or tenderness to suggest problem with the stone or kidney.  No fever or dysuria or CVAT to suggest pyelonephritis .  No chest pain, back pain, shortness of breath, neurological deficits, to suggest vascular catastrophe, and pulses are equal in all 4 extremities.  He was treated with Flexeril, Tylenol, and oxycodone which was given to him while he was in the waiting room with improvement of his symptoms.  XR ordered at triage demonstrates degenerative changes without acute osseous injury. He was able to ambulate unassisted with a normal steady gait.  No urinary retention on postvoid bladder scan.  Discussed care instructions and return precautions with patient.  Recommended close outpatient follow-up for re-evaluation. Patient agrees with plan of care. Will treat the patient symptomatically as needed for pain control. Will discharge patient to take these medications and return for any worsening or different pain or development of any neurologic symptoms. Educated patient regarding expected time course for back pain to improve and recommended very close outpatient follow-up.  He was also advised that he cannot drive, operate heavy machinery, or perform any test that require concentration while taking the Flexeril.  He understands and agrees.    Patient's presentation is most consistent with acute complicated illness / injury requiring diagnostic workup.   Clinical Course  as of 09/11/21 2007  Sun Sep 11, 2021  1824 Ambulatory with a steady gait, unassisted [JP]    Clinical Course User Index [JP] Britanny Marksberry, Clarnce Flock, PA-C     FINAL CLINICAL IMPRESSION(S) / ED DIAGNOSES   Final diagnoses:  Acute bilateral low back pain without sciatica     Rx / DC Orders   ED Discharge Orders          Ordered    cyclobenzaprine (FLEXERIL) 5 MG tablet  3 times daily PRN        09/11/21 1834    lidocaine (LIDODERM) 5 %  Every 12 hours        09/11/21 1834             Note:  This document was prepared using Dragon voice recognition software and may include unintentional dictation errors.   Emeline Gins 09/11/21 2007    Nena Polio, MD 09/11/21 2009

## 2021-09-11 NOTE — ED Triage Notes (Signed)
Pt reports back pain after picking up sticks. Pt reports pain is worse with movement. Pt able to move legs freely and has sensation on bilateral lower extremities.

## 2021-09-14 DIAGNOSIS — D849 Immunodeficiency, unspecified: Principal | ICD-10-CM

## 2021-09-14 DIAGNOSIS — Z940 Kidney transplant status: Principal | ICD-10-CM

## 2021-09-14 MED ORDER — ESCITALOPRAM 10 MG TABLET
ORAL_TABLET | Freq: Every day | ORAL | 3 refills | 90 days | Status: CP
Start: 2021-09-14 — End: 2022-09-14

## 2021-09-14 MED ORDER — TACROLIMUS 0.5 MG CAPSULE, IMMEDIATE-RELEASE
ORAL_CAPSULE | Freq: Two times a day (BID) | ORAL | 3 refills | 90 days | Status: CP
Start: 2021-09-14 — End: ?

## 2021-09-20 ENCOUNTER — Ambulatory Visit: Admit: 2021-09-20 | Discharge: 2021-09-21 | Payer: PRIVATE HEALTH INSURANCE

## 2021-11-03 MED ORDER — DOXYCYCLINE HYCLATE 100 MG TABLET
ORAL_TABLET | Freq: Two times a day (BID) | ORAL | 1 refills | 30.00000 days | Status: CP
Start: 2021-11-03 — End: 2022-01-02

## 2021-11-09 DIAGNOSIS — Z79899 Other long term (current) drug therapy: Principal | ICD-10-CM

## 2021-11-09 DIAGNOSIS — Z940 Kidney transplant status: Principal | ICD-10-CM

## 2021-11-10 ENCOUNTER — Ambulatory Visit: Admit: 2021-11-10 | Discharge: 2021-11-10 | Payer: PRIVATE HEALTH INSURANCE

## 2021-11-10 DIAGNOSIS — I151 Hypertension secondary to other renal disorders: Principal | ICD-10-CM

## 2021-11-10 DIAGNOSIS — Z79899 Other long term (current) drug therapy: Principal | ICD-10-CM

## 2021-11-10 DIAGNOSIS — Z940 Kidney transplant status: Principal | ICD-10-CM

## 2021-11-10 DIAGNOSIS — Q612 Polycystic kidney, adult type: Principal | ICD-10-CM

## 2021-11-11 ENCOUNTER — Ambulatory Visit: Admit: 2021-11-11 | Discharge: 2021-11-12 | Payer: PRIVATE HEALTH INSURANCE

## 2021-12-02 ENCOUNTER — Ambulatory Visit: Admit: 2021-12-02 | Discharge: 2021-12-03 | Payer: PRIVATE HEALTH INSURANCE

## 2021-12-26 MED ORDER — LOSARTAN 50 MG TABLET
ORAL_TABLET | Freq: Two times a day (BID) | ORAL | 3 refills | 90 days | Status: CP
Start: 2021-12-26 — End: 2022-12-26

## 2022-01-06 ENCOUNTER — Ambulatory Visit: Admit: 2022-01-06 | Discharge: 2022-01-07 | Payer: PRIVATE HEALTH INSURANCE

## 2022-02-22 ENCOUNTER — Ambulatory Visit: Admit: 2022-02-22 | Discharge: 2022-02-22 | Payer: PRIVATE HEALTH INSURANCE

## 2022-02-22 DIAGNOSIS — L709 Acne, unspecified: Principal | ICD-10-CM

## 2022-02-22 DIAGNOSIS — D492 Neoplasm of unspecified behavior of bone, soft tissue, and skin: Principal | ICD-10-CM

## 2022-02-22 DIAGNOSIS — L821 Other seborrheic keratosis: Principal | ICD-10-CM

## 2022-02-22 MED ORDER — MINOCYCLINE 100 MG CAPSULE
ORAL_CAPSULE | Freq: Every day | ORAL | 2 refills | 30.00000 days | Status: CP
Start: 2022-02-22 — End: 2022-05-23

## 2022-03-01 DIAGNOSIS — D631 Anemia in chronic kidney disease: Principal | ICD-10-CM

## 2022-03-01 DIAGNOSIS — Z79899 Other long term (current) drug therapy: Principal | ICD-10-CM

## 2022-03-01 DIAGNOSIS — Z940 Kidney transplant status: Principal | ICD-10-CM

## 2022-03-01 DIAGNOSIS — N182 Chronic kidney disease, stage 2 (mild): Principal | ICD-10-CM

## 2022-03-10 ENCOUNTER — Ambulatory Visit: Admit: 2022-03-10 | Discharge: 2022-03-10 | Payer: PRIVATE HEALTH INSURANCE

## 2022-03-10 DIAGNOSIS — D631 Anemia in chronic kidney disease: Principal | ICD-10-CM

## 2022-03-10 DIAGNOSIS — R5383 Other fatigue: Principal | ICD-10-CM

## 2022-03-10 DIAGNOSIS — I151 Hypertension secondary to other renal disorders: Principal | ICD-10-CM

## 2022-03-10 DIAGNOSIS — N182 Chronic kidney disease, stage 2 (mild): Principal | ICD-10-CM

## 2022-03-10 DIAGNOSIS — Z79899 Other long term (current) drug therapy: Principal | ICD-10-CM

## 2022-03-10 DIAGNOSIS — Z940 Kidney transplant status: Principal | ICD-10-CM

## 2022-03-24 DIAGNOSIS — Z940 Kidney transplant status: Principal | ICD-10-CM

## 2022-03-24 DIAGNOSIS — Z79899 Other long term (current) drug therapy: Principal | ICD-10-CM

## 2022-03-24 DIAGNOSIS — N529 Male erectile dysfunction, unspecified: Principal | ICD-10-CM

## 2022-03-24 DIAGNOSIS — R5383 Other fatigue: Principal | ICD-10-CM

## 2022-03-24 MED ORDER — TADALAFIL 20 MG TABLET
ORAL_TABLET | 11 refills | 0 days | Status: CP
Start: 2022-03-24 — End: ?

## 2022-04-02 DIAGNOSIS — Z940 Kidney transplant status: Principal | ICD-10-CM

## 2022-04-02 DIAGNOSIS — Z79899 Other long term (current) drug therapy: Principal | ICD-10-CM

## 2022-05-04 DIAGNOSIS — Z940 Kidney transplant status: Principal | ICD-10-CM

## 2022-05-12 ENCOUNTER — Ambulatory Visit: Admit: 2022-05-12 | Discharge: 2022-05-12 | Payer: PRIVATE HEALTH INSURANCE

## 2022-05-12 DIAGNOSIS — L739 Follicular disorder, unspecified: Principal | ICD-10-CM

## 2022-05-12 DIAGNOSIS — L709 Acne, unspecified: Principal | ICD-10-CM

## 2022-05-12 DIAGNOSIS — Z79899 Other long term (current) drug therapy: Principal | ICD-10-CM

## 2022-05-17 DIAGNOSIS — L739 Follicular disorder, unspecified: Principal | ICD-10-CM

## 2022-05-17 MED ORDER — CEFADROXIL 500 MG CAPSULE
ORAL_CAPSULE | Freq: Two times a day (BID) | ORAL | 0 refills | 10.00000 days | Status: CP
Start: 2022-05-17 — End: ?

## 2022-06-30 ENCOUNTER — Ambulatory Visit: Admit: 2022-06-30 | Discharge: 2022-07-01 | Payer: PRIVATE HEALTH INSURANCE

## 2022-08-28 DIAGNOSIS — N1832 Anemia of chronic renal failure, stage 3b (CMS-HCC): Principal | ICD-10-CM

## 2022-08-28 DIAGNOSIS — Z940 Kidney transplant status: Principal | ICD-10-CM

## 2022-08-28 DIAGNOSIS — D631 Anemia in chronic kidney disease: Principal | ICD-10-CM

## 2022-08-28 DIAGNOSIS — Z79899 Other long term (current) drug therapy: Principal | ICD-10-CM

## 2022-08-30 ENCOUNTER — Ambulatory Visit: Admit: 2022-08-30 | Discharge: 2022-08-31 | Payer: PRIVATE HEALTH INSURANCE

## 2022-09-01 ENCOUNTER — Ambulatory Visit: Admit: 2022-09-01 | Discharge: 2022-09-02 | Payer: PRIVATE HEALTH INSURANCE

## 2022-09-01 DIAGNOSIS — N185 Chronic kidney disease, stage 5: Principal | ICD-10-CM

## 2022-09-01 DIAGNOSIS — D631 Anemia in chronic kidney disease: Principal | ICD-10-CM

## 2022-09-01 DIAGNOSIS — N1832 Anemia in stage 3b chronic kidney disease (CMS-HCC): Principal | ICD-10-CM

## 2022-09-01 DIAGNOSIS — R5383 Other fatigue: Principal | ICD-10-CM

## 2022-09-01 DIAGNOSIS — Z79899 Other long term (current) drug therapy: Principal | ICD-10-CM

## 2022-09-01 DIAGNOSIS — Z23 Encounter for immunization: Principal | ICD-10-CM

## 2022-09-01 DIAGNOSIS — Z940 Kidney transplant status: Principal | ICD-10-CM

## 2022-09-01 DIAGNOSIS — I151 Hypertension secondary to other renal disorders: Principal | ICD-10-CM

## 2022-09-15 ENCOUNTER — Institutional Professional Consult (permissible substitution): Admit: 2022-09-15 | Discharge: 2022-09-15 | Payer: PRIVATE HEALTH INSURANCE

## 2022-09-15 ENCOUNTER — Ambulatory Visit: Admit: 2022-09-15 | Discharge: 2022-09-15 | Payer: PRIVATE HEALTH INSURANCE

## 2022-09-15 DIAGNOSIS — R5383 Other fatigue: Principal | ICD-10-CM

## 2022-09-15 DIAGNOSIS — D631 Anemia in chronic kidney disease: Principal | ICD-10-CM

## 2022-09-15 DIAGNOSIS — N1832 Anemia in stage 3b chronic kidney disease (CMS-HCC): Principal | ICD-10-CM

## 2022-09-15 DIAGNOSIS — Z940 Kidney transplant status: Principal | ICD-10-CM

## 2022-09-15 DIAGNOSIS — N185 Chronic kidney disease, stage 5: Principal | ICD-10-CM

## 2022-09-19 DIAGNOSIS — D849 Immunodeficiency, unspecified: Principal | ICD-10-CM

## 2022-09-19 DIAGNOSIS — Z940 Kidney transplant status: Principal | ICD-10-CM

## 2022-09-19 MED ORDER — ESCITALOPRAM 10 MG TABLET
ORAL_TABLET | Freq: Every day | ORAL | 3 refills | 90 days | Status: CP
Start: 2022-09-19 — End: ?

## 2022-09-19 MED ORDER — TACROLIMUS 0.5 MG CAPSULE, IMMEDIATE-RELEASE
ORAL_CAPSULE | 3 refills | 0 days | Status: CP
Start: 2022-09-19 — End: ?

## 2022-09-19 MED ORDER — DOXEPIN 10 MG CAPSULE
ORAL_CAPSULE | Freq: Every evening | ORAL | 3 refills | 90 days | Status: CP
Start: 2022-09-19 — End: 2023-09-19

## 2022-09-22 ENCOUNTER — Ambulatory Visit: Admit: 2022-09-22 | Discharge: 2022-09-22 | Payer: PRIVATE HEALTH INSURANCE

## 2022-09-22 ENCOUNTER — Institutional Professional Consult (permissible substitution): Admit: 2022-09-22 | Discharge: 2022-09-22 | Payer: PRIVATE HEALTH INSURANCE

## 2022-09-22 DIAGNOSIS — N185 Chronic kidney disease, stage 5: Principal | ICD-10-CM

## 2022-09-22 DIAGNOSIS — N1832 Anemia in stage 3b chronic kidney disease (CMS-HCC): Principal | ICD-10-CM

## 2022-09-22 DIAGNOSIS — D631 Anemia in chronic kidney disease: Principal | ICD-10-CM

## 2022-09-22 DIAGNOSIS — Z940 Kidney transplant status: Principal | ICD-10-CM

## 2022-09-27 DIAGNOSIS — Z940 Kidney transplant status: Principal | ICD-10-CM

## 2022-09-27 MED ORDER — MYCOPHENOLATE SODIUM 180 MG TABLET,DELAYED RELEASE
ORAL_TABLET | Freq: Two times a day (BID) | ORAL | 3 refills | 90 days | Status: CP
Start: 2022-09-27 — End: ?

## 2022-09-27 MED ORDER — LOSARTAN 50 MG TABLET
ORAL | 3 refills | 90 days | Status: CP
Start: 2022-09-27 — End: 2023-09-27

## 2022-10-05 ENCOUNTER — Ambulatory Visit: Admit: 2022-10-05 | Discharge: 2022-10-05 | Payer: PRIVATE HEALTH INSURANCE

## 2022-10-27 ENCOUNTER — Ambulatory Visit: Admit: 2022-10-27 | Discharge: 2022-10-28 | Payer: PRIVATE HEALTH INSURANCE

## 2022-12-15 ENCOUNTER — Ambulatory Visit: Admit: 2022-12-15 | Discharge: 2022-12-16 | Payer: BLUE CROSS/BLUE SHIELD

## 2023-01-20 DIAGNOSIS — Z940 Kidney transplant status: Principal | ICD-10-CM

## 2023-02-02 ENCOUNTER — Ambulatory Visit: Admit: 2023-02-02 | Discharge: 2023-02-03 | Payer: BLUE CROSS/BLUE SHIELD

## 2023-03-08 DIAGNOSIS — E559 Vitamin D deficiency, unspecified: Principal | ICD-10-CM

## 2023-03-08 DIAGNOSIS — Z940 Kidney transplant status: Principal | ICD-10-CM

## 2023-03-08 DIAGNOSIS — D631 Anemia in chronic kidney disease: Principal | ICD-10-CM

## 2023-03-08 DIAGNOSIS — Z79899 Other long term (current) drug therapy: Principal | ICD-10-CM

## 2023-03-08 DIAGNOSIS — N1832 Anemia of chronic renal failure, stage 3b (CMS-HCC): Principal | ICD-10-CM

## 2023-03-09 ENCOUNTER — Ambulatory Visit: Admit: 2023-03-09 | Discharge: 2023-03-10 | Payer: BLUE CROSS/BLUE SHIELD

## 2023-03-09 DIAGNOSIS — Z940 Kidney transplant status: Principal | ICD-10-CM

## 2023-03-09 DIAGNOSIS — R5383 Other fatigue: Principal | ICD-10-CM

## 2023-03-09 DIAGNOSIS — Z79899 Other long term (current) drug therapy: Principal | ICD-10-CM

## 2023-03-09 DIAGNOSIS — D631 Anemia in chronic kidney disease: Principal | ICD-10-CM

## 2023-03-09 DIAGNOSIS — E559 Vitamin D deficiency, unspecified: Principal | ICD-10-CM

## 2023-03-09 DIAGNOSIS — N1832 Anemia of chronic renal failure, stage 3b (CMS-HCC): Principal | ICD-10-CM

## 2023-03-09 DIAGNOSIS — I151 Hypertension secondary to other renal disorders: Principal | ICD-10-CM

## 2023-03-30 ENCOUNTER — Ambulatory Visit: Admit: 2023-03-30 | Discharge: 2023-03-31 | Payer: BLUE CROSS/BLUE SHIELD

## 2023-03-30 DIAGNOSIS — F3289 Other specified depressive episodes: Principal | ICD-10-CM

## 2023-03-30 DIAGNOSIS — I10 Essential (primary) hypertension: Principal | ICD-10-CM

## 2023-03-30 DIAGNOSIS — Q612 Polycystic kidney, adult type: Principal | ICD-10-CM

## 2023-03-30 DIAGNOSIS — R5383 Other fatigue: Principal | ICD-10-CM

## 2023-03-30 DIAGNOSIS — D84821 Immunodeficiency due to drugs (CODE) (CMS-HCC): Principal | ICD-10-CM

## 2023-03-30 MED ORDER — BUPROPION HCL XL 150 MG 24 HR TABLET, EXTENDED RELEASE
ORAL_TABLET | Freq: Every morning | ORAL | 2 refills | 30.00 days | Status: CP
Start: 2023-03-30 — End: 2024-03-29

## 2023-04-03 DIAGNOSIS — Z940 Kidney transplant status: Principal | ICD-10-CM

## 2023-04-05 DIAGNOSIS — Z940 Kidney transplant status: Principal | ICD-10-CM

## 2023-04-06 ENCOUNTER — Ambulatory Visit
Admit: 2023-04-06 | Discharge: 2023-04-07 | Payer: BLUE CROSS/BLUE SHIELD | Attending: Student in an Organized Health Care Education/Training Program | Primary: Student in an Organized Health Care Education/Training Program

## 2023-04-06 DIAGNOSIS — R5383 Other fatigue: Principal | ICD-10-CM

## 2023-04-06 DIAGNOSIS — E66812 Class 2 severe obesity with serious comorbidity and body mass index (BMI) of 35.0 to 35.9 in adult, unspecified obesity type (CMS-HCC): Principal | ICD-10-CM

## 2023-04-06 DIAGNOSIS — Z6835 Body mass index (BMI) 35.0-35.9, adult: Principal | ICD-10-CM

## 2023-04-06 DIAGNOSIS — Z79899 Other long term (current) drug therapy: Principal | ICD-10-CM

## 2023-04-06 DIAGNOSIS — Z940 Kidney transplant status: Principal | ICD-10-CM

## 2023-04-20 ENCOUNTER — Ambulatory Visit: Admit: 2023-04-20 | Discharge: 2023-04-21

## 2023-04-20 DIAGNOSIS — I10 Essential (primary) hypertension: Principal | ICD-10-CM

## 2023-04-20 DIAGNOSIS — Q612 Polycystic kidney, adult type: Principal | ICD-10-CM

## 2023-04-20 DIAGNOSIS — R5382 Chronic fatigue, unspecified: Principal | ICD-10-CM

## 2023-04-20 DIAGNOSIS — D84821 Immunodeficiency due to drugs (CODE): Principal | ICD-10-CM

## 2023-04-20 DIAGNOSIS — E669 Obesity, unspecified: Principal | ICD-10-CM

## 2023-04-20 DIAGNOSIS — Z940 Kidney transplant status: Principal | ICD-10-CM

## 2023-04-20 DIAGNOSIS — F3289 Other specified depressive episodes: Principal | ICD-10-CM

## 2023-04-20 MED ORDER — BUPROPION HCL XL 150 MG 24 HR TABLET, EXTENDED RELEASE
ORAL_TABLET | Freq: Every morning | ORAL | 3 refills | 90 days | Status: CP
Start: 2023-04-20 — End: 2024-04-19

## 2023-04-20 MED ORDER — MOUNJARO 2.5 MG/0.5 ML SUBCUTANEOUS PEN INJECTOR
SUBCUTANEOUS | 0 refills | 28 days | Status: CP
Start: 2023-04-20 — End: 2023-05-12

## 2023-05-04 ENCOUNTER — Ambulatory Visit
Admit: 2023-05-04 | Discharge: 2023-05-05 | Payer: BLUE CROSS/BLUE SHIELD | Attending: Student in an Organized Health Care Education/Training Program | Primary: Student in an Organized Health Care Education/Training Program

## 2023-05-04 DIAGNOSIS — N182 Chronic kidney disease, stage 2 (mild): Principal | ICD-10-CM

## 2023-05-04 DIAGNOSIS — Z6835 Body mass index (BMI) 35.0-35.9, adult: Principal | ICD-10-CM

## 2023-05-04 DIAGNOSIS — E66812 Class 2 severe obesity with serious comorbidity and body mass index (BMI) of 35.0 to 35.9 in adult, unspecified obesity type (CMS-HCC): Principal | ICD-10-CM

## 2023-05-04 DIAGNOSIS — I10 Essential (primary) hypertension: Principal | ICD-10-CM

## 2023-05-04 DIAGNOSIS — R5383 Other fatigue: Principal | ICD-10-CM

## 2023-05-04 MED ORDER — WEGOVY 0.25 MG/0.5 ML SUBCUTANEOUS PEN INJECTOR
0 refills | 0.00 days | Status: CP
Start: 2023-05-04 — End: ?

## 2023-05-18 MED ORDER — MOUNJARO 5 MG/0.5 ML SUBCUTANEOUS PEN INJECTOR
SUBCUTANEOUS | 0 refills | 0 days | Status: CP
Start: 2023-05-18 — End: 2023-06-09

## 2023-05-21 ENCOUNTER — Encounter: Admit: 2023-05-21 | Discharge: 2023-05-22 | Payer: BLUE CROSS/BLUE SHIELD

## 2023-05-21 DIAGNOSIS — E66812 Class 2 severe obesity with serious comorbidity and body mass index (BMI) of 35.0 to 35.9 in adult, unspecified obesity type (CMS-HCC): Principal | ICD-10-CM

## 2023-05-21 DIAGNOSIS — Z6835 Body mass index (BMI) 35.0-35.9, adult: Principal | ICD-10-CM

## 2023-05-21 DIAGNOSIS — G4709 Other insomnia: Principal | ICD-10-CM

## 2023-05-21 DIAGNOSIS — F32A Depression, unspecified depression type: Principal | ICD-10-CM

## 2023-05-21 MED ORDER — ESCITALOPRAM 20 MG TABLET
ORAL_TABLET | Freq: Every day | ORAL | 11 refills | 30.00000 days | Status: CP
Start: 2023-05-21 — End: 2024-05-20

## 2023-06-01 MED ORDER — WEGOVY 0.5 MG/0.5 ML SUBCUTANEOUS PEN INJECTOR
1 refills | 0.00 days | Status: CP
Start: 2023-06-01 — End: ?

## 2023-06-04 DIAGNOSIS — Z940 Kidney transplant status: Principal | ICD-10-CM

## 2023-06-04 DIAGNOSIS — N529 Male erectile dysfunction, unspecified: Principal | ICD-10-CM

## 2023-06-04 MED ORDER — TADALAFIL 20 MG TABLET
ORAL_TABLET | ORAL | 3 refills | 0.00000 days | Status: CP
Start: 2023-06-04 — End: ?

## 2023-06-04 MED ORDER — AMLODIPINE 2.5 MG TABLET
ORAL_TABLET | Freq: Every day | ORAL | 3 refills | 100.00000 days | Status: CP
Start: 2023-06-04 — End: 2024-07-08

## 2023-06-11 ENCOUNTER — Ambulatory Visit: Admit: 2023-06-11 | Discharge: 2023-06-12 | Payer: BLUE CROSS/BLUE SHIELD | Attending: Family | Primary: Family

## 2023-06-11 DIAGNOSIS — R070 Pain in throat: Principal | ICD-10-CM

## 2023-06-11 DIAGNOSIS — R059 Cough, unspecified type: Principal | ICD-10-CM

## 2023-06-11 DIAGNOSIS — J069 Acute upper respiratory infection, unspecified: Principal | ICD-10-CM

## 2023-06-11 MED ORDER — BENZONATATE 100 MG CAPSULE
ORAL_CAPSULE | Freq: Three times a day (TID) | ORAL | 0 refills | 7.00000 days | Status: CP | PRN
Start: 2023-06-11 — End: 2023-06-18

## 2023-06-11 MED ORDER — ALBUTEROL SULFATE HFA 90 MCG/ACTUATION AEROSOL INHALER
Freq: Four times a day (QID) | RESPIRATORY_TRACT | 0 refills | 0.00000 days | Status: CP | PRN
Start: 2023-06-11 — End: 2024-06-10

## 2023-06-11 MED ORDER — PREDNISONE 20 MG TABLET
ORAL_TABLET | Freq: Every day | ORAL | 0 refills | 3.00000 days | Status: CP
Start: 2023-06-11 — End: 2023-06-14

## 2023-06-15 ENCOUNTER — Ambulatory Visit: Admit: 2023-06-15 | Discharge: 2023-06-16 | Payer: BLUE CROSS/BLUE SHIELD

## 2023-06-15 DIAGNOSIS — Z940 Kidney transplant status: Principal | ICD-10-CM

## 2023-06-15 MED ORDER — MOUNJARO 7.5 MG/0.5 ML SUBCUTANEOUS PEN INJECTOR
SUBCUTANEOUS | 0 refills | 0 days | Status: CP
Start: 2023-06-15 — End: 2023-07-07

## 2023-06-19 ENCOUNTER — Inpatient Hospital Stay: Admit: 2023-06-19 | Discharge: 2023-06-20 | Payer: BLUE CROSS/BLUE SHIELD

## 2023-06-19 ENCOUNTER — Ambulatory Visit: Admit: 2023-06-19 | Discharge: 2023-06-20 | Payer: BLUE CROSS/BLUE SHIELD

## 2023-06-19 ENCOUNTER — Ambulatory Visit: Admit: 2023-06-19 | Discharge: 2023-06-20 | Payer: BLUE CROSS/BLUE SHIELD | Attending: Medical | Primary: Medical

## 2023-06-19 DIAGNOSIS — J029 Acute pharyngitis, unspecified: Principal | ICD-10-CM

## 2023-06-19 DIAGNOSIS — J988 Other specified respiratory disorders: Principal | ICD-10-CM

## 2023-06-19 DIAGNOSIS — Z940 Kidney transplant status: Principal | ICD-10-CM

## 2023-06-19 DIAGNOSIS — B9689 Other specified bacterial agents as the cause of diseases classified elsewhere: Principal | ICD-10-CM

## 2023-06-19 DIAGNOSIS — R051 Acute cough: Principal | ICD-10-CM

## 2023-06-19 MED ORDER — BENZONATATE 100 MG CAPSULE
ORAL_CAPSULE | Freq: Three times a day (TID) | ORAL | 0 refills | 7.00000 days | Status: CP | PRN
Start: 2023-06-19 — End: 2023-06-26

## 2023-06-19 MED ORDER — DOXYCYCLINE HYCLATE 100 MG CAPSULE
ORAL_CAPSULE | Freq: Two times a day (BID) | ORAL | 0 refills | 7.00000 days | Status: CP
Start: 2023-06-19 — End: 2023-06-19

## 2023-06-19 MED ORDER — LIDOCAINE HCL 2 % MUCOSAL SOLUTION
0 refills | 0.00000 days | Status: CP
Start: 2023-06-19 — End: ?

## 2023-08-13 ENCOUNTER — Ambulatory Visit: Admit: 2023-08-13 | Discharge: 2023-08-14 | Payer: BLUE CROSS/BLUE SHIELD

## 2023-08-13 ENCOUNTER — Encounter: Admit: 2023-08-13 | Discharge: 2023-08-14 | Payer: BLUE CROSS/BLUE SHIELD

## 2023-08-13 DIAGNOSIS — Z940 Kidney transplant status: Principal | ICD-10-CM

## 2023-09-19 DIAGNOSIS — Z79899 Other long term (current) drug therapy: Principal | ICD-10-CM

## 2023-09-19 DIAGNOSIS — Z940 Kidney transplant status: Principal | ICD-10-CM

## 2023-09-21 ENCOUNTER — Ambulatory Visit: Admit: 2023-09-21 | Discharge: 2023-09-21 | Payer: BLUE CROSS/BLUE SHIELD

## 2023-09-21 ENCOUNTER — Inpatient Hospital Stay: Admit: 2023-09-21 | Discharge: 2023-09-21 | Payer: BLUE CROSS/BLUE SHIELD

## 2023-09-21 DIAGNOSIS — Z79899 Other long term (current) drug therapy: Principal | ICD-10-CM

## 2023-09-21 DIAGNOSIS — Z940 Kidney transplant status: Principal | ICD-10-CM

## 2023-09-21 DIAGNOSIS — N182 Chronic kidney disease, stage 2 (mild): Principal | ICD-10-CM

## 2023-09-21 DIAGNOSIS — Z6834 Body mass index (BMI) 34.0-34.9, adult: Principal | ICD-10-CM

## 2023-09-21 DIAGNOSIS — D849 Immunodeficiency, unspecified: Principal | ICD-10-CM

## 2023-09-21 DIAGNOSIS — N529 Male erectile dysfunction, unspecified: Principal | ICD-10-CM

## 2023-09-21 DIAGNOSIS — N1831 Stage 3a chronic kidney disease (CMS-HCC): Principal | ICD-10-CM

## 2023-09-21 MED ORDER — WEGOVY 0.25 MG/0.5 ML SUBCUTANEOUS PEN INJECTOR
SUBCUTANEOUS | 0 refills | 118.00000 days | Status: CP
Start: 2023-09-21 — End: 2024-01-17

## 2023-09-21 MED ORDER — TADALAFIL 20 MG TABLET
ORAL_TABLET | Freq: Every day | ORAL | 3 refills | 20.00000 days | Status: CP | PRN
Start: 2023-09-21 — End: 2024-09-20

## 2023-09-21 MED ORDER — TACROLIMUS 0.5 MG CAPSULE, IMMEDIATE-RELEASE
ORAL_CAPSULE | Freq: Two times a day (BID) | ORAL | 3 refills | 90.00000 days | Status: CP
Start: 2023-09-21 — End: 2024-09-20

## 2023-09-21 MED ORDER — LOSARTAN 50 MG TABLET
ORAL_TABLET | Freq: Two times a day (BID) | ORAL | 3 refills | 90.00000 days | Status: CP
Start: 2023-09-21 — End: 2024-09-20

## 2023-09-21 MED ORDER — MYCOPHENOLATE SODIUM 180 MG TABLET,DELAYED RELEASE
ORAL_TABLET | Freq: Two times a day (BID) | ORAL | 3 refills | 90.00000 days | Status: CP
Start: 2023-09-21 — End: 2024-09-20

## 2023-09-21 MED ORDER — AMLODIPINE 2.5 MG TABLET
ORAL_TABLET | Freq: Every day | ORAL | 3 refills | 100.00000 days | Status: CP
Start: 2023-09-21 — End: 2024-10-25

## 2023-09-27 DIAGNOSIS — Z940 Kidney transplant status: Principal | ICD-10-CM

## 2023-09-28 DIAGNOSIS — N182 Chronic kidney disease, stage 2 (mild): Principal | ICD-10-CM

## 2023-09-28 DIAGNOSIS — N1831 Stage 3a chronic kidney disease (CKD) (CMS-HCC): Principal | ICD-10-CM

## 2023-11-14 DIAGNOSIS — Z940 Kidney transplant status: Principal | ICD-10-CM

## 2023-11-14 MED ORDER — AMLODIPINE 5 MG TABLET
ORAL_TABLET | Freq: Two times a day (BID) | ORAL | 3 refills | 90.00000 days | Status: CP
Start: 2023-11-14 — End: 2024-12-18

## 2023-11-14 MED ORDER — ZEPBOUND 2.5 MG/0.5 ML SUBCUTANEOUS PEN INJECTOR
SUBCUTANEOUS | 11 refills | 28.00000 days | Status: CP
Start: 2023-11-14 — End: ?

## 2023-11-16 ENCOUNTER — Ambulatory Visit: Admit: 2023-11-16 | Discharge: 2023-11-17 | Payer: BLUE CROSS/BLUE SHIELD

## 2024-01-03 DIAGNOSIS — L738 Other specified follicular disorders: Principal | ICD-10-CM

## 2024-01-03 DIAGNOSIS — L820 Inflamed seborrheic keratosis: Principal | ICD-10-CM

## 2024-01-03 DIAGNOSIS — D2239 Melanocytic nevi of other parts of face: Principal | ICD-10-CM

## 2024-01-25 ENCOUNTER — Ambulatory Visit: Admit: 2024-01-25 | Discharge: 2024-01-26 | Payer: BLUE CROSS/BLUE SHIELD

## 2024-02-26 DIAGNOSIS — Z940 Kidney transplant status: Principal | ICD-10-CM
